# Patient Record
Sex: Male | Born: 1940 | Race: White | Hispanic: No | Marital: Married | Smoking: Never smoker
Health system: Southern US, Community
[De-identification: ages and names within clinical notes are randomized; demographics above are authoritative.]

## PROBLEM LIST (undated history)

## (undated) DIAGNOSIS — R233 Spontaneous ecchymoses: Secondary | ICD-10-CM

## (undated) DIAGNOSIS — R238 Other skin changes: Secondary | ICD-10-CM

## (undated) DIAGNOSIS — I10 Essential (primary) hypertension: Secondary | ICD-10-CM

## (undated) DIAGNOSIS — I639 Cerebral infarction, unspecified: Secondary | ICD-10-CM

## (undated) DIAGNOSIS — K219 Gastro-esophageal reflux disease without esophagitis: Secondary | ICD-10-CM

## (undated) DIAGNOSIS — F419 Anxiety disorder, unspecified: Secondary | ICD-10-CM

## (undated) DIAGNOSIS — I219 Acute myocardial infarction, unspecified: Secondary | ICD-10-CM

## (undated) HISTORY — DX: Acute myocardial infarction, unspecified: I21.9

## (undated) HISTORY — DX: Essential (primary) hypertension: I10

## (undated) HISTORY — DX: Spontaneous ecchymoses: R23.3

## (undated) HISTORY — DX: Cerebral infarction, unspecified: I63.9

## (undated) HISTORY — DX: Gastro-esophageal reflux disease without esophagitis: K21.9

## (undated) HISTORY — DX: Anxiety disorder, unspecified: F41.9

## (undated) HISTORY — PX: GALLBLADDER SURGERY: SHX652

## (undated) HISTORY — DX: Other skin changes: R23.8

---

## 2011-03-25 DIAGNOSIS — E119 Type 2 diabetes mellitus without complications: Secondary | ICD-10-CM | POA: Insufficient documentation

## 2011-03-25 DIAGNOSIS — F419 Anxiety disorder, unspecified: Secondary | ICD-10-CM

## 2011-03-25 DIAGNOSIS — H9190 Unspecified hearing loss, unspecified ear: Secondary | ICD-10-CM | POA: Insufficient documentation

## 2011-03-25 DIAGNOSIS — I10 Essential (primary) hypertension: Secondary | ICD-10-CM

## 2011-03-25 DIAGNOSIS — K219 Gastro-esophageal reflux disease without esophagitis: Secondary | ICD-10-CM

## 2013-05-17 ENCOUNTER — Ambulatory Visit: Payer: Self-pay

## 2013-05-31 ENCOUNTER — Ambulatory Visit (INDEPENDENT_AMBULATORY_CARE_PROVIDER_SITE_OTHER): Payer: Medicare Other

## 2013-05-31 VITALS — BP 123/71 | HR 103 | Resp 16

## 2013-05-31 DIAGNOSIS — E1142 Type 2 diabetes mellitus with diabetic polyneuropathy: Secondary | ICD-10-CM

## 2013-05-31 DIAGNOSIS — B351 Tinea unguium: Secondary | ICD-10-CM

## 2013-05-31 DIAGNOSIS — E1140 Type 2 diabetes mellitus with diabetic neuropathy, unspecified: Secondary | ICD-10-CM

## 2013-05-31 DIAGNOSIS — M79609 Pain in unspecified limb: Secondary | ICD-10-CM

## 2013-05-31 DIAGNOSIS — E1149 Type 2 diabetes mellitus with other diabetic neurological complication: Secondary | ICD-10-CM

## 2013-05-31 NOTE — Patient Instructions (Signed)

## 2013-05-31 NOTE — Progress Notes (Signed)
  Subjective:    Patient ID: Jared [name], male    DOB: October 17, 1940, 72 y.o.   MRN: [id number]  HPI trim nails    Review of Systems  Constitutional: Negative.   HENT: Negative.   Eyes: Negative.   Respiratory: Negative.   Cardiovascular: Negative.   Gastrointestinal: Negative.   Endocrine: Negative.   Genitourinary: Negative.   Musculoskeletal: Negative.   Skin: Negative.   Allergic/Immunologic: Negative.   Neurological: Negative.   Hematological: Negative.   Psychiatric/Behavioral: Negative.        Objective:   Physical Exam Neurovascular status unchanged pedal pulses palpable DP +2/4 PT + over 4 bilateral. Skin temperature warm. There is +1 to +2 edema the left no edema on the right this is a new occurrence however his blood pressure has been normal no history of injury or trauma to the left foot or leg. No complaints of pain no erythema no crepitus on range of motion there exam. Nails thick and hypertrophic friable and criptotic with discoloration and brittle. Nails tender on palpation and with enclosed shoe or debridement. Skin color pigment normal hair growth absent bilateral. No open wounds or ulcerations noted. Mild varicosities noted.     Assessment & Plan:  Diabetes with peripheral neuropathy. Onychomycosis with dystrophy of nails 1 through 5 bilateral. Nails tender on palpation and debridement. Mycotic nails debrided x10. Return in 3 months for followup palliative care is needed. As far as the edema left foot may recommendations for compression stockings, if he continues to progress followup with primary physician.  Alvan Dame DPM

## 2013-09-06 ENCOUNTER — Ambulatory Visit: Payer: Medicare Other

## 2014-02-21 ENCOUNTER — Ambulatory Visit (INDEPENDENT_AMBULATORY_CARE_PROVIDER_SITE_OTHER): Payer: Medicare Other

## 2014-02-21 VITALS — BP 136/69 | HR 72 | Resp 12

## 2014-02-21 DIAGNOSIS — M79606 Pain in leg, unspecified: Secondary | ICD-10-CM

## 2014-02-21 DIAGNOSIS — E1149 Type 2 diabetes mellitus with other diabetic neurological complication: Secondary | ICD-10-CM

## 2014-02-21 DIAGNOSIS — B351 Tinea unguium: Secondary | ICD-10-CM

## 2014-02-21 DIAGNOSIS — M79609 Pain in unspecified limb: Secondary | ICD-10-CM

## 2014-02-21 DIAGNOSIS — E1140 Type 2 diabetes mellitus with diabetic neuropathy, unspecified: Secondary | ICD-10-CM

## 2014-02-21 NOTE — Patient Instructions (Signed)
Diabetes and Foot Care Diabetes may cause you to have problems because of poor blood supply (circulation) to your feet and legs. This may cause the skin on your feet to become thinner, break easier, and heal more slowly. Your skin may become dry, and the skin may peel and crack. You may also have nerve damage in your legs and feet causing decreased feeling in them. You may not notice minor injuries to your feet that could lead to infections or more serious problems. Taking care of your feet is one of the most important things you can do for yourself.  HOME CARE INSTRUCTIONS  Wear shoes at all times, even in the house. Do not go barefoot. Bare feet are easily injured.  Check your feet daily for blisters, cuts, and redness. If you cannot see the bottom of your feet, use a mirror or ask someone for help.  Wash your feet with warm water (do not use hot water) and mild soap. Then pat your feet and the areas between your toes until they are completely dry. Do not soak your feet as this can dry your skin.  Apply a moisturizing lotion or petroleum jelly (that does not contain alcohol and is unscented) to the skin on your feet and to dry, brittle toenails. Do not apply lotion between your toes.  Trim your toenails straight across. Do not dig under them or around the cuticle. File the edges of your nails with an emery board or nail file.  Do not cut corns or calluses or try to remove them with medicine.  Wear clean socks or stockings every day. Make sure they are not too tight. Do not wear knee-high stockings since they may decrease blood flow to your legs.  Wear shoes that fit properly and have enough cushioning. To break in new shoes, wear them for just a few hours a day. This prevents you from injuring your feet. Always look in your shoes before you put them on to be sure there are no objects inside.  Do not cross your legs. This may decrease the blood flow to your feet.  If you find a minor scrape,  cut, or break in the skin on your feet, keep it and the skin around it clean and dry. These areas may be cleansed with mild soap and water. Do not cleanse the area with peroxide, alcohol, or iodine.  When you remove an adhesive bandage, be sure not to damage the skin around it.  If you have a wound, look at it several times a day to make sure it is healing.  Do not use heating pads or hot water bottles. They may burn your skin. If you have lost feeling in your feet or legs, you may not know it is happening until it is too late.  Make sure your health care provider performs a complete foot exam at least annually or more often if you have foot problems. Report any cuts, sores, or bruises to your health care provider immediately. SEEK MEDICAL CARE IF:   You have an injury that is not healing.  You have cuts or breaks in the skin.  You have an ingrown nail.  You notice redness on your legs or feet.  You feel burning or tingling in your legs or feet.  You have pain or cramps in your legs and feet.  Your legs or feet are numb.  Your feet always feel cold. SEEK IMMEDIATE MEDICAL CARE IF:   There is increasing redness,   swelling, or pain in or around a wound.  There is a red line that goes up your leg.  Pus is coming from a wound.  You develop a fever or as directed by your health care provider.  You notice a bad smell coming from an ulcer or wound. Document Released: 07/30/2000 Document Revised: 04/04/2013 Document Reviewed: 01/09/2013 Exit[employer] Patient Information 2015 [employer]. This information is not intended to replace advice given to you by your health care provider. Make sure you discuss any questions you have with your health care provider.  

## 2014-02-21 NOTE — Progress Notes (Signed)
   Subjective:    Patient ID: Jared [name], male    DOB: May 04, 1941, 73 y.o.   MRN: [id number]  HPI I need my toenails trimmed up    Review of Systems no new findings or systemic changes noted     Objective:   Physical Exam Lower extremity objective findings as follows vascular status is intact and unchanged pedal pulses DP +2/4 bilateral PT one over 4 bilateral capillary refill time 3 seconds all digits there is +1 mild edema noted both lower extremities. Minimal varicosities noted. Neurologically epicritic and proprioceptive sensations intact although diminished on Semmes Weinstein testing bilateral to the toes. There is normal plantar response DTRs not elicited dermatologically skin color pigment normal hair growth absent there is no rash noted around his ankle right ankle only medial lateral aspects of the ankle about 4-5 inches above the malleoli been there for couple of weeks mild pruritus no significant pain no discharge or drainage. Advised to try topicals cortisone cream OTC for week or 2 if no improvement recommend followup with dermatology or primary physician. Nails thick criptotic friable discolored 1 through 5 bilateral tender both on palpation and with enclosed shoe wear. No open wounds ulcerations no secondary infection is noted current time.       Assessment & Plan:  Assessment diabetes with history peripheral neuropathy as well as onychomycosis with dystrophy discoloration and friability of nails 1 through 5 bilateral painful tender mycotic nails are debrided and the presence of pain in symptomology as well as diabetes return for future palliative care as needed recommended 3 month followup  Alvan Dameichard Choua Ikner DPM

## 2014-05-16 ENCOUNTER — Ambulatory Visit: Payer: Medicare Other

## 2014-05-20 ENCOUNTER — Ambulatory Visit (INDEPENDENT_AMBULATORY_CARE_PROVIDER_SITE_OTHER): Payer: Medicare Other

## 2014-05-20 VITALS — BP 149/76 | HR 86 | Resp 12

## 2014-05-20 DIAGNOSIS — M79606 Pain in leg, unspecified: Secondary | ICD-10-CM

## 2014-05-20 DIAGNOSIS — B351 Tinea unguium: Secondary | ICD-10-CM

## 2014-05-20 DIAGNOSIS — E1140 Type 2 diabetes mellitus with diabetic neuropathy, unspecified: Secondary | ICD-10-CM

## 2014-05-20 NOTE — Patient Instructions (Signed)
Diabetes and Foot Care Diabetes may cause you to have problems because of poor blood supply (circulation) to your feet and legs. This may cause the skin on your feet to become thinner, break easier, and heal more slowly. Your skin may become dry, and the skin may peel and crack. You may also have nerve damage in your legs and feet causing decreased feeling in them. You may not notice minor injuries to your feet that could lead to infections or more serious problems. Taking care of your feet is one of the most important things you can do for yourself.  HOME CARE INSTRUCTIONS  Wear shoes at all times, even in the house. Do not go barefoot. Bare feet are easily injured.  Check your feet daily for blisters, cuts, and redness. If you cannot see the bottom of your feet, use a mirror or ask someone for help.  Wash your feet with warm water (do not use hot water) and mild soap. Then pat your feet and the areas between your toes until they are completely dry. Do not soak your feet as this can dry your skin.  Apply a moisturizing lotion or petroleum jelly (that does not contain alcohol and is unscented) to the skin on your feet and to dry, brittle toenails. Do not apply lotion between your toes.  Trim your toenails straight across. Do not dig under them or around the cuticle. File the edges of your nails with an emery board or nail file.  Do not cut corns or calluses or try to remove them with medicine.  Wear clean socks or stockings every day. Make sure they are not too tight. Do not wear knee-high stockings since they may decrease blood flow to your legs.  Wear shoes that fit properly and have enough cushioning. To break in new shoes, wear them for just a few hours a day. This prevents you from injuring your feet. Always look in your shoes before you put them on to be sure there are no objects inside.  Do not cross your legs. This may decrease the blood flow to your feet.  If you find a minor scrape,  cut, or break in the skin on your feet, keep it and the skin around it clean and dry. These areas may be cleansed with mild soap and water. Do not cleanse the area with peroxide, alcohol, or iodine.  When you remove an adhesive bandage, be sure not to damage the skin around it.  If you have a wound, look at it several times a day to make sure it is healing.  Do not use heating pads or hot water bottles. They may burn your skin. If you have lost feeling in your feet or legs, you may not know it is happening until it is too late.  Make sure your health care provider performs a complete foot exam at least annually or more often if you have foot problems. Report any cuts, sores, or bruises to your health care provider immediately. SEEK MEDICAL CARE IF:   You have an injury that is not healing.  You have cuts or breaks in the skin.  You have an ingrown nail.  You notice redness on your legs or feet.  You feel burning or tingling in your legs or feet.  You have pain or cramps in your legs and feet.  Your legs or feet are numb.  Your feet always feel cold. SEEK IMMEDIATE MEDICAL CARE IF:   There is increasing redness,   swelling, or pain in or around a wound.  There is a red line that goes up your leg.  Pus is coming from a wound.  You develop a fever or as directed by your health care provider.  You notice a bad smell coming from an ulcer or wound. Document Released: 07/30/2000 Document Revised: 04/04/2013 Document Reviewed: 01/09/2013 Exit[employer] Patient Information 2015 [employer]. This information is not intended to replace advice given to you by your health care provider. Make sure you discuss any questions you have with your health care provider.  

## 2014-05-20 NOTE — Progress Notes (Signed)
   Subjective:    Patient ID: Jared [name], male    DOB: [birth year], 73 y.o.   MRN: [id number]  HPI TOENAILS TRIM  AND CHECK FEET.   Review of Systems no new findings or systemic changes noted     Objective:   Physical Exam No new findings or changes vascular status reveals DP plus and PT plus one over 4 bilateral 3 seconds capillary refill time. Mild + edema bilateral mild varicosities noted epicritic sensation decreased on Semmes Weinstein to forefoot and digits. No plantar calluses no open wounds or ulcers no secondary infections nails thick criptotic incurvated brittle discolored consistent with onychomycosis and proptosis. Patient blood sugars the problem it was 140 this morning normally going under 120 general seems to be continued well-managed. No new findings or changes mild semirigid digital contractures noted 2 through 5 bilateral       Assessment & Plan:  Assessment diabetes history peripheral neuropathy as well as onychomycosis dystrophy friability of nails 1 through 5 bilateral painful mycotic nails debrided x10 the second toe left is treated with lumicain Neosporin and Band-Aid dressing a cane for the next 24 hours recheck in 3 months for long-term palliative care and followup as needed next  Alvan Dameichard Kaprice Kage DPM

## 2014-08-26 ENCOUNTER — Ambulatory Visit (INDEPENDENT_AMBULATORY_CARE_PROVIDER_SITE_OTHER): Payer: Medicare Other

## 2014-08-26 VITALS — BP 156/79 | HR 64 | Resp 12

## 2014-08-26 DIAGNOSIS — E1140 Type 2 diabetes mellitus with diabetic neuropathy, unspecified: Secondary | ICD-10-CM

## 2014-08-26 DIAGNOSIS — B351 Tinea unguium: Secondary | ICD-10-CM

## 2014-08-26 DIAGNOSIS — M79606 Pain in leg, unspecified: Secondary | ICD-10-CM

## 2014-08-26 DIAGNOSIS — M79676 Pain in unspecified toe(s): Secondary | ICD-10-CM

## 2014-08-26 NOTE — Progress Notes (Signed)
   Subjective:    Patient ID: Jared SalvageRonald Mette, male    DOB: 1941/04/04, 74 y.o.   MRN: [id number]  HPI TOENAILS TRIM.   Review of Systems no new findings or systemic changes noted    Objective:   Physical Exam Lower extremity objective findings unchanged pedal pulses DP and PT plus one over 4 bilateral Refill time 3 seconds mild +1 edema noted no open wounds no ulcers no secondary infections. Nails thick brittle criptotic incurvated friable 1 through 5 bilateral       Assessment & Plan:  Assessment this time diabetes with history peripheral neuropathy as well as dystrophy and discoloration friability mycosis of nails 1 through 5 bilateral painful mycotic nails debrided 10 return for future palliative care every 3 months as recommended  Alvan Dameichard Nakya Weyand DPM

## 2014-11-25 ENCOUNTER — Ambulatory Visit: Payer: Medicare Other

## 2015-05-26 ENCOUNTER — Ambulatory Visit (INDEPENDENT_AMBULATORY_CARE_PROVIDER_SITE_OTHER): Payer: Medicare Other | Admitting: Podiatry

## 2015-05-26 ENCOUNTER — Encounter: Payer: Self-pay | Admitting: Podiatry

## 2015-05-26 DIAGNOSIS — M79673 Pain in unspecified foot: Secondary | ICD-10-CM

## 2015-05-26 DIAGNOSIS — B351 Tinea unguium: Secondary | ICD-10-CM

## 2015-05-26 DIAGNOSIS — E1140 Type 2 diabetes mellitus with diabetic neuropathy, unspecified: Secondary | ICD-10-CM | POA: Diagnosis not present

## 2015-05-26 DIAGNOSIS — M79606 Pain in leg, unspecified: Secondary | ICD-10-CM

## 2015-05-26 NOTE — Progress Notes (Signed)
Patient ID: Jared [name], male   DOB: 02/24/1941, 74 y.o.   MRN: [id number] Complaint:  Visit Type: Patient returns to my office for continued preventative foot care services. Complaint: Patient states" my nails have grown long and thick and become painful to walk and wear shoes" Patient has been diagnosed with DM with no foot complications. The patient presents for preventative foot care services. No changes to ROS  Podiatric Exam: Vascular: dorsalis pedis and posterior tibial pulses are palpable bilateral. Capillary return is immediate. Temperature gradient is diminished.. Skin turgor WNL  Sensorium: Normal Semmes Weinstein monofilament test. Normal tactile sensation bilaterally. Nail Exam: Pt has thick disfigured discolored nails with subungual debris noted bilateral entire nail hallux through fifth toenails Ulcer Exam: There is no evidence of ulcer or pre-ulcerative changes or infection. Orthopedic Exam: Muscle tone and strength are WNL. No limitations in general ROM. No crepitus or effusions noted. Foot type and digits show no abnormalities. Bony prominences are unremarkable. Skin: No Porokeratosis. No infection or ulcers  Diagnosis:  Onychomycosis, , Pain in right toe, pain in left toes  Treatment & Plan Procedures and Treatment: Consent by patient was obtained for treatment procedures. The patient understood the discussion of treatment and procedures well. All questions were answered thoroughly reviewed. Debridement of mycotic and hypertrophic toenails, 1 through 5 bilateral and clearing of subungual debris. No ulceration, no infection noted.  Return Visit-Office Procedure: Patient instructed to return to the office for a follow up visit 3 months for continued evaluation and treatment.

## 2015-09-01 ENCOUNTER — Ambulatory Visit (INDEPENDENT_AMBULATORY_CARE_PROVIDER_SITE_OTHER): Payer: Medicare Other | Admitting: Podiatry

## 2015-09-01 ENCOUNTER — Encounter: Payer: Self-pay | Admitting: Podiatry

## 2015-09-01 DIAGNOSIS — M79606 Pain in leg, unspecified: Secondary | ICD-10-CM

## 2015-09-01 DIAGNOSIS — E1140 Type 2 diabetes mellitus with diabetic neuropathy, unspecified: Secondary | ICD-10-CM

## 2015-09-01 DIAGNOSIS — M79673 Pain in unspecified foot: Secondary | ICD-10-CM | POA: Diagnosis not present

## 2015-09-01 DIAGNOSIS — B351 Tinea unguium: Secondary | ICD-10-CM | POA: Diagnosis not present

## 2015-09-01 NOTE — Progress Notes (Signed)
Patient ID: Jared SalvageRonald Crite, male   DOB: 03-11-1941, 75 y.o.   MRN: [id number] Complaint:  Visit Type: Patient returns to my office for continued preventative foot care services. Complaint: Patient states" my nails have grown long and thick and become painful to walk and wear shoes" Patient has been diagnosed with DM with no foot complications. The patient presents for preventative foot care services. No changes to ROS  Podiatric Exam: Vascular: dorsalis pedis and posterior tibial pulses are palpable bilateral. Capillary return is immediate. Temperature gradient is diminished.. Skin turgor WNL  Sensorium: Normal Semmes Weinstein monofilament test. Normal tactile sensation bilaterally. Nail Exam: Pt has thick disfigured discolored nails with subungual debris noted bilateral entire nail hallux through fifth toenails Ulcer Exam: There is no evidence of ulcer or pre-ulcerative changes or infection. Orthopedic Exam: Muscle tone and strength are WNL. No limitations in general ROM. No crepitus or effusions noted. Foot type and digits show no abnormalities. Bony prominences are unremarkable. Skin: No Porokeratosis. No infection or ulcers  Diagnosis:  Onychomycosis, , Pain in right toe, pain in left toes  Treatment & Plan Procedures and Treatment: Consent by patient was obtained for treatment procedures. The patient understood the discussion of treatment and procedures well. All questions were answered thoroughly reviewed. Debridement of mycotic and hypertrophic toenails, 1 through 5 bilateral and clearing of subungual debris. No ulceration, no infection noted.  Return Visit-Office Procedure: Patient instructed to return to the office for a follow up visit 3 months for continued evaluation and treatment.   Helane GuntherGregory Etha Stambaugh DPM

## 2015-12-01 ENCOUNTER — Encounter: Payer: Self-pay | Admitting: Podiatry

## 2015-12-01 ENCOUNTER — Ambulatory Visit (INDEPENDENT_AMBULATORY_CARE_PROVIDER_SITE_OTHER): Payer: Medicare Other | Admitting: Podiatry

## 2015-12-01 DIAGNOSIS — B351 Tinea unguium: Secondary | ICD-10-CM

## 2015-12-01 DIAGNOSIS — E1140 Type 2 diabetes mellitus with diabetic neuropathy, unspecified: Secondary | ICD-10-CM

## 2015-12-01 DIAGNOSIS — M79606 Pain in leg, unspecified: Secondary | ICD-10-CM | POA: Diagnosis not present

## 2015-12-01 NOTE — Progress Notes (Signed)
Patient ID: [name], male   DOB: May 11, 1941, 75 y.o.   MRN: [id number] Complaint:  Visit Type: Patient returns to my office for continued preventative foot care services. Complaint: Patient states" my nails have grown long and thick and become painful to walk and wear shoes" Patient has been diagnosed with DM with no foot complications. The patient presents for preventative foot care services. No changes to ROS  Podiatric Exam: Vascular: dorsalis pedis and posterior tibial pulses are palpable bilateral. Capillary return is immediate. Temperature gradient is diminished.. Skin turgor WNL  Sensorium: Normal Semmes Weinstein monofilament test. Normal tactile sensation bilaterally. Nail Exam: Pt has thick disfigured discolored nails with subungual debris noted bilateral entire nail hallux through fifth toenails Ulcer Exam: There is no evidence of ulcer or pre-ulcerative changes or infection. Orthopedic Exam: Muscle tone and strength are WNL. No limitations in general ROM. No crepitus or effusions noted. Foot type and digits show no abnormalities. Bony prominences are unremarkable. Skin: No Porokeratosis. No infection or ulcers  Diagnosis:  Onychomycosis, , Pain in right toe, pain in left toes  Treatment & Plan Procedures and Treatment: Consent by patient was obtained for treatment procedures. The patient understood the discussion of treatment and procedures well. All questions were answered thoroughly reviewed. Debridement of mycotic and hypertrophic toenails, 1 through 5 bilateral and clearing of subungual debris. No ulceration, no infection noted.  Return Visit-Office Procedure: Patient instructed to return to the office for a follow up visit 3 months for continued evaluation and treatment.   Helane GuntherGregory Turner Kunzman DPM

## 2016-01-21 DIAGNOSIS — E1165 Type 2 diabetes mellitus with hyperglycemia: Secondary | ICD-10-CM

## 2016-01-21 DIAGNOSIS — I5032 Chronic diastolic (congestive) heart failure: Secondary | ICD-10-CM

## 2016-01-21 DIAGNOSIS — S72002A Fracture of unspecified part of neck of left femur, initial encounter for closed fracture: Secondary | ICD-10-CM | POA: Diagnosis not present

## 2016-01-21 DIAGNOSIS — N179 Acute kidney failure, unspecified: Secondary | ICD-10-CM

## 2016-01-21 DIAGNOSIS — F0390 Unspecified dementia without behavioral disturbance: Secondary | ICD-10-CM

## 2016-01-21 DIAGNOSIS — I10 Essential (primary) hypertension: Secondary | ICD-10-CM

## 2016-01-22 DIAGNOSIS — N179 Acute kidney failure, unspecified: Secondary | ICD-10-CM | POA: Diagnosis not present

## 2016-01-22 DIAGNOSIS — I5032 Chronic diastolic (congestive) heart failure: Secondary | ICD-10-CM

## 2016-01-22 DIAGNOSIS — E1165 Type 2 diabetes mellitus with hyperglycemia: Secondary | ICD-10-CM

## 2016-01-22 DIAGNOSIS — I10 Essential (primary) hypertension: Secondary | ICD-10-CM

## 2016-01-22 DIAGNOSIS — F0390 Unspecified dementia without behavioral disturbance: Secondary | ICD-10-CM | POA: Diagnosis not present

## 2016-01-22 DIAGNOSIS — S72002A Fracture of unspecified part of neck of left femur, initial encounter for closed fracture: Secondary | ICD-10-CM

## 2016-01-23 DIAGNOSIS — I10 Essential (primary) hypertension: Secondary | ICD-10-CM | POA: Diagnosis not present

## 2016-01-23 DIAGNOSIS — N179 Acute kidney failure, unspecified: Secondary | ICD-10-CM | POA: Diagnosis not present

## 2016-01-23 DIAGNOSIS — J9602 Acute respiratory failure with hypercapnia: Secondary | ICD-10-CM

## 2016-01-23 DIAGNOSIS — I5032 Chronic diastolic (congestive) heart failure: Secondary | ICD-10-CM | POA: Diagnosis not present

## 2016-01-23 DIAGNOSIS — F0390 Unspecified dementia without behavioral disturbance: Secondary | ICD-10-CM | POA: Diagnosis not present

## 2016-01-24 ENCOUNTER — Inpatient Hospital Stay (HOSPITAL_COMMUNITY): Payer: Medicare Other

## 2016-01-24 ENCOUNTER — Inpatient Hospital Stay (HOSPITAL_COMMUNITY)
Admission: AD | Admit: 2016-01-24 | Discharge: 2016-02-11 | DRG: 208 | Disposition: A | Discharge status: Hospice | Payer: Medicare Other | Source: Other Acute Inpatient Hospital | Attending: Internal Medicine | Admitting: Internal Medicine

## 2016-01-24 DIAGNOSIS — E861 Hypovolemia: Secondary | ICD-10-CM | POA: Diagnosis not present

## 2016-01-24 DIAGNOSIS — Y95 Nosocomial condition: Secondary | ICD-10-CM | POA: Diagnosis not present

## 2016-01-24 DIAGNOSIS — R0602 Shortness of breath: Secondary | ICD-10-CM

## 2016-01-24 DIAGNOSIS — J9601 Acute respiratory failure with hypoxia: Secondary | ICD-10-CM | POA: Diagnosis not present

## 2016-01-24 DIAGNOSIS — I5033 Acute on chronic diastolic (congestive) heart failure: Secondary | ICD-10-CM | POA: Diagnosis present

## 2016-01-24 DIAGNOSIS — R509 Fever, unspecified: Secondary | ICD-10-CM

## 2016-01-24 DIAGNOSIS — I110 Hypertensive heart disease with heart failure: Secondary | ICD-10-CM | POA: Diagnosis not present

## 2016-01-24 DIAGNOSIS — E875 Hyperkalemia: Secondary | ICD-10-CM

## 2016-01-24 DIAGNOSIS — I69354 Hemiplegia and hemiparesis following cerebral infarction affecting left non-dominant side: Secondary | ICD-10-CM | POA: Diagnosis not present

## 2016-01-24 DIAGNOSIS — R0600 Dyspnea, unspecified: Secondary | ICD-10-CM | POA: Insufficient documentation

## 2016-01-24 DIAGNOSIS — T502X5A Adverse effect of carbonic-anhydrase inhibitors, benzothiadiazides and other diuretics, initial encounter: Secondary | ICD-10-CM | POA: Diagnosis present

## 2016-01-24 DIAGNOSIS — Z9911 Dependence on respirator [ventilator] status: Secondary | ICD-10-CM | POA: Diagnosis not present

## 2016-01-24 DIAGNOSIS — N5089 Other specified disorders of the male genital organs: Secondary | ICD-10-CM | POA: Diagnosis present

## 2016-01-24 DIAGNOSIS — E872 Acidosis: Secondary | ICD-10-CM | POA: Diagnosis present

## 2016-01-24 DIAGNOSIS — Z8249 Family history of ischemic heart disease and other diseases of the circulatory system: Secondary | ICD-10-CM

## 2016-01-24 DIAGNOSIS — R1310 Dysphagia, unspecified: Secondary | ICD-10-CM | POA: Diagnosis present

## 2016-01-24 DIAGNOSIS — J189 Pneumonia, unspecified organism: Secondary | ICD-10-CM | POA: Diagnosis not present

## 2016-01-24 DIAGNOSIS — E1165 Type 2 diabetes mellitus with hyperglycemia: Secondary | ICD-10-CM | POA: Diagnosis present

## 2016-01-24 DIAGNOSIS — I252 Old myocardial infarction: Secondary | ICD-10-CM | POA: Diagnosis not present

## 2016-01-24 DIAGNOSIS — J9690 Respiratory failure, unspecified, unspecified whether with hypoxia or hypercapnia: Secondary | ICD-10-CM

## 2016-01-24 DIAGNOSIS — Z4659 Encounter for fitting and adjustment of other gastrointestinal appliance and device: Secondary | ICD-10-CM

## 2016-01-24 DIAGNOSIS — Z87891 Personal history of nicotine dependence: Secondary | ICD-10-CM | POA: Diagnosis not present

## 2016-01-24 DIAGNOSIS — A419 Sepsis, unspecified organism: Secondary | ICD-10-CM | POA: Diagnosis not present

## 2016-01-24 DIAGNOSIS — Z515 Encounter for palliative care: Secondary | ICD-10-CM | POA: Diagnosis not present

## 2016-01-24 DIAGNOSIS — E46 Unspecified protein-calorie malnutrition: Secondary | ICD-10-CM | POA: Diagnosis present

## 2016-01-24 DIAGNOSIS — F0390 Unspecified dementia without behavioral disturbance: Secondary | ICD-10-CM

## 2016-01-24 DIAGNOSIS — I959 Hypotension, unspecified: Secondary | ICD-10-CM | POA: Diagnosis present

## 2016-01-24 DIAGNOSIS — F329 Major depressive disorder, single episode, unspecified: Secondary | ICD-10-CM | POA: Diagnosis present

## 2016-01-24 DIAGNOSIS — Z880 Allergy status to penicillin: Secondary | ICD-10-CM | POA: Diagnosis not present

## 2016-01-24 DIAGNOSIS — Z794 Long term (current) use of insulin: Secondary | ICD-10-CM | POA: Diagnosis not present

## 2016-01-24 DIAGNOSIS — R197 Diarrhea, unspecified: Secondary | ICD-10-CM | POA: Diagnosis not present

## 2016-01-24 DIAGNOSIS — I5032 Chronic diastolic (congestive) heart failure: Secondary | ICD-10-CM | POA: Diagnosis not present

## 2016-01-24 DIAGNOSIS — E860 Dehydration: Secondary | ICD-10-CM | POA: Diagnosis not present

## 2016-01-24 DIAGNOSIS — Z7401 Bed confinement status: Secondary | ICD-10-CM

## 2016-01-24 DIAGNOSIS — F419 Anxiety disorder, unspecified: Secondary | ICD-10-CM | POA: Diagnosis present

## 2016-01-24 DIAGNOSIS — E870 Hyperosmolality and hypernatremia: Secondary | ICD-10-CM | POA: Diagnosis present

## 2016-01-24 DIAGNOSIS — K219 Gastro-esophageal reflux disease without esophagitis: Secondary | ICD-10-CM | POA: Diagnosis not present

## 2016-01-24 DIAGNOSIS — E0865 Diabetes mellitus due to underlying condition with hyperglycemia: Secondary | ICD-10-CM | POA: Diagnosis not present

## 2016-01-24 DIAGNOSIS — I509 Heart failure, unspecified: Secondary | ICD-10-CM

## 2016-01-24 DIAGNOSIS — J690 Pneumonitis due to inhalation of food and vomit: Secondary | ICD-10-CM | POA: Diagnosis present

## 2016-01-24 DIAGNOSIS — H9190 Unspecified hearing loss, unspecified ear: Secondary | ICD-10-CM | POA: Diagnosis present

## 2016-01-24 DIAGNOSIS — Z66 Do not resuscitate: Secondary | ICD-10-CM | POA: Diagnosis present

## 2016-01-24 DIAGNOSIS — Z23 Encounter for immunization: Secondary | ICD-10-CM | POA: Diagnosis not present

## 2016-01-24 DIAGNOSIS — I10 Essential (primary) hypertension: Secondary | ICD-10-CM | POA: Diagnosis not present

## 2016-01-24 DIAGNOSIS — R502 Drug induced fever: Secondary | ICD-10-CM

## 2016-01-24 DIAGNOSIS — J811 Chronic pulmonary edema: Secondary | ICD-10-CM

## 2016-01-24 DIAGNOSIS — F0150 Vascular dementia without behavioral disturbance: Secondary | ICD-10-CM | POA: Diagnosis present

## 2016-01-24 DIAGNOSIS — Z7189 Other specified counseling: Secondary | ICD-10-CM | POA: Insufficient documentation

## 2016-01-24 DIAGNOSIS — N179 Acute kidney failure, unspecified: Secondary | ICD-10-CM | POA: Diagnosis present

## 2016-01-24 DIAGNOSIS — J810 Acute pulmonary edema: Secondary | ICD-10-CM | POA: Diagnosis not present

## 2016-01-24 DIAGNOSIS — Z79899 Other long term (current) drug therapy: Secondary | ICD-10-CM

## 2016-01-24 DIAGNOSIS — S72002D Fracture of unspecified part of neck of left femur, subsequent encounter for closed fracture with routine healing: Secondary | ICD-10-CM | POA: Diagnosis not present

## 2016-01-24 DIAGNOSIS — J9600 Acute respiratory failure, unspecified whether with hypoxia or hypercapnia: Secondary | ICD-10-CM

## 2016-01-24 DIAGNOSIS — J9621 Acute and chronic respiratory failure with hypoxia: Principal | ICD-10-CM | POA: Diagnosis present

## 2016-01-24 DIAGNOSIS — R451 Restlessness and agitation: Secondary | ICD-10-CM | POA: Diagnosis not present

## 2016-01-24 DIAGNOSIS — R5084 Febrile nonhemolytic transfusion reaction: Secondary | ICD-10-CM

## 2016-01-24 DIAGNOSIS — Z01818 Encounter for other preprocedural examination: Secondary | ICD-10-CM

## 2016-01-24 LAB — COMPREHENSIVE METABOLIC PANEL
ALBUMIN: 2.5 g/dL — AB (ref 3.5–5.0)
ALT: 11 U/L — ABNORMAL LOW (ref 17–63)
ANION GAP: 7 (ref 5–15)
AST: 12 U/L — ABNORMAL LOW (ref 15–41)
Alkaline Phosphatase: 46 U/L (ref 38–126)
BILIRUBIN TOTAL: 0.6 mg/dL (ref 0.3–1.2)
BUN: 61 mg/dL — ABNORMAL HIGH (ref 6–20)
CHLORIDE: 116 mmol/L — AB (ref 101–111)
CO2: 20 mmol/L — AB (ref 22–32)
CREATININE: 4.08 mg/dL — AB (ref 0.61–1.24)
Calcium: 8.1 mg/dL — ABNORMAL LOW (ref 8.9–10.3)
GFR, EST AFRICAN AMERICAN: 15 mL/min — AB (ref >60)
GFR, EST NON AFRICAN AMERICAN: 13 mL/min — AB (ref >60)
Glucose, Bld: 126 mg/dL — ABNORMAL HIGH (ref 65–99)
POTASSIUM: 5.2 mmol/L — AB (ref 3.5–5.1)
Sodium: 143 mmol/L (ref 135–145)
Total Protein: 5.9 g/dL — ABNORMAL LOW (ref 6.5–8.1)

## 2016-01-24 LAB — CBC
HCT: 31.7 % — ABNORMAL LOW (ref 39.0–52.0)
HEMOGLOBIN: 9.5 g/dL — AB (ref 13.0–17.0)
MCH: 29.8 pg (ref 26.0–34.0)
MCHC: 30 g/dL (ref 30.0–36.0)
MCV: 99.4 fL (ref 78.0–100.0)
PLATELETS: 149 10*3/uL — AB (ref 150–400)
RBC: 3.19 MIL/uL — AB (ref 4.22–5.81)
RDW: 16.1 % — ABNORMAL HIGH (ref 11.5–15.5)
WBC: 11.1 10*3/uL — AB (ref 4.0–10.5)

## 2016-01-24 LAB — GLUCOSE, CAPILLARY
GLUCOSE-CAPILLARY: 129 mg/dL — AB (ref 65–99)
Glucose-Capillary: 128 mg/dL — ABNORMAL HIGH (ref 65–99)

## 2016-01-24 LAB — PHOSPHORUS: PHOSPHORUS: 5.5 mg/dL — AB (ref 2.5–4.6)

## 2016-01-24 LAB — BRAIN NATRIURETIC PEPTIDE: B NATRIURETIC PEPTIDE 5: 243.6 pg/mL — AB (ref 0.0–100.0)

## 2016-01-24 LAB — MAGNESIUM: MAGNESIUM: 1.9 mg/dL (ref 1.7–2.4)

## 2016-01-24 MED ORDER — VITAL HIGH PROTEIN PO LIQD
1000.0000 mL | ORAL | Status: DC
  Administered 2016-01-24 – 2016-01-25 (×2): 1000 mL
  Administered 2016-01-26: 11:00:00
  Filled 2016-01-24 (×2): qty 1000

## 2016-01-24 MED ORDER — PHENYLEPHRINE HCL 10 MG/ML IJ SOLN
30.0000 ug/min | INTRAVENOUS | Status: DC
  Filled 2016-01-24: qty 1

## 2016-01-24 MED ORDER — FENTANYL CITRATE (PF) 100 MCG/2ML IJ SOLN
50.0000 ug | INTRAMUSCULAR | Status: DC | PRN
  Administered 2016-01-24 – 2016-01-26 (×5): 50 ug via INTRAVENOUS
  Filled 2016-01-24 (×6): qty 2

## 2016-01-24 MED ORDER — IMIPENEM-CILASTATIN 250 MG IV SOLR
250.0000 mg | Freq: Two times a day (BID) | INTRAVENOUS | Status: DC
  Administered 2016-01-25 – 2016-01-26 (×3): 250 mg via INTRAVENOUS
  Filled 2016-01-24 (×4): qty 250

## 2016-01-24 MED ORDER — PANTOPRAZOLE SODIUM 40 MG PO PACK
40.0000 mg | PACK | Freq: Every day | ORAL | Status: DC
  Administered 2016-01-24 – 2016-01-28 (×5): 40 mg
  Filled 2016-01-24 (×6): qty 20

## 2016-01-24 MED ORDER — PRO-STAT SUGAR FREE PO LIQD
30.0000 mL | Freq: Two times a day (BID) | ORAL | Status: DC
  Administered 2016-01-24 – 2016-01-26 (×4): 30 mL
  Filled 2016-01-24 (×5): qty 30

## 2016-01-24 MED ORDER — SODIUM CHLORIDE 0.9 % IV SOLN
500.0000 mg | Freq: Once | INTRAVENOUS | Status: AC
  Administered 2016-01-24: 500 mg via INTRAVENOUS
  Filled 2016-01-24: qty 500

## 2016-01-24 MED ORDER — IPRATROPIUM-ALBUTEROL 0.5-2.5 (3) MG/3ML IN SOLN
3.0000 mL | Freq: Four times a day (QID) | RESPIRATORY_TRACT | Status: DC
  Administered 2016-01-24 (×2): 3 mL via RESPIRATORY_TRACT
  Filled 2016-01-24 (×2): qty 3

## 2016-01-24 MED ORDER — VANCOMYCIN HCL 10 G IV SOLR
2000.0000 mg | Freq: Once | INTRAVENOUS | Status: AC
  Administered 2016-01-24: 2000 mg via INTRAVENOUS
  Filled 2016-01-24: qty 2000

## 2016-01-24 MED ORDER — ANTISEPTIC ORAL RINSE SOLUTION (CORINZ)
7.0000 mL | Freq: Four times a day (QID) | OROMUCOSAL | Status: DC
  Administered 2016-01-25 (×2): 7 mL via OROMUCOSAL

## 2016-01-24 MED ORDER — HEPARIN SODIUM (PORCINE) 5000 UNIT/ML IJ SOLN
5000.0000 [IU] | Freq: Three times a day (TID) | INTRAMUSCULAR | Status: DC
  Administered 2016-01-24 – 2016-02-11 (×52): 5000 [IU] via SUBCUTANEOUS
  Filled 2016-01-24 (×47): qty 1

## 2016-01-24 MED ORDER — FUROSEMIDE 10 MG/ML IJ SOLN
60.0000 mg | Freq: Once | INTRAMUSCULAR | Status: AC
  Administered 2016-01-24: 60 mg via INTRAVENOUS
  Filled 2016-01-24: qty 6

## 2016-01-24 MED ORDER — FENTANYL CITRATE (PF) 100 MCG/2ML IJ SOLN
50.0000 ug | INTRAMUSCULAR | Status: DC | PRN
  Administered 2016-01-24: 50 ug via INTRAVENOUS

## 2016-01-24 MED ORDER — CHLORHEXIDINE GLUCONATE 0.12% ORAL RINSE (MEDLINE KIT)
15.0000 mL | Freq: Two times a day (BID) | OROMUCOSAL | Status: DC
  Administered 2016-01-24 – 2016-01-25 (×2): 15 mL via OROMUCOSAL

## 2016-01-24 NOTE — Progress Notes (Signed)
RT note-rate increased per Dr. Kendrick FriesMcQuaid

## 2016-01-24 NOTE — H&P (Signed)
PULMONARY / CRITICAL CARE MEDICINE   Name: Jared SalvageRonald Holsopple MRN: [id number] DOB: [birth year]    ADMISSION DATE:  01/24/2016  REFERRING MD:  [facility]   CHIEF COMPLAINT:  Acute respiratory failure, AKI   HISTORY OF PRESENT ILLNESS:  75yo male with hx dementia, DM, previous CVA with residual L sided weakness [facility] after a fall at home with L femoral neck fx which was surgically repaired.  Post op patient was unable to wean from vent.  Pt had progressive renal failure, volume overload and was tx to [facility] 6/10 for ongoing care and renal eval.    PAST MEDICAL HISTORY :  He  has a past medical history of High blood pressure; GERD (gastroesophageal reflux disease); Bruises easily; Anxiety; Hearing loss; Diabetes mellitus; Stroke [facility](HCC); and Heart attack (HCC).  PAST SURGICAL HISTORY: He  has past surgical history that includes Gallbladder surgery.  Allergies  Allergen Reactions  . Penicillins     No current facility-administered medications on file prior to encounter.   Current Outpatient Prescriptions on File Prior to Encounter  Medication Sig  . ALPRAZolam (XANAX) 0.5 MG tablet Take 0.5 mg by mouth at bedtime as needed.    . Aspirin-Dipyridamole (AGGRENOX PO) Take by mouth.    . citalopram (CELEXA) 10 MG tablet Take 10 mg by mouth daily.    . ENALAPRIL MALEATE PO Take by mouth.    Marland Kitchen. glimepiride (AMARYL) 4 MG tablet Take 4 mg by mouth daily before breakfast.    . METFORMIN HCL PO Take by mouth.    . metoprolol tartrate (LOPRESSOR) 25 MG tablet Take 25 mg by mouth 2 (two) times daily.    . niacin (NIASPAN) 500 MG CR tablet Take 500 mg by mouth at bedtime.    Marland Kitchen. PANTOPRAZOLE SODIUM PO Take by mouth.    Marland Kitchen. PRAVASTATIN SODIUM PO Take by mouth.    . QUEtiapine (SEROQUEL) 300 MG tablet Take 300 mg by mouth at bedtime.    . TAMSULOSIN HCL PO Take by mouth.     FAMILY HISTORY:  Family hx HTN   SOCIAL HISTORY: Former smoker, quit 2002   REVIEW OF  SYSTEMS:   As per HPI - All other systems reviewed and were neg.    SUBJECTIVE:    VITAL SIGNS: BP 107/58 mmHg  Pulse 78  Temp(Src) 100.3 F (37.9 C) (Oral)  Resp 16  Ht 6' (1.829 m)  SpO2 98%  HEMODYNAMICS:    VENTILATOR SETTINGS: Vent Mode:  [-] PRVC FiO2 (%):  [50 %] 50 % Set Rate:  [16 bmp-18 bmp] 18 bmp Vt Set:  [600 mL] 600 mL PEEP:  [5 cmH20] 5 cmH20 Plateau Pressure:  [22 cmH20] 22 cmH20  INTAKE / OUTPUT:    PHYSICAL EXAMINATION: General:  Elderly, chronically ill appearing male, NAD on vent  Neuro:  Sedated on vent, RASS -1, opens eyes to name, follows commands intermittently  HEENT:  Mm moist, no JVD, ETT  Cardiovascular:  s1s2 rrr Lungs:  resps even non labored on vent, few bibasilar crackles Abdomen:  Round, soft, +bs  Musculoskeletal:  Warm and dry, no edema   LABS:  BMET No results for input(s): NA, K, CL, CO2, BUN, CREATININE, GLUCOSE in the last 168 hours.  Electrolytes No results for input(s): CALCIUM, MG, PHOS in the last 168 hours.  CBC No results for input(s): WBC, HGB, HCT, PLT in the last 168 hours.  Coag's No results for input(s): APTT, INR in the last 168  hours.  Sepsis Markers No results for input(s): LATICACIDVEN, PROCALCITON, O2SATVEN in the last 168 hours.  ABG No results for input(s): PHART, PCO2ART, PO2ART in the last 168 hours.  Liver Enzymes No results for input(s): AST, ALT, ALKPHOS, BILITOT, ALBUMIN in the last 168 hours.  Cardiac Enzymes No results for input(s): TROPONINI, PROBNP in the last 168 hours.  Glucose No results for input(s): GLUCAP in the last 168 hours.  Imaging No results found.   STUDIES:    CULTURES: BC x 2 6/10>>>  Urine 6/10>>>  ANTIBIOTICS: Vanc 6/10>>> Imipenem 6/10>>>  SIGNIFICANT EVENTS: 6/8>>>Surgical repair L hip fx  6/10>> tx [facility]   LINES/TUBES: ETT 6/8>>>   DISCUSSION: 75yo male with hx dementia admitted to [facility] after a fall at home with L hip fx.  Unable to wean  from vent post op.  Course c/b acute renal failure, volume overload.    ASSESSMENT / PLAN:  PULMONARY Acute respiratory failure  Pulmonary edema  P:   Vent support - 8cc/kg  F/u CXR  F/u ABG  Attempt diuresis as below  BD's   CARDIOVASCULAR Acute on chronic CHF  Hypotension  P:  Echo now  Attempt diuresis -- lasix  IV once now  Add low dose neo gtt if needed to allow diuresis  BNP    RENAL AKI  Hyperkalemia - mild  P:   Repeat chem now  Monitor closely with attempts at diuresis  If worsening Scr or no effective diuresis with lasix will consult renal in am   GASTROINTESTINAL No active issue  P:   PPI  TF   HEMATOLOGIC No active issue - labs pending  P:  Cbc  SQ heparin    INFECTIOUS ?HCAP  PCN allergic  P:   Broad spectrum abx  - vanc, imipenem  Pct  BC, urine culture   ENDOCRINE Hyperglycemia    P:   Monitor glucose on chem  May need SSI   NEUROLOGIC Sedation needs on vent  Hx dementia  P:   RASS goal: -1 PRN fentanyl    FAMILY  - Updates: no family available 76/10  - Inter-disciplinary family meet or Palliative Care meeting due by:  6/17    Dirk Dress, NP 01/24/2016  4:31 PM Pager: [phone] or [phone]

## 2016-01-25 ENCOUNTER — Inpatient Hospital Stay (HOSPITAL_COMMUNITY): Payer: Medicare Other

## 2016-01-25 DIAGNOSIS — R0600 Dyspnea, unspecified: Secondary | ICD-10-CM

## 2016-01-25 LAB — ECHOCARDIOGRAM COMPLETE
EWDT: 158 ms
FS: 33 % (ref 28–44)
Height: 72 in
IV/PV OW: 1.05
LA ID, A-P, ES: 39 mm
LA diam index: 1.74 cm/m2
LA vol A4C: 44.9 ml
LA vol index: 19.9 mL/m2
LA vol: 44.5 mL
LEFT ATRIUM END SYS DIAM: 39 mm
LVOT area: 3.8 cm2
LVOT diameter: 22 mm
MV Dec: 158
MV Peak grad: 3 mmHg
MVPKAVEL: 53.3 m/s
MVPKEVEL: 90.6 m/s
PW: 9.91 mm — AB (ref 0.6–1.1)
TAPSE: 18.3 mm
Weight: 3615.54 oz

## 2016-01-25 LAB — BLOOD GAS, ARTERIAL
ACID-BASE DEFICIT: 10.2 mmol/L — AB (ref 0.0–2.0)
BICARBONATE: 15.6 meq/L — AB (ref 20.0–24.0)
Drawn by: 398661
FIO2: 0.4
LHR: 18 {breaths}/min
O2 SAT: 90.6 %
PCO2 ART: 40 mmHg (ref 35.0–45.0)
PEEP: 5 cmH2O
Patient temperature: 101.5
TCO2: 16.8 mmol/L (ref 0–100)
VT: 600 mL
pH, Arterial: 7.228 — ABNORMAL LOW (ref 7.350–7.450)
pO2, Arterial: 75.8 mmHg — ABNORMAL LOW (ref 80.0–100.0)

## 2016-01-25 LAB — GLUCOSE, CAPILLARY
GLUCOSE-CAPILLARY: 309 mg/dL — AB (ref 65–99)
GLUCOSE-CAPILLARY: 299 mg/dL — AB (ref 65–99)
Glucose-Capillary: 196 mg/dL — ABNORMAL HIGH (ref 65–99)
Glucose-Capillary: 273 mg/dL — ABNORMAL HIGH (ref 65–99)
Glucose-Capillary: 293 mg/dL — ABNORMAL HIGH (ref 65–99)
Glucose-Capillary: 309 mg/dL — ABNORMAL HIGH (ref 65–99)
Glucose-Capillary: 327 mg/dL — ABNORMAL HIGH (ref 65–99)

## 2016-01-25 LAB — MAGNESIUM
MAGNESIUM: 1.9 mg/dL (ref 1.7–2.4)
Magnesium: 1.9 mg/dL (ref 1.7–2.4)

## 2016-01-25 LAB — BASIC METABOLIC PANEL
ANION GAP: 10 (ref 5–15)
BUN: 68 mg/dL — ABNORMAL HIGH (ref 6–20)
CALCIUM: 8.5 mg/dL — AB (ref 8.9–10.3)
CHLORIDE: 116 mmol/L — AB (ref 101–111)
CO2: 17 mmol/L — AB (ref 22–32)
Creatinine, Ser: 3.89 mg/dL — ABNORMAL HIGH (ref 0.61–1.24)
GFR calc non Af Amer: 14 mL/min — ABNORMAL LOW (ref >60)
GFR, EST AFRICAN AMERICAN: 16 mL/min — AB (ref >60)
Glucose, Bld: 292 mg/dL — ABNORMAL HIGH (ref 65–99)
POTASSIUM: 5.4 mmol/L — AB (ref 3.5–5.1)
Sodium: 143 mmol/L (ref 135–145)

## 2016-01-25 LAB — CBC
HEMATOCRIT: 31.8 % — AB (ref 39.0–52.0)
HEMOGLOBIN: 9.6 g/dL — AB (ref 13.0–17.0)
MCH: 29.4 pg (ref 26.0–34.0)
MCHC: 30.2 g/dL (ref 30.0–36.0)
MCV: 97.5 fL (ref 78.0–100.0)
Platelets: 150 10*3/uL (ref 150–400)
RBC: 3.26 MIL/uL — AB (ref 4.22–5.81)
RDW: 16.2 % — ABNORMAL HIGH (ref 11.5–15.5)
WBC: 10.8 10*3/uL — ABNORMAL HIGH (ref 4.0–10.5)

## 2016-01-25 LAB — MRSA PCR SCREENING: MRSA by PCR: NEGATIVE

## 2016-01-25 LAB — PHOSPHORUS
Phosphorus: 5.4 mg/dL — ABNORMAL HIGH (ref 2.5–4.6)
Phosphorus: 3.7 mg/dL (ref 2.5–4.6)

## 2016-01-25 MED ORDER — ACETAMINOPHEN 160 MG/5ML PO SOLN
650.0000 mg | ORAL | Status: DC | PRN
Start: 2016-01-25 — End: 2016-02-01
  Administered 2016-01-25: 650 mg
  Filled 2016-01-25 (×2): qty 20.3

## 2016-01-25 MED ORDER — IPRATROPIUM-ALBUTEROL 0.5-2.5 (3) MG/3ML IN SOLN
3.0000 mL | Freq: Four times a day (QID) | RESPIRATORY_TRACT | Status: DC
  Administered 2016-01-25 – 2016-01-27 (×9): 3 mL via RESPIRATORY_TRACT
  Filled 2016-01-25 (×9): qty 3

## 2016-01-25 MED ORDER — METOPROLOL TARTRATE 25 MG/10 ML ORAL SUSPENSION
12.5000 mg | Freq: Every morning | ORAL | Status: DC
  Administered 2016-01-25 – 2016-01-28 (×4): 12.5 mg
  Filled 2016-01-25 (×4): qty 5

## 2016-01-25 MED ORDER — CHLORHEXIDINE GLUCONATE 0.12% ORAL RINSE (MEDLINE KIT)
15.0000 mL | Freq: Two times a day (BID) | OROMUCOSAL | Status: DC
  Administered 2016-01-25 – 2016-01-26 (×3): 15 mL via OROMUCOSAL

## 2016-01-25 MED ORDER — INSULIN ASPART 100 UNIT/ML ~~LOC~~ SOLN
0.0000 [IU] | SUBCUTANEOUS | Status: DC
  Administered 2016-01-25: 7 [IU] via SUBCUTANEOUS
  Administered 2016-01-25: 5 [IU] via SUBCUTANEOUS
  Administered 2016-01-25 (×2): 7 [IU] via SUBCUTANEOUS
  Administered 2016-01-25 (×2): 5 [IU] via SUBCUTANEOUS
  Administered 2016-01-26: 3 [IU] via SUBCUTANEOUS
  Administered 2016-01-26: 9 [IU] via SUBCUTANEOUS
  Administered 2016-01-26: 3 [IU] via SUBCUTANEOUS
  Administered 2016-01-26 (×2): 5 [IU] via SUBCUTANEOUS
  Administered 2016-01-27: 3 [IU] via SUBCUTANEOUS
  Administered 2016-01-27: 2 [IU] via SUBCUTANEOUS
  Administered 2016-01-27: 3 [IU] via SUBCUTANEOUS
  Administered 2016-01-27: 2 [IU] via SUBCUTANEOUS
  Administered 2016-01-27: 5 [IU] via SUBCUTANEOUS
  Administered 2016-01-27: 7 [IU] via SUBCUTANEOUS
  Administered 2016-01-28: 3 [IU] via SUBCUTANEOUS
  Administered 2016-01-28: 5 [IU] via SUBCUTANEOUS
  Administered 2016-01-28: 7 [IU] via SUBCUTANEOUS

## 2016-01-25 MED ORDER — PNEUMOCOCCAL VAC POLYVALENT 25 MCG/0.5ML IJ INJ
0.5000 mL | INJECTION | INTRAMUSCULAR | Status: AC
  Administered 2016-01-26: 0.5 mL via INTRAMUSCULAR
  Filled 2016-01-25: qty 0.5

## 2016-01-25 MED ORDER — ANTISEPTIC ORAL RINSE SOLUTION (CORINZ)
7.0000 mL | OROMUCOSAL | Status: DC
  Administered 2016-01-25 – 2016-01-26 (×12): 7 mL via OROMUCOSAL

## 2016-01-25 MED ORDER — PERFLUTREN LIPID MICROSPHERE
1.0000 mL | INTRAVENOUS | Status: AC | PRN
  Administered 2016-01-25: 5 mL via INTRAVENOUS
  Filled 2016-01-25: qty 10

## 2016-01-25 NOTE — Progress Notes (Signed)
PULMONARY / CRITICAL CARE MEDICINE   Name: Jared SalvageRonald Margulies MRN: [id number] DOB: September 13, 1940    ADMISSION DATE:  01/24/2016  REFERRING MD:  [facility]   CHIEF COMPLAINT:  Acute respiratory failure, AKI   BRIEF:  75yo male with hx dementia, DM, previous CVA with residual L sided weakness initially [facility] 6[facility] after a fall at home with L femoral neck fx which was surgically repaired.  Post op patient was unable to wean from vent.  Pt had progressive renal failure, volume overload and was tx to [facility] 6/10 for ongoing care and renal eval.    SUBJECTIVE:  Making urine   VITAL SIGNS: BP 136/77 mmHg  Pulse 106  Temp(Src) 100.4 F (38 C) (Oral)  Resp 19  Ht 6' (1.829 m)  Wt 102.5 kg (225 lb 15.5 oz)  BMI 30.64 kg/m2  SpO2 98%  HEMODYNAMICS:    VENTILATOR SETTINGS: Vent Mode:  [-] PSV;CPAP FiO2 (%):  [40 %-50 %] 40 % Set Rate:  [16 bmp-24 bmp] 24 bmp Vt Set:  [600 mL] 600 mL PEEP:  [5 cmH20] 5 cmH20 Pressure Support:  [10 cmH20] 10 cmH20 Plateau Pressure:  [10 cmH20-22 cmH20] 10 cmH20  INTAKE / OUTPUT: I/O last 3 completed shifts: In: 895.3 [NG/GT:295.3; IV Piggyback:600] Out: 1250 [Urine:1250]  PHYSICAL EXAMINATION: General:  Agitated in chair on vent HENT: NCAT ETT in place PULM: CTA B with vent supported breaths CV: RRR, no mgr GI: BS+, soft, nontender MSK: normal bulk/tone Derm: no rash or skin breakdown Neuro: confused, not following commands  LABS:  BMET  Recent Labs Lab 01/24/16 1633 01/25/16 0603  NA 143 143  K 5.2* 5.4*  CL 116* 116*  CO2 20* 17*  BUN 61* 68*  CREATININE 4.08* 3.89*  GLUCOSE 126* 292*    Electrolytes  Recent Labs Lab 01/24/16 1633 01/25/16 0603  CALCIUM 8.1* 8.5*  MG 1.9 1.9  PHOS 5.5* 5.4*    CBC  Recent Labs Lab 01/24/16 1633 01/25/16 0603  WBC 11.1* 10.8*  HGB 9.5* 9.6*  HCT 31.7* 31.8*  PLT 149* 150    Coag's No results for input(s): APTT, INR in the last 168 hours.  Sepsis  Markers No results for input(s): LATICACIDVEN, PROCALCITON, O2SATVEN in the last 168 hours.  ABG  Recent Labs Lab 01/25/16 0355  PHART 7.228*  PCO2ART 40.0  PO2ART 75.8*    Liver Enzymes  Recent Labs Lab 01/24/16 1633  AST 12*  ALT 11*  ALKPHOS 46  BILITOT 0.6  ALBUMIN 2.5*    Cardiac Enzymes No results for input(s): TROPONINI, PROBNP in the last 168 hours.  Glucose  Recent Labs Lab 01/24/16 1607 01/24/16 1946 01/25/16 0032 01/25/16 0418  GLUCAP 129* 128* 196* 309*    Imaging Dg Chest Port 1 View  01/25/2016  CLINICAL DATA:  Fever. EXAM: PORTABLE CHEST 1 VIEW COMPARISON:  January 24, 2016 FINDINGS: Stable support apparatus. No pneumothorax. The focal opacity in the medial right lung base has resolved. Minimal opacity in the bases may simply represent atelectasis. No other changes. IMPRESSION: Resolution of the focal opacity in the medial right lung base. Minimal residual opacity in the bases, likely atelectasis. Electronically Signed   By: Gerome Samavid  Williams III M.D   On: 01/25/2016 08:31   Dg Chest Port 1 View  01/24/2016  CLINICAL DATA:  Respiratory failure. EXAM: PORTABLE CHEST 1 VIEW COMPARISON:  None. FINDINGS: Endotracheal tube in satisfactory position. Nasogastric tube extending into the stomach. Mildly enlarged cardiac silhouette. Patchy opacity  in the right mid to lower lung zone, medially. There is also airspace opacity in the left lower lobe. Possible small left pleural effusion. Diffuse osteopenia. IMPRESSION: 1. Probable pneumonia in the right mid to lower lung zone, medially. A mass cannot be excluded and this will require follow-up. 2. Left lower lobe atelectasis or pneumonia. 3. Possible small left pleural effusion. 4. Mild cardiomegaly. Electronically Signed   By: Beckie Salts M.D.   On: 01/24/2016 17:08     STUDIES:    CULTURES: BC x 2 6/10>>>  Urine 6/10>>>  ANTIBIOTICS: Vanc 6/10>>> 6/11 Imipenem 6/10>>>  SIGNIFICANT EVENTS: 6/8>>>Surgical  repair L hip fx  6/10>> tx [facility]   LINES/TUBES: ETT 6/8>>>   DISCUSSION: 76yo male with hx dementia admitted to [facility] after a fall at home with L hip fx.  Unable to wean from vent post op.  Course c/b acute renal failure, volume overload.  Possible HCAP.  ASSESSMENT / PLAN:  PULMONARY Acute respiratory failure  Pulmonary edema  HCAP? P:   Vent support - 8cc/kg  Daily WUA/SBT Attempt diuresis as below  BD's   CARDIOVASCULAR Acute on chronic CHF  Hypotension > improved metabolic acidosis P:  F/U Echo Diuresis per renal Continue metoprolol BNP    RENAL AKI > UOP improving, Cr stable Hyperkalemia - mild  P:   Monitor BMET and UOP Replace electrolytes as needed Appreciate renal, will defer diuretics to them  GASTROINTESTINAL No active issue  P:   PPI  TF   HEMATOLOGIC No active issue - labs pending  P:  Cbc  SQ heparin    INFECTIOUS ?HCAP  PCN allergic  P:   Stop vanc today F/u culture  BC, urine culture   ENDOCRINE Hyperglycemia    P:   Monitor glucose on chem  SSI   NEUROLOGIC Sedation needs on vent  Hx dementia  P:   RASS goal: -1 PRN fentanyl    FAMILY  - Updates: no family available 6/11  - Inter-disciplinary family meet or Palliative Care meeting due by:  6/17  My cc time 35 minutes  Heber Harvey, MD [place] [facility] Pager: [phone] Cell: [phone] After 3pm or if no response, call [phone]

## 2016-01-25 NOTE — Progress Notes (Signed)
  Echocardiogram 2D Echocardiogram has been performed.  Marisue Humblelexis N Arraya Buck 01/25/2016, 12:23 PM

## 2016-01-25 NOTE — Progress Notes (Signed)
eLink Physician-Brief Progress Note Patient Name: Jared SalvageRonald [name] DOB: [birth year] MRN: [id number]   Date of Service  01/25/2016  HPI/Events of Note  Metabolic acidosis on ABG with pH 7.22/40/75/15.  Also with hyperglycemia.  eICU Interventions  Plan: Increase RR on vent to 24 Start SSI - sensitive scale q4 hours     Intervention Category Major Interventions: Acid-Base disturbance - evaluation and management Intermediate Interventions: Hyperglycemia - evaluation and treatment  Zilphia Kozinski 01/25/2016, 4:37 AM

## 2016-01-26 ENCOUNTER — Inpatient Hospital Stay (HOSPITAL_COMMUNITY): Payer: Medicare Other

## 2016-01-26 DIAGNOSIS — J9621 Acute and chronic respiratory failure with hypoxia: Secondary | ICD-10-CM | POA: Diagnosis not present

## 2016-01-26 LAB — URINE CULTURE: CULTURE: NO GROWTH

## 2016-01-26 LAB — BASIC METABOLIC PANEL
Anion gap: 8 (ref 5–15)
BUN: 62 mg/dL — AB (ref 6–20)
CHLORIDE: 122 mmol/L — AB (ref 101–111)
CO2: 21 mmol/L — ABNORMAL LOW (ref 22–32)
Calcium: 8.8 mg/dL — ABNORMAL LOW (ref 8.9–10.3)
Creatinine, Ser: 2.57 mg/dL — ABNORMAL HIGH (ref 0.61–1.24)
GFR calc Af Amer: 27 mL/min — ABNORMAL LOW (ref >60)
GFR, EST NON AFRICAN AMERICAN: 23 mL/min — AB (ref >60)
GLUCOSE: 273 mg/dL — AB (ref 65–99)
POTASSIUM: 3.8 mmol/L (ref 3.5–5.1)
Sodium: 151 mmol/L — ABNORMAL HIGH (ref 135–145)

## 2016-01-26 LAB — PHOSPHORUS: PHOSPHORUS: 3 mg/dL (ref 2.5–4.6)

## 2016-01-26 LAB — GLUCOSE, CAPILLARY
GLUCOSE-CAPILLARY: 257 mg/dL — AB (ref 65–99)
GLUCOSE-CAPILLARY: 460 mg/dL — AB (ref 65–99)
Glucose-Capillary: 248 mg/dL — ABNORMAL HIGH (ref 65–99)
Glucose-Capillary: 250 mg/dL — ABNORMAL HIGH (ref 65–99)
Glucose-Capillary: 266 mg/dL — ABNORMAL HIGH (ref 65–99)

## 2016-01-26 LAB — MAGNESIUM: Magnesium: 1.9 mg/dL (ref 1.7–2.4)

## 2016-01-26 MED ORDER — SODIUM CHLORIDE 0.9 % IV SOLN
250.0000 mg | Freq: Four times a day (QID) | INTRAVENOUS | Status: DC
  Administered 2016-01-26 – 2016-01-27 (×5): 250 mg via INTRAVENOUS
  Filled 2016-01-26 (×6): qty 250

## 2016-01-26 MED ORDER — CETYLPYRIDINIUM CHLORIDE 0.05 % MT LIQD: 7.0000 mL | Freq: Two times a day (BID) | OROMUCOSAL | Status: DC

## 2016-01-26 MED ORDER — CETYLPYRIDINIUM CHLORIDE 0.05 % MT LIQD
7.0000 mL | Freq: Two times a day (BID) | OROMUCOSAL | Status: DC
  Administered 2016-01-26: 7 mL via OROMUCOSAL

## 2016-01-26 MED FILL — Midazolam HCl Inj 5 MG/5ML (Base Equivalent): INTRAMUSCULAR | Qty: 10 | Status: AC

## 2016-01-26 NOTE — Progress Notes (Signed)
Pharmacy Antibiotic Note  Jared SalvageRonald [name] is a 75 y.o. male admitted on 01/24/2016 with ?HCAP.  Pharmacy has been consulted for imipenem dosing.  WBC 10.8 on 6/11, afebrile. Cr improving to 2.57 (CrCl ~30 ml/min).  Plan: Increase to imipenem 250 mg q6h  F/u cx, clinical status  Height: 6' (182.9 cm) Weight: 219 lb 12.8 oz (99.7 kg) IBW/kg (Calculated) : 77.6  Temp (24hrs), Avg:99.5 F (37.5 C), Min:97.7 F (36.5 C), Max:101.8 F (38.8 C)   Recent Labs Lab 01/24/16 1633 01/25/16 0603 01/26/16 0549  WBC 11.1* 10.8*  --   CREATININE 4.08* 3.89* 2.57*    Estimated Creatinine Clearance: 31.8 mL/min (by C-G formula based on Cr of 2.57).    Allergies  Allergen Reactions  . Penicillins Hives    Early childhood reaction - no other information available    Antimicrobials this admission: Imipenem 6/10 >>  Vancomycin 6/10 >> x1  Dose adjustments this admission: Imipenem 250 mg q12h increased to q6h on 6/12  Microbiology results: 6/10 BCx: pending 6/11 UCx: sent  6/10 MRSA PCR: neg  Thank you for allowing pharmacy to be a part of this patient's care.  Jared [name] 01/26/2016 11:40 AM

## 2016-01-26 NOTE — Progress Notes (Signed)
CBG 460 reported to Dr Delton CoombesByrum and 9 units given per orders tube feeding off

## 2016-01-26 NOTE — Progress Notes (Signed)
Initial Nutrition Assessment  DOCUMENTATION CODES:   Not applicable  INTERVENTION:    If unable to extubate, continue TF via OGT, change to Vital AF 1.2 at goal rate of 80 ml/h (1920 ml per day) to provide 2304 kcals, 144 gm protein, 1557 ml free water daily.  If able to extubate, will monitor for diet advancement and adequacy of oral intake.   NUTRITION DIAGNOSIS:   Inadequate oral intake related to inability to eat as evidenced by NPO status.  GOAL:   Patient will meet greater than or equal to 90% of their needs  MONITOR:   Vent status, Labs, Weight trends, TF tolerance, I & O's  REASON FOR ASSESSMENT:   Consult Enteral/tube feeding initiation and management  ASSESSMENT:   75yo male with hx dementia, DM, previous CVA with residual L sided weakness [facility] 6[facility] after a fall at home with L femoral neck fx which was surgically repaired. Post op patient was unable to wean from vent. Pt had progressive renal failure, volume overload and was tx to [facility] 6/10 for ongoing care and renal eval.   Labs reviewed: sodium elevated. Adult TF protocol was initiated on 6/10. Patient is currently receiving Vital High Protein via OGT at 40 ml/h (960 ml/day) with Prostat 30 ml BID to provide 1160 kcals, 114 gm protein, 803 ml free water daily.  Discussed patient in ICU rounds and with RN today. Plans for extubation today. TF to be held for extubation. Patient is currently intubated on ventilator support MV: 12.1 L/min Temp (24hrs), Avg:99.5 F (37.5 C), Min:97.7 F (36.5 C), Max:101.8 F (38.8 C)   Diet Order:  Diet NPO time specified  Skin:  Reviewed, no issues  Last BM:  unknown  Height:   Ht Readings from Last 1 Encounters:  01/24/16 6' (1.829 m)    Weight:   Wt Readings from Last 1 Encounters:  01/26/16 219 lb 12.8 oz (99.7 kg)    Ideal Body Weight:  80.9 kg  BMI:  Body mass index is 29.8 kg/(m^2).  Estimated Nutritional Needs:    Kcal:  2361  Protein:  120-140 gm  Fluid:  2.3 L  If successfully extubated today, estimated needs would be: 2000-2200 kcals, 110-130 gm protein, 2-2.2 L fluid daily.  EDUCATION NEEDS:   No education needs identified at this time  Joaquin CourtsKimberly Harris, RD, LDN, CNSC Pager [phone] After Hours Pager [phone]

## 2016-01-26 NOTE — Progress Notes (Signed)
   01/26/16 1200  Clinical Encounter Type  Visited With Patient;Health care provider  Visit Type Initial;Psychological support;Spiritual support;Social support  Referral From Care management  Consult/Referral To Chaplain  Spiritual Encounters  Spiritual Needs Emotional  Stress Factors  Patient Stress Factors None identified   Chaplain stopped in to check on Pt. / Pt. was still on the vent. Chaplain was told that Pt will come off the vent today; Chaplain will check in a little later.   Thanks,  Chaplain [employer]rice Trease [place]

## 2016-01-26 NOTE — Procedures (Signed)
Extubation Procedure Note  Patient Details:   Name: Jared [name] DOB: [birth year] MRN: [id number]   Airway Documentation:     Evaluation  O2 sats: stable throughout Complications: No apparent complications Patient did tolerate procedure well. Bilateral Breath Sounds: Clear   Yes   Patient extubated to 4lnc. Vital signs stable at this time. No complications. RN at bedside. RT will continue to monitor.  Ave Filterdkins, Abdurahman Rugg Williams 01/26/2016, 12:31 PM

## 2016-01-26 NOTE — Progress Notes (Signed)
PULMONARY / CRITICAL CARE MEDICINE   Name: Jared [name] MRN: [id number] DOB: 02-22-1941    ADMISSION DATE:  01/24/2016  [facility]  [facility]   CHIEF COMPLAINT:  Acute respiratory failure, AKI   BRIEF:  75yo male with hx dementia, DM, previous CVA with residual L sided weakness [facility] after a fall at home with L femoral neck fx which was surgically repaired.  Post op patient was unable to wean from vent.  Pt had progressive renal failure, volume overload and was tx to [facility] 6/10 for ongoing care and renal eval.    SUBJECTIVE:  Up to chair on MV Tolerating PSV Followed commands Mild agitation   VITAL SIGNS: BP 155/81 mmHg  Pulse 99  Temp(Src) 99.6 F (37.6 C) (Oral)  Resp 21  Ht 6' (1.829 m)  Wt 99.7 kg (219 lb 12.8 oz)  BMI 29.80 kg/m2  SpO2 97%  HEMODYNAMICS:    VENTILATOR SETTINGS: Vent Mode:  [-] PSV;CPAP FiO2 (%):  [40 %] 40 % Set Rate:  [24 bmp] 24 bmp Vt Set:  [600 mL] 600 mL PEEP:  [5 cmH20] 5 cmH20 Pressure Support:  [5 cmH20-10 cmH20] 5 cmH20 Plateau Pressure:  [16 cmH20-17 cmH20] 16 cmH20  INTAKE / OUTPUT: I/O last 3 completed shifts: In: 1695.3 [Other:200; NG/GT:1295.3; IV Piggyback:200] Out: 3260 [Urine:3260]  PHYSICAL EXAMINATION: General:  in chair on vent HENT: NCAT ETT in place PULM: CTA B with vent supported breaths CV: RRR, no mgr GI: BS+, soft, nontender MSK: normal bulk/tone Derm: no rash or skin breakdown Neuro: confused, not following commands  LABS:  BMET  Recent Labs Lab 01/24/16 1633 01/25/16 0603 01/26/16 0549  NA 143 143 151*  K 5.2* 5.4* 3.8  CL 116* 116* 122*  CO2 20* 17* 21*  BUN 61* 68* 62*  CREATININE 4.08* 3.89* 2.57*  GLUCOSE 126* 292* 273*    Electrolytes  Recent Labs Lab 01/24/16 1633 01/25/16 0603 01/25/16 1655 01/26/16 0549  CALCIUM 8.1* 8.5*  --  8.8*  MG 1.9 1.9 1.9 1.9  PHOS 5.5* 5.4* 3.7 3.0    CBC  Recent Labs Lab 01/24/16 1633  01/25/16 0603  WBC 11.1* 10.8*  HGB 9.5* 9.6*  HCT 31.7* 31.8*  PLT 149* 150    Coag's No results for input(s): APTT, INR in the last 168 hours.  Sepsis Markers No results for input(s): LATICACIDVEN, PROCALCITON, O2SATVEN in the last 168 hours.  ABG  Recent Labs Lab 01/25/16 0355  PHART 7.228*  PCO2ART 40.0  PO2ART 75.8*    Liver Enzymes  Recent Labs Lab 01/24/16 1633  AST 12*  ALT 11*  ALKPHOS 46  BILITOT 0.6  ALBUMIN 2.5*    Cardiac Enzymes No results for input(s): TROPONINI, PROBNP in the last 168 hours.  Glucose  Recent Labs Lab 01/25/16 1151 01/25/16 1615 01/25/16 2018 01/25/16 2332 01/26/16 0407 01/26/16 0759  GLUCAP 299* 327* 309* 293* 257* 248*    Imaging Dg Chest Port 1 View  01/26/2016  CLINICAL DATA:  Respiratory failure. EXAM: PORTABLE CHEST 1 VIEW COMPARISON:  01/25/2016. FINDINGS: Endotracheal and NG tube in stable position. Cardiomegaly with pulmonary vascular congestion and interstitial prominence along the left side pleural effusion no suggesting congestive heart failure. Persistent infiltrate next right mid lung again noted. Continued follow-up exams again suggested to demonstrate clearing. No pneumothorax. IMPRESSION: 1. Lines and tubes in stable position. 2. Persistent infiltrate right mid lung again noted. Continued follow-up chest x-rays to demonstrate clearing suggested. 3. Cardiomegaly  with new onset pulmonary venous congestion and bilateral interstitial prominence along with left-sided pleural effusion. Findings consistent with congestive heart failure. Electronically Signed   By: Maisie Fus  Register   On: 01/26/2016 06:52    STUDIES:  TTE 6/11 >> preserved LV fxn, apical ballooning/ aneurysm, EF 55%, mildly dilated LA  CULTURES: BC x 2 6/10>>>  Urine 6/10>>>  ANTIBIOTICS: Vanc 6/10>>> 6/11 Imipenem 6/10>>>  SIGNIFICANT EVENTS: 6/8>>>Surgical repair L hip fx  6/10>> tx [facility]   LINES/TUBES: ETT 6/8>>>   DISCUSSION: 75yo  male with hx dementia admitted to [facility] after a fall at home with L hip fx.  Unable to wean from vent post op.  Course c/b acute renal failure, volume overload.  Possible HCAP.  ASSESSMENT / PLAN:  PULMONARY Acute respiratory failure  Pulmonary edema  HCAP? P:   Vent support - 8cc/kg  Daily WUA/SBT > goal extubate 6/12 Hold diuresis as below 6/12 BD's   CARDIOVASCULAR Acute on chronic CHF  Hypotension > improved metabolic acidosis P:  Hold diuresis on 6/12 Continue metoprolol  RENAL AKI > UOP improving, Cr stable Hyperkalemia - mild  P:   Monitor BMET and UOP Replace electrolytes as needed Hold lasix 6/12 May need to add free water if hyperNa does not improve off lasix.   GASTROINTESTINAL No active issue  P:   PPI  TF > assess by SLP for diet when extubated  HEMATOLOGIC No active issue - labs pending  P:  Cbc  SQ heparin    INFECTIOUS ?HCAP  PCN allergic  P:   Continue imipenem, plan 7 days total abx F/u cultures  ENDOCRINE Hyperglycemia    P:   Monitor glucose on chem  SSI   NEUROLOGIC Sedation needs on vent  Hx dementia  P:   RASS goal: -1 PRN fentanyl    FAMILY  - Updates: no family available 6/11 or 6/12  - Inter-disciplinary family meet or Palliative Care meeting due by:  6/17  My cc time 35 minutes  Levy Pupa, MD, PhD 01/26/2016, 11:33 AM [place] [facility] [phone] or if no answer [phone]

## 2016-01-26 NOTE — Progress Notes (Signed)
Inpatient Diabetes Program Recommendations  AACE/ADA: New Consensus Statement on Inpatient Glycemic Control (2015)  Target Ranges:  Prepandial:   less than 140 mg/dL      Peak postprandial:   less than 180 mg/dL (1-2 hours)      Critically ill patients:  140 - 180 mg/dL   Results for Jared [name], Jared [name] (MRN [id number]) as of 01/26/2016 10:06  Ref. Range 01/25/2016 07:53 01/25/2016 11:51 01/25/2016 16:15 01/25/2016 20:18 01/25/2016 23:32 01/26/2016 04:07 01/26/2016 07:59  Glucose-Capillary Latest Ref Range: 65-99 mg/dL 409273 (H) 811299 (H) 914327 (H) 309 (H) 293 (H) 257 (H) 248 (H)   Review of Glycemic Control  Diabetes history: DM 2 Outpatient Diabetes medications: Amaryl 4mg  Daily, Lantus 20 units Daily, Metformin 500 mg TID Current orders for Inpatient glycemic control: Novolog Sensitive Q4 hrs  Inpatient Diabetes Program Recommendations: Insulin - IV drip/GlucoStabilizer: Glucose in the 200-300 range. Patient is NPO on the Vent and Tube Feeds. Please d/c the current glycemic control orders and place patient on the ICU Glycemi Control order set Phase 2 IV insulin. RN's can transtition between SQ and IV insulin when appropriate.  Thanks,  Christena DeemShannon Arcadia Gorgas RN, MSN, [facility] Inpatient Diabetes Coordinator Team Pager [phone] (8a-5p)

## 2016-01-27 DIAGNOSIS — J189 Pneumonia, unspecified organism: Secondary | ICD-10-CM

## 2016-01-27 LAB — CBC WITH DIFFERENTIAL/PLATELET
Basophils Absolute: 0 10*3/uL (ref 0.0–0.1)
Basophils Relative: 0 %
EOS ABS: 0 10*3/uL (ref 0.0–0.7)
EOS PCT: 0 %
HCT: 30.9 % — ABNORMAL LOW (ref 39.0–52.0)
HEMOGLOBIN: 9.5 g/dL — AB (ref 13.0–17.0)
LYMPHS ABS: 0.6 10*3/uL — AB (ref 0.7–4.0)
LYMPHS PCT: 6 %
MCH: 29.3 pg (ref 26.0–34.0)
MCHC: 30.7 g/dL (ref 30.0–36.0)
MCV: 95.4 fL (ref 78.0–100.0)
MONOS PCT: 8 %
Monocytes Absolute: 0.8 10*3/uL (ref 0.1–1.0)
NEUTROS PCT: 86 %
Neutro Abs: 8.8 10*3/uL — ABNORMAL HIGH (ref 1.7–7.7)
Platelets: 166 10*3/uL (ref 150–400)
RBC: 3.24 MIL/uL — AB (ref 4.22–5.81)
RDW: 16.4 % — ABNORMAL HIGH (ref 11.5–15.5)
WBC: 10.3 10*3/uL (ref 4.0–10.5)

## 2016-01-27 LAB — BASIC METABOLIC PANEL
ANION GAP: 6 (ref 5–15)
BUN: 47 mg/dL — ABNORMAL HIGH (ref 6–20)
CHLORIDE: 129 mmol/L — AB (ref 101–111)
CO2: 21 mmol/L — AB (ref 22–32)
CREATININE: 1.98 mg/dL — AB (ref 0.61–1.24)
Calcium: 8.9 mg/dL (ref 8.9–10.3)
GFR calc non Af Amer: 32 mL/min — ABNORMAL LOW (ref >60)
GFR, EST AFRICAN AMERICAN: 37 mL/min — AB (ref >60)
Glucose, Bld: 250 mg/dL — ABNORMAL HIGH (ref 65–99)
Potassium: 3.9 mmol/L (ref 3.5–5.1)
SODIUM: 156 mmol/L — AB (ref 135–145)

## 2016-01-27 LAB — MAGNESIUM: MAGNESIUM: 1.8 mg/dL (ref 1.7–2.4)

## 2016-01-27 LAB — GLUCOSE, CAPILLARY
GLUCOSE-CAPILLARY: 223 mg/dL — AB (ref 65–99)
GLUCOSE-CAPILLARY: 197 mg/dL — AB (ref 65–99)
GLUCOSE-CAPILLARY: 246 mg/dL — AB (ref 65–99)
Glucose-Capillary: 296 mg/dL — ABNORMAL HIGH (ref 65–99)
Glucose-Capillary: 308 mg/dL — ABNORMAL HIGH (ref 65–99)

## 2016-01-27 MED ORDER — INSULIN GLARGINE 100 UNIT/ML ~~LOC~~ SOLN
10.0000 [IU] | Freq: Every day | SUBCUTANEOUS | Status: DC
  Administered 2016-01-27 – 2016-01-28 (×2): 10 [IU] via SUBCUTANEOUS
  Filled 2016-01-27 (×2): qty 0.1

## 2016-01-27 MED ORDER — SODIUM CHLORIDE 0.9 % IV SOLN
500.0000 mg | Freq: Three times a day (TID) | INTRAVENOUS | Status: DC
  Administered 2016-01-27 – 2016-01-30 (×9): 500 mg via INTRAVENOUS
  Filled 2016-01-27 (×11): qty 500

## 2016-01-27 MED ORDER — IPRATROPIUM-ALBUTEROL 0.5-2.5 (3) MG/3ML IN SOLN
3.0000 mL | Freq: Three times a day (TID) | RESPIRATORY_TRACT | Status: DC
  Administered 2016-01-27 – 2016-02-04 (×24): 3 mL via RESPIRATORY_TRACT
  Filled 2016-01-27 (×23): qty 3

## 2016-01-27 MED ORDER — ENALAPRIL MALEATE 5 MG PO TABS
20.0000 mg | ORAL_TABLET | Freq: Every day | ORAL | Status: DC
  Administered 2016-01-27 – 2016-01-28 (×2): 20 mg via ORAL
  Filled 2016-01-27: qty 1
  Filled 2016-01-27: qty 4

## 2016-01-27 MED ORDER — CETYLPYRIDINIUM CHLORIDE 0.05 % MT LIQD
7.0000 mL | Freq: Two times a day (BID) | OROMUCOSAL | Status: DC
Start: 2016-01-27 — End: 2016-02-11
  Administered 2016-01-27 – 2016-02-11 (×29): 7 mL via OROMUCOSAL

## 2016-01-27 MED ORDER — IPRATROPIUM-ALBUTEROL 0.5-2.5 (3) MG/3ML IN SOLN
3.0000 mL | RESPIRATORY_TRACT | Status: DC | PRN
  Filled 2016-01-27: qty 3

## 2016-01-27 NOTE — Progress Notes (Signed)
PULMONARY / CRITICAL CARE MEDICINE   Name: Jared [name] MRN: [id number] DOB: August 31, 1940    ADMISSION DATE:  01/24/2016  [facility]   CHIEF COMPLAINT:  Acute respiratory failure, AKI   BRIEF:  75yo male with hx dementia, DM, previous CVA with residual L sided weakness initially admitted to [facility] 6/7 after a fall at home with L femoral neck fx which was surgically repaired.  Post op patient was unable to wean from vent.  Pt had progressive renal failure, volume overload and was tx to [facility] 6/10 for ongoing care and renal eval.    SUBJECTIVE:  Pt alert following commands confused to time and place currently on 3L O2 via nasal canula tolerating well.  Denies shortness of breath, chest pain, or wheezing.   VITAL SIGNS: BP 166/86 mmHg  Pulse 79  Temp(Src) 98.8 F (37.1 C) (Oral)  Resp 31  Ht 6' (1.829 m)  Wt 218 lb 7.6 oz (99.1 kg)  BMI 29.62 kg/m2  SpO2 97%  HEMODYNAMICS:    VENTILATOR SETTINGS:    INTAKE / OUTPUT: I/O last 3 completed shifts: In: 1615 [P.O.:120; Other:355; NG/GT:640; IV Piggyback:500] Out: 4075 [Urine:4075]  PHYSICAL EXAMINATION: General:  Chronically ill appearing male  HENT: NCAT, supple, no JVD  PULM: diminished throughout, even, non labored CV: s1s2, RRR, no mgr GI: BS+ x4, soft, nontender, nondistended MSK: normal bulk/tone Derm: no rash or skin breakdown Neuro: alert and oriented to person, follows commands, pupils 3 mm equal brisk  LABS:  BMET  Recent Labs Lab 01/25/16 0603 01/26/16 0549 01/27/16 0215  NA 143 151* 156*  K 5.4* 3.8 3.9  CL 116* 122* 129*  CO2 17* 21* 21*  BUN 68* 62* 47*  CREATININE 3.89* 2.57* 1.98*  GLUCOSE 292* 273* 250*    Electrolytes  Recent Labs Lab 01/25/16 0603 01/25/16 1655 01/26/16 0549 01/27/16 0215  CALCIUM 8.5*  --  8.8* 8.9  MG 1.9 1.9 1.9 1.8  PHOS 5.4* 3.7 3.0  --     CBC  Recent Labs Lab 01/24/16 1633 01/25/16 0603 01/27/16 0215  WBC 11.1* 10.8*  10.3  HGB 9.5* 9.6* 9.5*  HCT 31.7* 31.8* 30.9*  PLT 149* 150 166    Coag's No results for input(s): APTT, INR in the last 168 hours.  Sepsis Markers No results for input(s): LATICACIDVEN, PROCALCITON, O2SATVEN in the last 168 hours.  ABG  Recent Labs Lab 01/25/16 0355  PHART 7.228*  PCO2ART 40.0  PO2ART 75.8*    Liver Enzymes  Recent Labs Lab 01/24/16 1633  AST 12*  ALT 11*  ALKPHOS 46  BILITOT 0.6  ALBUMIN 2.5*    Cardiac Enzymes No results for input(s): TROPONINI, PROBNP in the last 168 hours.  Glucose  Recent Labs Lab 01/26/16 1151 01/26/16 1534 01/26/16 1955 01/27/16 0003 01/27/16 0416 01/27/16 0759  GLUCAP 460* 250* 266* 296* 223* 197*    Imaging No results found.  STUDIES:  TTE 6/11 >> preserved LV fxn, apical ballooning/ aneurysm, EF 55%, mildly dilated LA  CULTURES: BC x 2 6/10>>negative>>  Urine 6/10>>negative 6/12  ANTIBIOTICS: Vanc 6/10>>> 6/11 Imipenem 6/10>>  SIGNIFICANT EVENTS: 6/8>>>Surgical repair L hip fx  6/10>> tx [facility]   LINES/TUBES: ETT 6/8>>6/12  DISCUSSION: 75yo male with hx dementia admitted to [facility] after a fall at home with L hip fx.  Unable to wean from vent post op.  Course c/b acute renal failure, volume overload.  Possible HCAP.  ASSESSMENT / PLAN:  PULMONARY Acute respiratory failure  Pulmonary edema  HCAP? P:   Supplemental O2 to maintain O2 sats 92% or above or for dyspnea Pulmonary Hygiene Continue Duonebs Encourage early ambulation Incentive spirometry  CARDIOVASCULAR Acute on chronic CHF  Hypotension-respolved Hypertension Metabolic acidosis P:  Continue metoprolol Restart outpatient enalapril   RENAL AKI > UOP improving, Cr stable Hyperkalemia - mild  Hypernatremia  P:   Monitor BMET and UOP Replace electrolytes as needed Start free water for hyperNa  GASTROINTESTINAL No active issue  P:   PPI  Advance diet as tolerated Speech therapy following  HEMATOLOGIC No active  issue - labs pending  P:  Monitor CBC SQ heparin  Monitor for s/sx of bleeding  INFECTIOUS ?HCAP  PCN allergic  P:   Continue imipenem, plan 7 days total abx F/u cultures  ENDOCRINE Hyperglycemia    P:   CBG q4hrs SSI  Start Lantus today   NEUROLOGIC Hx dementia  P: Frequent reorientation Avoid sedating medications Lights on during the day  PT consult   FAMILY  - Updates: no family available 6/12 or 6/13  - Inter-disciplinary family meet or Palliative Care meeting due by:  6/17 -Plans to transfer pt to any medsurg with transfer service to [facility]   Sonda Rumbleana Blakeney, AGNP  Pulmonary/Critical Care   Attending Note:  I have examined patient, reviewed labs, studies and notes. I have discussed the case with D Blakeney, and I agree with the data and plans as amended above. 75 yo man with complicated hosp course following hip fracture and repair. Developed persistent resp failure due to suspected HCAP + pulmonary edema. Extubated 6/12. On eval today he is breathing comfortably, has some confusion but is redirectable. Distant breath sounds. We will plan to restart his enalapril given improvement in his renal fxn, hold off on any further diuresis. Continue empiric abx for suspected HCAP. Will transfer to floor bed and ask TRH to assume his care.   Levy Pupaobert Byrum, MD, PhD 01/27/2016, 11:59 AM [place] [facility] [phone] or if no answer [phone]

## 2016-01-27 NOTE — Care Management Important Message (Signed)
Important Message  Patient Details  Name: Jared [name] MRN: [id number] Date of Birth: [birth year]   Medicare Important Message Given:  Yes    Bernadette HoitShoffner, [name] 01/27/2016, 8:36 AM

## 2016-01-27 NOTE — Progress Notes (Signed)
01/27/2016 7:10 PM  Pt transferred to unit from 71M at 1855.  Pt placed on telemetry box 27, confirmed with CCMD and with Clement Sayresathie Wicker, RN.  Skin assessment reveals skin tear to left forearm measuring 1.5*1*0.1, no drainage, with foam intact.  Incision noted to left hip, measuring approximately 4*0.2*0.2, no drainage, dressing intact.  Left heel red but blancheable.  Bed alarm turned on, bed in lowest position, call bell within reach.  Report given to oncoming nurse, Cathie.    Theadora RamaKIRKMAN, Jaysten Essner Brooke

## 2016-01-27 NOTE — Progress Notes (Signed)
Pharmacy Antibiotic Note  Jared [name] is a 75 y.o. male admitted on 01/24/2016 with ?HCAP.  Pharmacy has been consulted for imipenem dosing.  WBC now wnl, afebrile. Cr continues to improve to 2 (CrCl ~40 ml/min).  Plan: Increase to imipenem 500 mg q8h  F/u cx, clinical status  Height: 6' (182.9 cm) Weight: 218 lb 7.6 oz (99.1 kg) IBW/kg (Calculated) : 77.6  Temp (24hrs), Avg:99.8 F (37.7 C), Min:98.8 F (37.1 C), Max:100.9 F (38.3 C)   Recent Labs Lab 01/24/16 1633 01/25/16 0603 01/26/16 0549 01/27/16 0215  WBC 11.1* 10.8*  --  10.3  CREATININE 4.08* 3.89* 2.57* 1.98*    Estimated Creatinine Clearance: 41.1 mL/min (by C-G formula based on Cr of 1.98).    Allergies  Allergen Reactions  . Penicillins Hives    Early childhood reaction - no other information available    Antimicrobials this admission: Imipenem 6/10 >>  Vancomycin 6/10 >> x1  Dose adjustments this admission: Imipenem 250 mg q12h increased to q6h on 6/12  Microbiology results: 6/10 BCx: pending 6/11 UCx: sent  6/10 MRSA PCR: neg  Thank you for allowing pharmacy to be a part of this patient's care.  Jared [name] 01/27/2016 12:35 PM

## 2016-01-27 NOTE — Progress Notes (Signed)
eLink Physician-Brief Progress Note Patient Name: Jared SalvageRonald [name] DOB: 1941-05-28 MRN: [id number]   Date of Service  01/27/2016  HPI/Events of Note  No AM labs ordered.   eICU Interventions  Will order CBC, BMP and Mg++ in AM.     Intervention Category Minor Interventions: Clinical assessment - ordering diagnostic tests  Sommer,Steven Eugene 01/27/2016, 1:50 AM

## 2016-01-27 NOTE — Evaluation (Signed)
Clinical/Bedside Swallow Evaluation Patient Details  Name: Jared [name] MRN: [id number] Date of Birth: March 23, 1941  Today's Date: 01/27/2016 Time: SLP Start Time (ACUTE ONLY): 0920 SLP Stop Time (ACUTE ONLY): 0933 SLP Time Calculation (min) (ACUTE ONLY): 13 min  Past Medical History:  Past Medical History  Diagnosis Date  . High blood pressure   . GERD (gastroesophageal reflux disease)   . Bruises easily   . Anxiety   . Hearing loss   . Diabetes mellitus   . Stroke (HCC)   . Heart attack [facility])    Past Surgical History:  Past Surgical History  Procedure Laterality Date  . Gallbladder surgery     HPI:  75yo male with hx dementia, DM, previous CVA with residual L sided weakness initially admitted to [facility] 6/7 after a fall at home with L femoral neck fx which was surgically repaired. Post op patient was unable to wean from vent. Pt had progressive renal failure, volume overload and was tx to [facility] 6/10 for ongoing care and renal eval.    Assessment / Plan / Recommendation Clinical Impression  Pt referred for clinical assessment of swallow function following extubation. Pt currently on full liquid diet. Pt positioned upright and trialed with ice, thin, and puree. Pt demonstrated oral holding with all consistencies. Required verbal cueing to initiate A-P transit with puree. Solids were not assessed given oral deficits and decreased command following. Recommend: Dys 1 with thin. Meds whole with puree. Will follow.     Aspiration Risk  Mild aspiration risk    Diet Recommendation Dysphagia 1 (Puree);Thin liquid   Liquid Administration via: Cup;Straw (small sips) Medication Administration: Whole meds with puree Supervision: Staff to assist with self feeding;Full supervision/cueing for compensatory strategies Compensations: Minimize environmental distractions;Slow rate;Small sips/bites (Verbal cueing to swallow with solids) Postural Changes: Seated upright at 90 degrees     Other  Recommendations Oral Care Recommendations: Oral care QID   Follow up Recommendations  Skilled Nursing facility    Frequency and Duration min 2x/week  1 week       Prognosis Prognosis for Safe Diet Advancement: Fair Barriers to Reach Goals: Cognitive deficits      Swallow Study   General Date of Onset: 01/21/16 HPI: 75yo male with hx dementia, DM, previous CVA with residual L sided weakness initially admitted to [facility] 6/7 after a fall at home with L femoral neck fx which was surgically repaired. Post op patient was unable to wean from vent. Pt had progressive renal failure, volume overload and was tx to [facility] 6/10 for ongoing care and renal eval.  Type of Study: Bedside Swallow Evaluation Previous Swallow Assessment: none known Diet Prior to this Study:  (Full liquids) Temperature Spikes Noted: Yes Respiratory Status: Nasal cannula (4 L) History of Recent Intubation: Yes Length of Intubations (days): 6 days Date extubated: 01/26/16 Behavior/Cognition: Lethargic/Drowsy;Confused;Requires cueing Oral Cavity Assessment: Within Functional Limits Oral Care Completed by SLP: Recent completion by staff Oral Cavity - Dentition: Poor condition;Missing dentition (nearly edentulous- pt reports he has dentures, but none foun) Self-Feeding Abilities: Total assist Patient Positioning: Upright in bed Baseline Vocal Quality: Low vocal intensity Volitional Cough: Cognitively unable to elicit Volitional Swallow: Unable to elicit    Oral/Motor/Sensory Function Overall Oral Motor/Sensory Function: Within functional limits (Pt unable to participate with formal assessment)   Ice Chips Ice chips: Impaired Presentation: Spoon Oral Phase Functional Implications: Oral holding Pharyngeal Phase Impairments: Suspected delayed Swallow   Thin Liquid Thin Liquid: Impaired Presentation: Cup;Spoon;Straw Oral Phase  Functional Implications: Oral holding    Nectar Thick     Honey Thick      Puree Puree: Impaired Presentation: Spoon Oral Phase Functional Implications: Oral holding (required verbal cue to initiate pharyngeal phase)   Solid   GO   Solid: Not tested        Rocky CraftsKara E Delfin Squillace MA, CCC-SLP 01/27/2016,9:46 AM

## 2016-01-27 NOTE — Care Management Note (Signed)
Case Management Note  Patient Details  Name: Jared [name] MRN: [id number] Date of Birth: 10-25-1940  Subjective/Objective:  Pt admitted with acute resp failure                  Action/Plan:  PTA from home with wife - uses walker and wheelchair at home to assist with mobility,  PT/OT ordered.  CM informed of pending SNF consult.  CM will continue to monitor for disposition needs   Expected Discharge Date:                  Expected Discharge Plan:  Skilled Nursing Facility  In-House Referral:     Discharge planning Services  CM Consult  Post Acute Care Choice:    Choice offered to:     DME Arranged:    DME Agency:     HH Arranged:    HH Agency:     Status of Service:  In process, will continue to follow  Medicare Important Message Given:  Yes Date Medicare IM Given:    Medicare IM give by:    Date Additional Medicare IM Given:    Additional Medicare Important Message give by:     If discussed at Long Length of Stay Meetings, dates discussed:    Additional Comments:  Cherylann ParrClaxton, Breannah Kratt S, RN 01/27/2016, 11:14 AM

## 2016-01-28 DIAGNOSIS — E1165 Type 2 diabetes mellitus with hyperglycemia: Secondary | ICD-10-CM

## 2016-01-28 DIAGNOSIS — E870 Hyperosmolality and hypernatremia: Secondary | ICD-10-CM | POA: Diagnosis not present

## 2016-01-28 DIAGNOSIS — Z794 Long term (current) use of insulin: Secondary | ICD-10-CM

## 2016-01-28 DIAGNOSIS — F0150 Vascular dementia without behavioral disturbance: Secondary | ICD-10-CM | POA: Diagnosis present

## 2016-01-28 DIAGNOSIS — J189 Pneumonia, unspecified organism: Secondary | ICD-10-CM | POA: Diagnosis present

## 2016-01-28 DIAGNOSIS — J9601 Acute respiratory failure with hypoxia: Secondary | ICD-10-CM | POA: Diagnosis present

## 2016-01-28 DIAGNOSIS — S72002D Fracture of unspecified part of neck of left femur, subsequent encounter for closed fracture with routine healing: Secondary | ICD-10-CM

## 2016-01-28 DIAGNOSIS — N179 Acute kidney failure, unspecified: Secondary | ICD-10-CM | POA: Diagnosis present

## 2016-01-28 LAB — BASIC METABOLIC PANEL
ANION GAP: 5 (ref 5–15)
BUN: 42 mg/dL — AB (ref 6–20)
BUN: 40 mg/dL — ABNORMAL HIGH (ref 6–20)
CALCIUM: 8.8 mg/dL — AB (ref 8.9–10.3)
CALCIUM: 8.7 mg/dL — AB (ref 8.9–10.3)
CHLORIDE: 129 mmol/L — AB (ref 101–111)
CO2: 23 mmol/L (ref 22–32)
CO2: 24 mmol/L (ref 22–32)
CREATININE: 1.8 mg/dL — AB (ref 0.61–1.24)
Chloride: >130 mmol/L (ref 101–111)
Creatinine, Ser: 1.82 mg/dL — ABNORMAL HIGH (ref 0.61–1.24)
GFR calc Af Amer: 42 mL/min — ABNORMAL LOW (ref >60)
GFR calc non Af Amer: 35 mL/min — ABNORMAL LOW (ref >60)
GFR, EST AFRICAN AMERICAN: 41 mL/min — AB (ref >60)
GFR, EST NON AFRICAN AMERICAN: 36 mL/min — AB (ref >60)
Glucose, Bld: 295 mg/dL — ABNORMAL HIGH (ref 65–99)
Glucose, Bld: 262 mg/dL — ABNORMAL HIGH (ref 65–99)
POTASSIUM: 3.6 mmol/L (ref 3.5–5.1)
Potassium: 3.9 mmol/L (ref 3.5–5.1)
SODIUM: 161 mmol/L — AB (ref 135–145)
Sodium: 158 mmol/L — ABNORMAL HIGH (ref 135–145)

## 2016-01-28 LAB — GLUCOSE, CAPILLARY
GLUCOSE-CAPILLARY: 229 mg/dL — AB (ref 65–99)
Glucose-Capillary: 219 mg/dL — ABNORMAL HIGH (ref 65–99)
Glucose-Capillary: 274 mg/dL — ABNORMAL HIGH (ref 65–99)
Glucose-Capillary: 252 mg/dL — ABNORMAL HIGH (ref 65–99)
Glucose-Capillary: 321 mg/dL — ABNORMAL HIGH (ref 65–99)
Glucose-Capillary: 238 mg/dL — ABNORMAL HIGH (ref 65–99)
Glucose-Capillary: 376 mg/dL — ABNORMAL HIGH (ref 65–99)

## 2016-01-28 LAB — CBC WITH DIFFERENTIAL/PLATELET
Basophils Absolute: 0 10*3/uL (ref 0.0–0.1)
Basophils Relative: 0 %
EOS ABS: 0.1 10*3/uL (ref 0.0–0.7)
EOS PCT: 1 %
HCT: 32.8 % — ABNORMAL LOW (ref 39.0–52.0)
HEMOGLOBIN: 9.8 g/dL — AB (ref 13.0–17.0)
LYMPHS ABS: 1.1 10*3/uL (ref 0.7–4.0)
Lymphocytes Relative: 11 %
MCH: 29.3 pg (ref 26.0–34.0)
MCHC: 29.9 g/dL — ABNORMAL LOW (ref 30.0–36.0)
MCV: 98.2 fL (ref 78.0–100.0)
MONOS PCT: 7 %
Monocytes Absolute: 0.7 10*3/uL (ref 0.1–1.0)
NEUTROS PCT: 81 %
Neutro Abs: 7.7 10*3/uL (ref 1.7–7.7)
PLATELETS: 205 10*3/uL (ref 150–400)
RBC: 3.34 MIL/uL — ABNORMAL LOW (ref 4.22–5.81)
RDW: 16.7 % — AB (ref 11.5–15.5)
WBC: 9.6 10*3/uL (ref 4.0–10.5)

## 2016-01-28 LAB — SODIUM, URINE, RANDOM: Sodium, Ur: 48 mmol/L

## 2016-01-28 LAB — OSMOLALITY, URINE: OSMOLALITY UR: 488 mosm/kg (ref 300–900)

## 2016-01-28 MED ORDER — METOPROLOL TARTRATE 12.5 MG HALF TABLET
12.5000 mg | ORAL_TABLET | Freq: Two times a day (BID) | ORAL | Status: DC
  Administered 2016-01-28 – 2016-02-01 (×8): 12.5 mg via ORAL
  Filled 2016-01-28 (×8): qty 1

## 2016-01-28 MED ORDER — GLUCERNA SHAKE PO LIQD
237.0000 mL | Freq: Three times a day (TID) | ORAL | Status: DC
  Administered 2016-01-28 – 2016-01-31 (×9): 237 mL via ORAL

## 2016-01-28 MED ORDER — PANTOPRAZOLE SODIUM 40 MG PO TBEC
40.0000 mg | DELAYED_RELEASE_TABLET | Freq: Every day | ORAL | Status: DC
  Administered 2016-01-28 – 2016-02-03 (×7): 40 mg via ORAL
  Filled 2016-01-28 (×7): qty 1

## 2016-01-28 MED ORDER — INSULIN GLARGINE 100 UNIT/ML ~~LOC~~ SOLN
20.0000 [IU] | Freq: Every day | SUBCUTANEOUS | Status: DC
  Filled 2016-01-28: qty 0.2

## 2016-01-28 MED ORDER — INSULIN ASPART 100 UNIT/ML ~~LOC~~ SOLN
0.0000 [IU] | Freq: Three times a day (TID) | SUBCUTANEOUS | Status: DC
  Administered 2016-01-28: 3 [IU] via SUBCUTANEOUS
  Administered 2016-01-29: 7 [IU] via SUBCUTANEOUS

## 2016-01-28 MED ORDER — FREE WATER
300.0000 mL | Freq: Four times a day (QID) | Status: DC
  Administered 2016-01-28 – 2016-02-01 (×11): 300 mL via ORAL

## 2016-01-28 NOTE — Progress Notes (Signed)
Nutrition Follow-up  DOCUMENTATION CODES:   Not applicable  INTERVENTION:  Provide Glucerna Shake po TID, each supplement provides 220 kcal and 10 grams of protein.  Encourage adequate PO intake.   NUTRITION DIAGNOSIS:   Inadequate oral intake related to inability to eat as evidenced by NPO status; diet advanced; po intake 25-50%; ongoing  GOAL:   Patient will meet greater than or equal to 90% of their needs; progressing  MONITOR:   PO intake, Supplement acceptance, Diet advancement, Weight trends, Labs, I & O's  REASON FOR ASSESSMENT:   Consult Enteral/tube feeding initiation and management  ASSESSMENT:   75yo male with hx dementia, DM, previous CVA with residual L sided weakness [facility] after a fall at home with L femoral neck fx which was surgically repaired. Post op patient was unable to wean from vent. Pt had progressive renal failure, volume overload and was tx to [facility] 6/10 for ongoing care and renal eval.  Pt extubated 6/12.    Pt is currently on a dysphagia 1 diet with thin liquids. Per SLP, pt to remain on this diet as pt's dentures are not available. Meal completion has been 25-50%. Appetite reported to be fine. Pt reports eating well PTA with no other difficulties. Pt is agreeable to nutritional supplementation to aid in caloric and protein needs. RD to order. Pt encouraged to eat his food at meals.   CBG's 197-321 mg/dL.  Diet Order:  DIET - DYS 1 Room service appropriate?: Yes; Fluid consistency:: Thin  Skin:  Reviewed, no issues  Last BM:  6/13-loose  Height:   Ht Readings from Last 1 Encounters:  01/24/16 6' (1.829 m)    Weight:   Wt Readings from Last 1 Encounters:  01/27/16 218 lb 1.6 oz (98.93 kg)    Ideal Body Weight:  80.9 kg  BMI:  Body mass index is 29.57 kg/(m^2).  Estimated Nutritional Needs:   Kcal:  2000-2200  Protein:  110-130 grams  Fluid:  2- 2.2 L/day  EDUCATION NEEDS:   No  education needs identified at this time  Jared SmilingStephanie Adarian Bur, MS, RD, LDN Pager # [phone] After hours/ weekend pager # [phone]

## 2016-01-28 NOTE — Evaluation (Signed)
Occupational Therapy Evaluation Patient Details Name: Jared [name] MRN: [id number] DOB: 1941/05/23 Today's Date: 01/28/2016    History of Present Illness 75yo male with hx dementia, DM, previous CVA with residual L sided weakness initially admitted to [facility] 6/7 after a fall at home with L femoral neck fx which was surgically repaired. Post op patient was unable to wean from vent. Pt had progressive renal failure, volume overload and was tx to [facility] 6/10 for ongoing care and renal eval.    Clinical Impression   Patient presenting with decreased ADL and functional mobility independence. Unsure of patient's prior level of function PTA or patient's living arrangements. Patient currently functioning at an overall total assist level for bed level ADLs. Patient will benefit from acute OT to increase overall independence in the areas of ADLs, functional mobility, and overall safety in order to safely discharge to venue listed below.   Limited evaluation: NOTE--type of hip surgery, type hip precautions, and weight-bearing status unknown     Follow Up Recommendations  SNF;Supervision/Assistance - 24 hour    Equipment Recommendations  Other (comment) (TBD)    Recommendations for Other Services  None at this time  Precautions / Restrictions Precautions Precautions: Fall;Other (comment) Precaution Comments: **unknown type hip repair or precautions    Mobility Bed Mobility Overal bed mobility: Needs Assistance Bed Mobility: Rolling Rolling: Total assist General bed mobility comments: even with initiation, pt unable to assist with Lt extremities  Transfers General transfer comment: NT due to unknown hip precautions or weight bearing status    Balance Not tested    ADL Overall ADL's : Needs assistance/impaired General ADL Comments: Pt overall total assist for ADLs at bed level. Unsure of patient's baseline or PLOF as no family present during OT eval.     Vision Additional  Comments: Unsure - to be further tested          Pertinent Vitals/Pain Pain Assessment: No/denies pain Faces Pain Scale: No hurt     Hand Dominance  (unsure)   Extremity/Trunk Assessment Upper Extremity Assessment Upper Extremity Assessment: RUE deficits/detail;LUE deficits/detail RUE Deficits / Details: biceps 3+, triceps 4; AAROM WFL LUE Deficits / Details: AAROM WFL; able to grip on command, could not follow other commands; +flexor tone LUE Sensation:  (appears decreased/inattention)   Lower Extremity Assessment Lower Extremity Assessment: Defer to PT evaluation   Cervical / Trunk Assessment Cervical / Trunk Assessment: Other exceptions Cervical / Trunk Exceptions: maintains head turned to Lt mostly   Communication Communication Communication: HOH   Cognition Arousal/Alertness: Awake/alert Behavior During Therapy: Flat affect Overall Cognitive Status: No family/caregiver present to determine baseline cognitive functioning (h/o dementia, knew name and DOB only)              Home Living Family/patient expects to be discharged to:: Unsure    Prior Functioning/Environment Level of Independence: Needs assistance  Gait / Transfers Assistance Needed: per CM note, pt lives with wife and uses walker vs wheelchair for mobility    OT Diagnosis: Generalized weakness   OT Problem List: Decreased strength;Decreased range of motion;Decreased activity tolerance;Impaired balance (sitting and/or standing);Decreased coordination;Decreased cognition;Decreased safety awareness;Decreased knowledge of use of DME or AE;Decreased knowledge of precautions;Impaired sensation;Obesity;Impaired UE functional use   OT Treatment/Interventions: Self-care/ADL training;Therapeutic exercise;Energy conservation;DME and/or AE instruction;Therapeutic activities;Patient/family education;Balance training    OT Goals(Current goals can be found in the care plan section) Acute Rehab OT Goals Patient Stated  Goal: Pt unable to state OT Goal Formulation: Patient unable to participate in  goal setting Time For Goal Achievement: 02/11/16 Potential to Achieve Goals: Fair ADL Goals Pt Will Perform Grooming: with min assist;sitting (EOB unsupported) Pt Will Transfer to Toilet: with mod assist;bedside commode;stand pivot transfer Additional ADL Goal #1: Pt will engage in bed mobility with min assist as a precursor for ADLs and to decrease burden of care Additional ADL Goal #2: Pt will perform sit to/from stands with mod assist as a precursor for ADLs and to decrease burden of care   OT Frequency: Min 2X/week   Barriers to D/C: Unsure    End of Session Equipment Utilized During Treatment: Oxygen Nurse Communication: Mobility status;Other (comment) (pt on RA upon OT entering room, 02sats=77%)  Activity Tolerance: Patient limited by fatigue;Patient limited by lethargy (decreased oxygyen support) Patient left: in bed;with call bell/phone within reach;with bed alarm set;with nursing/sitter in room   Time: 1424-1436 OT Time Calculation (min): 12 min Charges:  OT General Charges $OT Visit: 1 Procedure OT Evaluation $OT Eval High Complexity: 1 Procedure  Edwin CapPatricia Syrai Gladwin , MS, OTR/L, CLT Pager: [phone]  01/28/2016, 2:50 PM

## 2016-01-28 NOTE — Evaluation (Signed)
Physical Therapy Evaluation Patient Details Name: Jared SalvageRonald [name] MRN: [id number] DOB: 05-12-1941 Today's Date: 01/28/2016   History of Present Illness  74yo male with hx dementia, DM, previous CVA with residual L sided weakness [facility] 6[facility] after a fall at home with L femoral neck fx which was surgically repaired. Post op patient was unable to wean from vent. Pt had progressive renal failure, volume overload and was tx to [facility] 6/10 for ongoing care and renal eval.    NOTE--type of hip surgery, type hip precautions, and weight-bearing status unknown    Clinical Impression  Patient is s/p above surgery resulting in functional limitations due to the deficits listed below (see PT Problem List) Patient will benefit from skilled PT to increase their independence and safety with mobility to allow discharge to the venue listed below. Per chart, pt from home with his wife and used walker vs wheelchair for mobility due to prior CVA. Able to follow commands and feel can benefit from PT, although likely more slowly due to prior deficits.     Follow Up Recommendations SNF;Supervision/Assistance - 24 hour    Equipment Recommendations  None recommended by PT    Recommendations for Other Services OT consult     Precautions / Restrictions Precautions Precautions: Fall;Other (comment) Precaution Comments: **unknown type hip repair or precautions Restrictions Weight Bearing Restrictions: Yes LLE Weight Bearing: Partial weight bearing      Mobility  Bed Mobility Overal bed mobility: Needs Assistance Bed Mobility: Rolling Rolling: Total assist         General bed mobility comments: even with initiation, pt unable to assist with Lt extremities  Transfers                 General transfer comment: NT due to unknown hip precautions or weight bearing status  Ambulation/Gait             General Gait Details: NT due to unknown hip precautions or weight  bearing status  Stairs            Wheelchair Mobility    Modified Rankin (Stroke Patients Only)       Balance                                             Pertinent Vitals/Pain Pain Assessment: Faces Faces Pain Scale: No hurt (no grimacing with LLE movement; ?sensation)    Home Living Family/patient expects to be discharged to:: Unsure                      Prior Function Level of Independence: Needs assistance   Gait / Transfers Assistance Needed: per CM note, pt lives with wife and uses walker vs wheelchair for mobility           Hand Dominance        Extremity/Trunk Assessment   Upper Extremity Assessment: RUE deficits/detail;LUE deficits/detail (R) RUE Deficits / Details: biceps 3+, triceps 4; AAROM WFL     LUE Deficits / Details: AAROM WFL; able to grip on command, could not follow other commands; +flexor tone   Lower Extremity Assessment: RLE deficits/detail;LLE deficits/detail RLE Deficits / Details: knee extension 3+; AAROM WFL except DF to neutral LLE Deficits / Details: AAROM WFL (hip in neutral and only to 90 degrees), ankle DF to neutral; 2+ hip flexion, dorsiflexion, knee extension  Cervical / Trunk Assessment: Other exceptions  Communication   Communication: HOH  Cognition Arousal/Alertness: Awake/alert Behavior During Therapy: Flat affect Overall Cognitive Status: No family/caregiver present to determine baseline cognitive functioning (h/o dementia; knew name not DOB)                      General Comments      Exercises Total Joint Exercises Ankle Circles/Pumps: AAROM;Left;5 reps Short Arc QuadBarbaraann Boys;Left (3 reps) Heel Slides: AAROM;Left;5 reps      Assessment/Plan    PT Assessment Patient needs continued PT services  PT Diagnosis Difficulty walking;Generalized weakness   PT Problem List Decreased strength;Decreased balance;Decreased mobility;Decreased cognition;Decreased knowledge of use of  DME;Decreased knowledge of precautions;Impaired sensation;Impaired tone  PT Treatment Interventions DME instruction;Gait training;Functional mobility training;Therapeutic activities;Therapeutic exercise;Cognitive remediation;Patient/family education   PT Goals (Current goals can be found in the Care Plan section) Acute Rehab PT Goals Patient Stated Goal: pt unable, but agreeable to bed exercises PT Goal Formulation: Patient unable to participate in goal setting Time For Goal Achievement: 02/11/16 Potential to Achieve Goals: Fair    Frequency Min 3X/week   Barriers to discharge        Co-evaluation               End of Session   Activity Tolerance: Patient tolerated treatment well Patient left: in bed;with call bell/phone within reach;with bed alarm set Nurse Communication: Other (comment) (unknown hip precautions or wt-bearing status)         Time: 1610-9604 PT Time Calculation (min) (ACUTE ONLY): 12 min   Charges:   PT Evaluation $PT Eval Low Complexity: 1 Procedure     PT G Codes:        Xuan Mateus 2016-02-12, 10:36 AM Pager [phone]

## 2016-01-28 NOTE — Progress Notes (Signed)
CRITICAL VALUE ALERT  Critical value received:  Na 161 and CL > 130  Date of notification:  01/28/2016  Time of notification:  0742  Critical value read back:Yes.    Nurse who received alert:  Theadora RamaKIRKMAN, Kattie Santoyo Brooke  MD notified (1st page):  Dr. Gonzella Lexhungel  Time of first page:  0745  MD notified (2nd page): n/a  Time of second page: n/a  Responding MD:  Dr. Gonzella Lexhungel  Time MD responded:  [phone], orders received

## 2016-01-28 NOTE — Progress Notes (Signed)
Speech Language Pathology Treatment: Dysphagia  Patient Details Name: Jared [name] MRN: [id number] DOB: [birth year] Today's Date: 01/28/2016 Time: 1050-1100 SLP Time Calculation (min) (ACUTE ONLY): 10 min  Assessment / Plan / Recommendation Clinical Impression  Pt seen for dysphagia followup. Pt much more alert and interactive this date. RN reports pocketing with meds presented whole with puree- requires liquid wash to clear. RN to trial whole with liquid and if pocketing continues, recommend crushed with puree. Pt trialed with PO requiring mastication- prolonged oral phase with puree and liquid wash required ultimately to facilitate adequate mastication and complete oral clearance. Recommend: Continue with Dys 1 and thin. Pt likely to remain on this diet as dentures are not available. Will sign off at this time.   HPI HPI: 75yo male with hx dementia, DM, previous CVA with residual L sided weakness initially admitted to [facility] 6/7 after a fall at home with L femoral neck fx which was surgically repaired. Post op patient was unable to wean from vent. Pt had progressive renal failure, volume overload and was tx to [facility] 6/10 for ongoing care and renal eval.       SLP Plan  All goals met     Recommendations  Diet recommendations: Dysphagia 1 (puree);Thin liquid Liquids provided via: Straw;Cup Medication Administration:  (RN to trial whole with liquid, if not crushed with puree) Supervision: Staff to assist with self feeding Compensations: Minimize environmental distractions;Slow rate;Small sips/bites             Oral Care Recommendations: Oral care QID Follow up Recommendations: Skilled Nursing facility Plan: All goals met     Standard MA, CCC-SLP 01/28/2016, 11:06 AM

## 2016-01-28 NOTE — Progress Notes (Signed)
Patient with >3 loose soft formed stools last pm.Skin care protocol in place but with 2.5cm x .5 cm brownish green spot on underside of scrotum.Scrotum elevated to lessen contact with stool. Patient more alert this am and son in to visit.

## 2016-01-28 NOTE — Progress Notes (Signed)
PROGRESS NOTE                                                                                                                                                                                                             Patient Demographics:    Jared [name], is a 75 y.o. male, DOB - 05/25/1941, ZOX:[id number]  Admit date - 01/24/2016   Admitting Physician Lupita Leash, MD  Outpatient Primary MD for the patient is Feliciana Rossetti, MD  LOS - 4  Outpatient Specialists:not on file  No chief complaint on file.      Brief Narrative   75 year old male with history of CVA with residual left-sided weakness, dementia, diabetes mellitus admitted to [facility] on 6/7 after a fall at home with left femoral neck fracture which was surgically repaired. Postoperatively he was unable to be weaned from the vent and had progressive renal failure with volume overload and transferred to [facility] on 6/10 for ongoing care and renal evaluation. Cultures were negative. Received antibiotic for possible healthcare associated pneumonia and transferred to hospitalist service once extubated on 6/12.   Subjective:   No overnight issues. Sodium continues to be high.   Assessment  & Plan :    Acute hypoxic respiratory failure Secondary to acute pulmonary edema and possible healthcare associated pneumonia. D/c abx after today. ( completes 5 days) Completed antibiotics. Now maintaining O2 sat on 2 L. Incentive spirometry. Continue DuoNeb's.  Acute on chronic diastolic CHF Currently euvolemic. Continue metoprolol. I will hold off on resuming ACE inhibitor given his acute kidney injury.  Acute kidney injury Etiology not clear. Has hypernatremia and mild hyperkalemia. Stop ACEi. Improve urine output. Renal function slowly improving.   Hypernatremia ?dehydration with overdiuresis. Check urine sodium and osmolality. Avoid further   diuretics. Added free water for hypernatremia. Will avoid D5 given hyperglycemia. Consult renal in the morning if not improved.  Diabetes mellitus with hyperglycemia Increase Lantus to home dose. (20 units daily at bedtime) Continue sliding scale coverage.  Protein calorie malnutrition added supplements  Left Femoral neck fracture Surgically repaired on 6/7. Has not required pain medication. Lovenox for DVT prophylaxis. PT recommends skilled nursing facility.  Code Status :DO NOT RESUSCITATE  Family Communication  : None at bedside  Disposition Plan  : SNF for PT.  Barriers For Discharge : Hypernatremia  Consults  :  PC CM  Procedures  :  Intubation 2-D echo  DVT Prophylaxis  :  Subcutaneous lovenox  Lab Results  Component Value Date   PLT 205 01/28/2016    Antibiotics  :   Anti-infectives    Start     Dose/Rate Route Frequency Ordered Stop   01/27/16 1800  imipenem-cilastatin (PRIMAXIN) 500 mg in sodium chloride 0.9 % 100 mL IVPB     500 mg 200 mL/hr over 30 Minutes Intravenous Every 8 hours 01/27/16 1206     01/26/16 1200  imipenem-cilastatin (PRIMAXIN) 250 mg in sodium chloride 0.9 % 100 mL IVPB  Status:  Discontinued     250 mg 200 mL/hr over 30 Minutes Intravenous Every 6 hours 01/26/16 1007 01/27/16 1206   01/25/16 0500  imipenem-cilastatin (PRIMAXIN) 250 mg in sodium chloride 0.9 % 100 mL IVPB  Status:  Discontinued     250 mg 200 mL/hr over 30 Minutes Intravenous Every 12 hours 01/24/16 1817 01/26/16 1007   01/24/16 1700  vancomycin (VANCOCIN) 2,000 mg in sodium chloride 0.9 % 500 mL IVPB     2,000 mg 250 mL/hr over 120 Minutes Intravenous  Once 01/24/16 1628 01/24/16 2038   01/24/16 1700  imipenem-cilastatin (PRIMAXIN) 500 mg in sodium chloride 0.9 % 100 mL IVPB     500 mg 200 mL/hr over 30 Minutes Intravenous  Once 01/24/16 1628 01/24/16 1821        Objective:   Filed Vitals:   01/28/16 0519 01/28/16 0809 01/28/16 1521 01/28/16 1710  BP: 133/74  137/78  122/61  Pulse: 69 71 85 76  Temp: 99.8 F (37.7 C) 98.3 F (36.8 C)  98.4 F (36.9 C)  TempSrc: Oral Oral  Axillary  Resp: 18 20 22 22   Height:      Weight:      SpO2: 91% 93% 91% 91%    Wt Readings from Last 3 Encounters:  01/27/16 98.93 kg (218 lb 1.6 oz)     Intake/Output Summary (Last 24 hours) at 01/28/16 1745 Last data filed at 01/28/16 1710  Gross per 24 hour  Intake   1780 ml  Output   1750 ml  Net     30 ml     Physical Exam  Gen: not in distress, Confused HEENT: Pallor present, dry mucosa, supple neck Chest: clear b/l, no added sounds CVS: N S1&S2, 3/6 systolic murmur, no rubs or gallop GI: soft, NT, ND, BS+ Musculoskeletal: warm, no edema CNS: AAOX1 , nonfocal    Data Review:    CBC  Recent Labs Lab 01/24/16 1633 01/25/16 0603 01/27/16 0215 01/28/16 0515  WBC 11.1* 10.8* 10.3 9.6  HGB 9.5* 9.6* 9.5* 9.8*  HCT 31.7* 31.8* 30.9* 32.8*  PLT 149* 150 166 205  MCV 99.4 97.5 95.4 98.2  MCH 29.8 29.4 29.3 29.3  MCHC 30.0 30.2 30.7 29.9*  RDW 16.1* 16.2* 16.4* 16.7*  LYMPHSABS  --   --  0.6* 1.1  MONOABS  --   --  0.8 0.7  EOSABS  --   --  0.0 0.1  BASOSABS  --   --  0.0 0.0    Chemistries   Recent Labs Lab 01/24/16 1633 01/25/16 0603 01/25/16 1655 01/26/16 0549 01/27/16 0215 01/28/16 0515  NA 143 143  --  151* 156* 161*  K 5.2* 5.4*  --  3.8 3.9 3.9  CL 116* 116*  --  122* 129* >130*  CO2 20* 17*  --  21* 21* 23  GLUCOSE 126* 292*  --  273* 250* 295*  BUN 61* 68*  --  62* 47* 42*  CREATININE 4.08* 3.89*  --  2.57* 1.98* 1.80*  CALCIUM 8.1* 8.5*  --  8.8* 8.9 8.8*  MG 1.9 1.9 1.9 1.9 1.8  --   AST 12*  --   --   --   --   --   ALT 11*  --   --   --   --   --   ALKPHOS 46  --   --   --   --   --   BILITOT 0.6  --   --   --   --   --    ------------------------------------------------------------------------------------------------------------------ No results for input(s): CHOL, HDL, LDLCALC, TRIG, CHOLHDL, LDLDIRECT in  the last 72 hours.  No results found for: HGBA1C ------------------------------------------------------------------------------------------------------------------ No results for input(s): TSH, T4TOTAL, T3FREE, THYROIDAB in the last 72 hours.  Invalid input(s): FREET3 ------------------------------------------------------------------------------------------------------------------ No results for input(s): VITAMINB12, FOLATE, FERRITIN, TIBC, IRON, RETICCTPCT in the last 72 hours.  Coagulation profile No results for input(s): INR, PROTIME in the last 168 hours.  No results for input(s): DDIMER in the last 72 hours.  Cardiac Enzymes No results for input(s): CKMB, TROPONINI, MYOGLOBIN in the last 168 hours.  Invalid input(s): CK ------------------------------------------------------------------------------------------------------------------    Component Value Date/Time   BNP 243.6* 01/24/2016 1633    Inpatient Medications  Scheduled Meds: . antiseptic oral rinse  7 mL Mouth Rinse BID  . enalapril  20 mg Oral Daily  . feeding supplement (GLUCERNA SHAKE)  237 mL Oral TID WC  . free water  300 mL Oral Q6H  . heparin  5,000 Units Subcutaneous Q8H  . imipenem-cilastatin  500 mg Intravenous Q8H  . insulin aspart  0-9 Units Subcutaneous TID WC  . insulin glargine  10 Units Subcutaneous Daily  . ipratropium-albuterol  3 mL Nebulization TID  . metoprolol tartrate  12.5 mg Per Tube q morning - 10a  . pantoprazole sodium  40 mg Per Tube Daily   Continuous Infusions:  PRN Meds:.acetaminophen (TYLENOL) oral liquid 160 mg/5 mL, ipratropium-albuterol  Micro Results Recent Results (from the past 240 hour(s))  Culture, blood (routine x 2)     Status: None (Preliminary result)   Collection Time: 01/24/16  4:33 PM  Result Value Ref Range Status   Specimen Description BLOOD BLOOD LEFT HAND  Final   Special Requests IN PEDIATRIC BOTTLE 3CC  Final   Culture NO GROWTH 4 DAYS  Final   Report  Status PENDING  Incomplete  Culture, blood (routine x 2)     Status: None (Preliminary result)   Collection Time: 01/24/16  4:41 PM  Result Value Ref Range Status   Specimen Description BLOOD BLOOD RIGHT HAND  Final   Special Requests BOTTLES DRAWN AEROBIC AND ANAEROBIC 5CC  Final   Culture NO GROWTH 4 DAYS  Final   Report Status PENDING  Incomplete  MRSA PCR Screening     Status: None   Collection Time: 01/25/16  9:23 AM  Result Value Ref Range Status   MRSA by PCR NEGATIVE NEGATIVE Final    Comment:        The GeneXpert MRSA Assay (FDA approved for NASAL specimens only), is one component of a comprehensive MRSA colonization surveillance program. It is not intended to diagnose MRSA infection nor to guide or monitor treatment for MRSA infections.   Culture, Urine     Status: None   Collection Time: 01/25/16  4:24 PM  Result Value  Ref Range Status   Specimen Description URINE, CATHETERIZED  Final   Special Requests NONE  Final   Culture NO GROWTH  Final   Report Status 01/26/2016 FINAL  Final    Radiology Reports Dg Chest Port 1 View  01/26/2016  CLINICAL DATA:  Respiratory failure. EXAM: PORTABLE CHEST 1 VIEW COMPARISON:  01/25/2016. FINDINGS: Endotracheal and NG tube in stable position. Cardiomegaly with pulmonary vascular congestion and interstitial prominence along the left side pleural effusion no suggesting congestive heart failure. Persistent infiltrate next right mid lung again noted. Continued follow-up exams again suggested to demonstrate clearing. No pneumothorax. IMPRESSION: 1. Lines and tubes in stable position. 2. Persistent infiltrate right mid lung again noted. Continued follow-up chest x-rays to demonstrate clearing suggested. 3. Cardiomegaly with new onset pulmonary venous congestion and bilateral interstitial prominence along with left-sided pleural effusion. Findings consistent with congestive heart failure. Electronically Signed   By: Maisie Fushomas  Register   On:  01/26/2016 06:52   Dg Chest Port 1 View  01/25/2016  CLINICAL DATA:  Fever. EXAM: PORTABLE CHEST 1 VIEW COMPARISON:  January 24, 2016 FINDINGS: Stable support apparatus. No pneumothorax. The focal opacity in the medial right lung base has resolved. Minimal opacity in the bases may simply represent atelectasis. No other changes. IMPRESSION: Resolution of the focal opacity in the medial right lung base. Minimal residual opacity in the bases, likely atelectasis. Electronically Signed   By: Gerome Samavid  Williams III M.D   On: 01/25/2016 08:31   Dg Chest Port 1 View  01/24/2016  CLINICAL DATA:  Respiratory failure. EXAM: PORTABLE CHEST 1 VIEW COMPARISON:  None. FINDINGS: Endotracheal tube in satisfactory position. Nasogastric tube extending into the stomach. Mildly enlarged cardiac silhouette. Patchy opacity in the right mid to lower lung zone, medially. There is also airspace opacity in the left lower lobe. Possible small left pleural effusion. Diffuse osteopenia. IMPRESSION: 1. Probable pneumonia in the right mid to lower lung zone, medially. A mass cannot be excluded and this will require follow-up. 2. Left lower lobe atelectasis or pneumonia. 3. Possible small left pleural effusion. 4. Mild cardiomegaly. Electronically Signed   By: Beckie SaltsSteven  Reid M.D.   On: 01/24/2016 17:08    Time Spent in minutes  25   Eddie NorthHUNGEL, Ronin Rehfeldt M.D on 01/28/2016 at 5:45 PM  Between 7am to 7pm - Pager - [phone]  After 7pm go to [email or web address] - password [email or web address]  [facility] -  Office  [phone]

## 2016-01-28 NOTE — Progress Notes (Signed)
Inpatient Diabetes Program Recommendations  AACE/ADA: New Consensus Statement on Inpatient Glycemic Control (2015)  Target Ranges:  Prepandial:   less than 140 mg/dL      Peak postprandial:   less than 180 mg/dL (1-2 hours)      Critically ill patients:  140 - 180 mg/dL   Lab Results  Component Value Date   GLUCAP 321* 01/28/2016    Review of Glycemic Control  Inpatient Diabetes Program Recommendations:  Increase Lantus to home dose 20 units. Increase Novolog to moderate scale. Thank you  Piedad ClimesGina Boby Eyer MSN, RN,CDE Inpatient Diabetes Coordinator [phone] (team pager)

## 2016-01-29 LAB — CULTURE, BLOOD (ROUTINE X 2)
Culture: NO GROWTH
Culture: NO GROWTH

## 2016-01-29 LAB — GLUCOSE, CAPILLARY
GLUCOSE-CAPILLARY: 283 mg/dL — AB (ref 65–99)
GLUCOSE-CAPILLARY: 123 mg/dL — AB (ref 65–99)
Glucose-Capillary: 327 mg/dL — ABNORMAL HIGH (ref 65–99)
Glucose-Capillary: 280 mg/dL — ABNORMAL HIGH (ref 65–99)

## 2016-01-29 LAB — BASIC METABOLIC PANEL
ANION GAP: 9 (ref 5–15)
Anion gap: 7 (ref 5–15)
BUN: 40 mg/dL — ABNORMAL HIGH (ref 6–20)
BUN: 40 mg/dL — AB (ref 6–20)
CALCIUM: 8.3 mg/dL — AB (ref 8.9–10.3)
CALCIUM: 8.3 mg/dL — AB (ref 8.9–10.3)
CHLORIDE: 128 mmol/L — AB (ref 101–111)
CO2: 24 mmol/L (ref 22–32)
CO2: 24 mmol/L (ref 22–32)
CREATININE: 1.84 mg/dL — AB (ref 0.61–1.24)
Chloride: 127 mmol/L — ABNORMAL HIGH (ref 101–111)
Creatinine, Ser: 1.76 mg/dL — ABNORMAL HIGH (ref 0.61–1.24)
GFR, EST AFRICAN AMERICAN: 43 mL/min — AB (ref >60)
GFR, EST AFRICAN AMERICAN: 41 mL/min — AB (ref >60)
GFR, EST NON AFRICAN AMERICAN: 37 mL/min — AB (ref >60)
GFR, EST NON AFRICAN AMERICAN: 35 mL/min — AB (ref >60)
Glucose, Bld: 345 mg/dL — ABNORMAL HIGH (ref 65–99)
Glucose, Bld: 189 mg/dL — ABNORMAL HIGH (ref 65–99)
POTASSIUM: 4.1 mmol/L (ref 3.5–5.1)
Potassium: 3.4 mmol/L — ABNORMAL LOW (ref 3.5–5.1)
SODIUM: 159 mmol/L — AB (ref 135–145)
Sodium: 160 mmol/L — ABNORMAL HIGH (ref 135–145)

## 2016-01-29 MED ORDER — INSULIN GLARGINE 100 UNIT/ML ~~LOC~~ SOLN: 30.0000 [IU] | Freq: Every day | SUBCUTANEOUS | Status: DC

## 2016-01-29 MED ORDER — CITALOPRAM HYDROBROMIDE 20 MG PO TABS
20.0000 mg | ORAL_TABLET | Freq: Every day | ORAL | Status: DC
  Administered 2016-01-29 – 2016-02-03 (×6): 20 mg via ORAL
  Filled 2016-01-29 (×6): qty 1

## 2016-01-29 MED ORDER — INSULIN ASPART 100 UNIT/ML ~~LOC~~ SOLN
0.0000 [IU] | Freq: Three times a day (TID) | SUBCUTANEOUS | Status: DC
  Administered 2016-01-29 (×2): 8 [IU] via SUBCUTANEOUS
  Administered 2016-01-30: 2 [IU] via SUBCUTANEOUS
  Administered 2016-01-30 (×2): 5 [IU] via SUBCUTANEOUS
  Administered 2016-01-31: 2 [IU] via SUBCUTANEOUS
  Administered 2016-01-31: 15 [IU] via SUBCUTANEOUS
  Administered 2016-01-31: 3 [IU] via SUBCUTANEOUS
  Administered 2016-02-01: 8 [IU] via SUBCUTANEOUS
  Administered 2016-02-01: 11 [IU] via SUBCUTANEOUS
  Administered 2016-02-02: 3 [IU] via SUBCUTANEOUS
  Administered 2016-02-02: 2 [IU] via SUBCUTANEOUS
  Administered 2016-02-03: 8 [IU] via SUBCUTANEOUS
  Administered 2016-02-03: 3 [IU] via SUBCUTANEOUS
  Administered 2016-02-03: 8 [IU] via SUBCUTANEOUS

## 2016-01-29 MED ORDER — RIVASTIGMINE 13.3 MG/24HR TD PT24: 13.3000 mg | MEDICATED_PATCH | Freq: Every day | TRANSDERMAL | Status: DC

## 2016-01-29 MED ORDER — METOPROLOL TARTRATE 12.5 MG HALF TABLET: 12.5000 mg | ORAL_TABLET | Freq: Every day | ORAL | Status: DC

## 2016-01-29 MED ORDER — INSULIN ASPART 100 UNIT/ML ~~LOC~~ SOLN: 0.0000 [IU] | Freq: Three times a day (TID) | SUBCUTANEOUS | Status: DC

## 2016-01-29 MED ORDER — INSULIN ASPART 100 UNIT/ML ~~LOC~~ SOLN: 3.0000 [IU] | Freq: Three times a day (TID) | SUBCUTANEOUS | Status: DC

## 2016-01-29 MED ORDER — PRAVASTATIN SODIUM 40 MG PO TABS
40.0000 mg | ORAL_TABLET | Freq: Every day | ORAL | Status: DC
  Administered 2016-01-29 – 2016-02-02 (×4): 40 mg via ORAL
  Filled 2016-01-29 (×5): qty 1

## 2016-01-29 MED ORDER — ASPIRIN-DIPYRIDAMOLE ER 25-200 MG PO CP12
1.0000 | ORAL_CAPSULE | Freq: Two times a day (BID) | ORAL | Status: DC
  Administered 2016-01-29 – 2016-02-03 (×10): 1 via ORAL
  Filled 2016-01-29 (×13): qty 1

## 2016-01-29 MED ORDER — NIACIN ER 500 MG PO TBCR
500.0000 mg | EXTENDED_RELEASE_TABLET | Freq: Every day | ORAL | Status: DC
  Administered 2016-01-29 – 2016-02-02 (×4): 500 mg via ORAL
  Filled 2016-01-29 (×8): qty 1

## 2016-01-29 MED ORDER — RIVASTIGMINE 13.3 MG/24HR TD PT24
13.3000 mg | MEDICATED_PATCH | Freq: Every day | TRANSDERMAL | Status: DC
  Administered 2016-01-31 – 2016-02-10 (×11): 13.3 mg via TRANSDERMAL
  Filled 2016-01-29 (×13): qty 1

## 2016-01-29 MED ORDER — RIVASTIGMINE 4.6 MG/24HR TD PT24: 13.3000 mg | MEDICATED_PATCH | Freq: Every day | TRANSDERMAL | Status: DC

## 2016-01-29 MED ORDER — ISOSORBIDE MONONITRATE ER 30 MG PO TB24
30.0000 mg | ORAL_TABLET | Freq: Every day | ORAL | Status: DC
  Administered 2016-01-29 – 2016-02-01 (×4): 30 mg via ORAL
  Filled 2016-01-29 (×4): qty 1

## 2016-01-29 MED ORDER — QUETIAPINE FUMARATE 25 MG PO TABS
25.0000 mg | ORAL_TABLET | Freq: Every day | ORAL | Status: DC
  Administered 2016-01-29 – 2016-02-01 (×4): 25 mg via ORAL
  Filled 2016-01-29 (×4): qty 1

## 2016-01-29 MED ORDER — INSULIN GLARGINE 100 UNIT/ML ~~LOC~~ SOLN
30.0000 [IU] | Freq: Every day | SUBCUTANEOUS | Status: DC
  Administered 2016-01-29 – 2016-01-30 (×2): 30 [IU] via SUBCUTANEOUS
  Filled 2016-01-29 (×3): qty 0.3

## 2016-01-29 MED ORDER — DEXTROSE 5 % IV SOLN
INTRAVENOUS | Status: DC
  Administered 2016-01-29: 11:00:00 via INTRAVENOUS

## 2016-01-29 MED ORDER — TAMSULOSIN HCL 0.4 MG PO CAPS
0.4000 mg | ORAL_CAPSULE | Freq: Every day | ORAL | Status: DC
  Administered 2016-01-29 – 2016-01-31 (×3): 0.4 mg via ORAL
  Filled 2016-01-29 (×3): qty 1

## 2016-01-29 MED ORDER — INSULIN ASPART 100 UNIT/ML ~~LOC~~ SOLN
5.0000 [IU] | Freq: Three times a day (TID) | SUBCUTANEOUS | Status: DC
  Administered 2016-01-29 – 2016-01-30 (×4): 5 [IU] via SUBCUTANEOUS

## 2016-01-29 MED ORDER — PANTOPRAZOLE SODIUM 40 MG PO TBEC: 40.0000 mg | DELAYED_RELEASE_TABLET | Freq: Every day | ORAL | Status: DC

## 2016-01-29 MED ORDER — MEMANTINE HCL ER 28 MG PO CP24
28.0000 mg | ORAL_CAPSULE | Freq: Every day | ORAL | Status: DC
  Administered 2016-01-29 – 2016-02-02 (×4): 28 mg via ORAL
  Filled 2016-01-29 (×6): qty 1

## 2016-01-29 MED ORDER — INSULIN ASPART 100 UNIT/ML ~~LOC~~ SOLN
0.0000 [IU] | Freq: Every day | SUBCUTANEOUS | Status: DC
  Administered 2016-01-30: 3 [IU] via SUBCUTANEOUS
  Administered 2016-02-02 – 2016-02-03 (×2): 2 [IU] via SUBCUTANEOUS

## 2016-01-29 MED ORDER — INSULIN GLARGINE 100 UNIT/ML ~~LOC~~ SOLN
26.0000 [IU] | Freq: Every day | SUBCUTANEOUS | Status: DC
  Filled 2016-01-29: qty 0.26

## 2016-01-29 NOTE — Care Management Important Message (Signed)
Important Message  Patient Details  Name: Jared [name] MRN: [id number] Date of Birth: April 23, 1941   Medicare Important Message Given:  Yes    Bernadette HoitShoffner, Aum Caggiano Coleman 01/29/2016, 10:02 AM

## 2016-01-29 NOTE — Progress Notes (Signed)
PROGRESS NOTE                                                                                                                                                                                                             Patient Demographics:    Jared [name], is a 75 y.o. male, DOB - Feb 06, 1941, ZOX:[id number]  Admit date - 01/24/2016   Admitting Physician Lupita Leash, MD  Outpatient Primary MD for the patient is Feliciana Rossetti, MD  LOS - 5  Outpatient Specialists:not on file  No chief complaint on file.      Brief Narrative   75 year old male with history of CVA with residual left-sided weakness, dementia, diabetes mellitus admitted to [facility] on 6/7 after a fall at home with left femoral neck fracture which was surgically repaired. Postoperatively he was unable to be weaned from the vent and had progressive renal failure with volume overload and transferred to [facility] on 6/10 for ongoing care and renal evaluation. Cultures were negative. Received antibiotic for possible healthcare associated pneumonia and transferred to hospitalist service once extubated on 6/12.   Subjective:   Patient continues to drink plenty of liquid as instructed. Denies any symptoms.   Assessment  & Plan :    Acute hypoxic respiratory failure Secondary to acute pulmonary edema and possible healthcare associated pneumonia. Day/6/7 abx. Now maintaining O2 sat on 2 L. Incentive spirometry. Continue DuoNeb's.  Acute on chronic diastolic CHF Currently euvolemic. Continue metoprolol.hold off  ACE inhibitor given his acute kidney injury.  Acute kidney injury Etiology not clear. Has hypernatremia and mild hyperkalemia. Stop ACEi. Renal function slowly improving. Has significant urine output.  Hypernatremia Likely dehydration with overdiuresis. Has significant urine output >2 L. Urine sodium and also likely reviewed.. Avoid further   diuretics. sodium not improved despite adequate free water by mouth. Discussed with nephrology. Recommends adding D5 to support hydration. Monitor labs closely.   Diabetes mellitus with hyperglycemia Lantus increased to 30 units daily with limited coverage. (Patient on D5 as well). Hold metformin.  Protein calorie malnutrition added supplements  Left Femoral neck fracture Surgically repaired on 6/7. Has not required pain medication. Lovenox for DVT prophylaxis. Will obtain records from [place] regarding weightbearing precautions. PT recommends skilled nursing facility.  CVA with right hemiparesis Continue Aggrenox and statin.  ? Vascular dementia Resume namenda and  excelon patch  ? Depression  resume seroquel    Code Status :DO NOT RESUSCITATE  Family Communication  : None at bedside. Called wife and left a message.  Disposition Plan  : SNF for PT.  Barriers For Discharge : Hypernatremia  Consults  :  PC CM  Procedures  :  Intubation 2-D echo  DVT Prophylaxis  :  Subcutaneous lovenox  Lab Results  Component Value Date   PLT 205 01/28/2016    Antibiotics  :   Anti-infectives    Start     Dose/Rate Route Frequency Ordered Stop   01/27/16 1800  imipenem-cilastatin (PRIMAXIN) 500 mg in sodium chloride 0.9 % 100 mL IVPB     500 mg 200 mL/hr over 30 Minutes Intravenous Every 8 hours 01/27/16 1206 02/01/16 1359   01/26/16 1200  imipenem-cilastatin (PRIMAXIN) 250 mg in sodium chloride 0.9 % 100 mL IVPB  Status:  Discontinued     250 mg 200 mL/hr over 30 Minutes Intravenous Every 6 hours 01/26/16 1007 01/27/16 1206   01/25/16 0500  imipenem-cilastatin (PRIMAXIN) 250 mg in sodium chloride 0.9 % 100 mL IVPB  Status:  Discontinued     250 mg 200 mL/hr over 30 Minutes Intravenous Every 12 hours 01/24/16 1817 01/26/16 1007   01/24/16 1700  vancomycin (VANCOCIN) 2,000 mg in sodium chloride 0.9 % 500 mL IVPB     2,000 mg 250 mL/hr over 120 Minutes Intravenous  Once  01/24/16 1628 01/24/16 2038   01/24/16 1700  imipenem-cilastatin (PRIMAXIN) 500 mg in sodium chloride 0.9 % 100 mL IVPB     500 mg 200 mL/hr over 30 Minutes Intravenous  Once 01/24/16 1628 01/24/16 1821        Objective:   Filed Vitals:   01/28/16 2052 01/29/16 0516 01/29/16 0900 01/29/16 0956  BP: 130/67 129/85 133/71   Pulse: 83 87 74   Temp: 98.4 F (36.9 C) 98.1 F (36.7 C) 98.3 F (36.8 C)   TempSrc: Oral Oral Oral   Resp: 20 18 20    Height:      Weight:  98.158 kg (216 lb 6.4 oz)    SpO2: 93% 93% 92% 88%    Wt Readings from Last 3 Encounters:  01/29/16 98.158 kg (216 lb 6.4 oz)     Intake/Output Summary (Last 24 hours) at 01/29/16 1411 Last data filed at 01/29/16 1116  Gross per 24 hour  Intake   1780 ml  Output   1505 ml  Net    275 ml     Physical Exam  Gen: not in distress, Confused HEENT:  dry mucosa, supple neck Chest: clear b/l, no added sounds CVS: N S1&S2, 3/6 systolic murmur, no rubs or gallop GI: soft, NT, ND, BS+ Musculoskeletal: warm, no edema CNS: AAOX1-2 , nonfocal    Data Review:    CBC  Recent Labs Lab 01/24/16 1633 01/25/16 0603 01/27/16 0215 01/28/16 0515  WBC 11.1* 10.8* 10.3 9.6  HGB 9.5* 9.6* 9.5* 9.8*  HCT 31.7* 31.8* 30.9* 32.8*  PLT 149* 150 166 205  MCV 99.4 97.5 95.4 98.2  MCH 29.8 29.4 29.3 29.3  MCHC 30.0 30.2 30.7 29.9*  RDW 16.1* 16.2* 16.4* 16.7*  LYMPHSABS  --   --  0.6* 1.1  MONOABS  --   --  0.8 0.7  EOSABS  --   --  0.0 0.1  BASOSABS  --   --  0.0 0.0    Chemistries   Recent Labs Lab 01/24/16 1633 01/25/16 0603  01/25/16 1655 01/26/16 0549 01/27/16 0215 01/28/16 0515 01/28/16 1810 01/29/16 0541  NA 143 143  --  151* 156* 161* 158* 160*  K 5.2* 5.4*  --  3.8 3.9 3.9 3.6 4.1  CL 116* 116*  --  122* 129* >130* 129* 127*  CO2 20* 17*  --  21* 21* 23 24 24   GLUCOSE 126* 292*  --  273* 250* 295* 262* 345*  BUN 61* 68*  --  62* 47* 42* 40* 40*  CREATININE 4.08* 3.89*  --  2.57* 1.98* 1.80*  1.82* 1.76*  CALCIUM 8.1* 8.5*  --  8.8* 8.9 8.8* 8.7* 8.3*  MG 1.9 1.9 1.9 1.9 1.8  --   --   --   AST 12*  --   --   --   --   --   --   --   ALT 11*  --   --   --   --   --   --   --   ALKPHOS 46  --   --   --   --   --   --   --   BILITOT 0.6  --   --   --   --   --   --   --    ------------------------------------------------------------------------------------------------------------------ No results for input(s): CHOL, HDL, LDLCALC, TRIG, CHOLHDL, LDLDIRECT in the last 72 hours.  No results found for: HGBA1C ------------------------------------------------------------------------------------------------------------------ No results for input(s): TSH, T4TOTAL, T3FREE, THYROIDAB in the last 72 hours.  Invalid input(s): FREET3 ------------------------------------------------------------------------------------------------------------------ No results for input(s): VITAMINB12, FOLATE, FERRITIN, TIBC, IRON, RETICCTPCT in the last 72 hours.  Coagulation profile No results for input(s): INR, PROTIME in the last 168 hours.  No results for input(s): DDIMER in the last 72 hours.  Cardiac Enzymes No results for input(s): CKMB, TROPONINI, MYOGLOBIN in the last 168 hours.  Invalid input(s): CK ------------------------------------------------------------------------------------------------------------------    Component Value Date/Time   BNP 243.6* 01/24/2016 1633    Inpatient Medications  Scheduled Meds: . antiseptic oral rinse  7 mL Mouth Rinse BID  . feeding supplement (GLUCERNA SHAKE)  237 mL Oral TID WC  . free water  300 mL Oral Q6H  . heparin  5,000 Units Subcutaneous Q8H  . imipenem-cilastatin  500 mg Intravenous Q8H  . insulin aspart  0-15 Units Subcutaneous TID WC  . insulin aspart  0-5 Units Subcutaneous QHS  . insulin aspart  5 Units Subcutaneous TID WC  . insulin glargine  30 Units Subcutaneous Daily  . ipratropium-albuterol  3 mL Nebulization TID  . metoprolol  tartrate  12.5 mg Oral BID  . pantoprazole  40 mg Oral Daily   Continuous Infusions: . dextrose 75 mL/hr at 01/29/16 1116   PRN Meds:.acetaminophen (TYLENOL) oral liquid 160 mg/5 mL, ipratropium-albuterol  Micro Results Recent Results (from the past 240 hour(s))  Culture, blood (routine x 2)     Status: None (Preliminary result)   Collection Time: 01/24/16  4:33 PM  Result Value Ref Range Status   Specimen Description BLOOD BLOOD LEFT HAND  Final   Special Requests IN PEDIATRIC BOTTLE 3CC  Final   Culture NO GROWTH 4 DAYS  Final   Report Status PENDING  Incomplete  Culture, blood (routine x 2)     Status: None (Preliminary result)   Collection Time: 01/24/16  4:41 PM  Result Value Ref Range Status   Specimen Description BLOOD BLOOD RIGHT HAND  Final   Special Requests BOTTLES DRAWN AEROBIC AND ANAEROBIC 5CC  Final   Culture NO GROWTH 4 DAYS  Final   Report Status PENDING  Incomplete  MRSA PCR Screening     Status: None   Collection Time: 01/25/16  9:23 AM  Result Value Ref Range Status   MRSA by PCR NEGATIVE NEGATIVE Final    Comment:        The GeneXpert MRSA Assay (FDA approved for NASAL specimens only), is one component of a comprehensive MRSA colonization surveillance program. It is not intended to diagnose MRSA infection nor to guide or monitor treatment for MRSA infections.   Culture, Urine     Status: None   Collection Time: 01/25/16  4:24 PM  Result Value Ref Range Status   Specimen Description URINE, CATHETERIZED  Final   Special Requests NONE  Final   Culture NO GROWTH  Final   Report Status 01/26/2016 FINAL  Final    Radiology Reports Dg Chest Port 1 View  01/26/2016  CLINICAL DATA:  Respiratory failure. EXAM: PORTABLE CHEST 1 VIEW COMPARISON:  01/25/2016. FINDINGS: Endotracheal and NG tube in stable position. Cardiomegaly with pulmonary vascular congestion and interstitial prominence along the left side pleural effusion no suggesting congestive heart  failure. Persistent infiltrate next right mid lung again noted. Continued follow-up exams again suggested to demonstrate clearing. No pneumothorax. IMPRESSION: 1. Lines and tubes in stable position. 2. Persistent infiltrate right mid lung again noted. Continued follow-up chest x-rays to demonstrate clearing suggested. 3. Cardiomegaly with new onset pulmonary venous congestion and bilateral interstitial prominence along with left-sided pleural effusion. Findings consistent with congestive heart failure. Electronically Signed   By: Maisie Fus  Register   On: 01/26/2016 06:52   Dg Chest Port 1 View  01/25/2016  CLINICAL DATA:  Fever. EXAM: PORTABLE CHEST 1 VIEW COMPARISON:  January 24, 2016 FINDINGS: Stable support apparatus. No pneumothorax. The focal opacity in the medial right lung base has resolved. Minimal opacity in the bases may simply represent atelectasis. No other changes. IMPRESSION: Resolution of the focal opacity in the medial right lung base. Minimal residual opacity in the bases, likely atelectasis. Electronically Signed   By: Gerome Sam III M.D   On: 01/25/2016 08:31   Dg Chest Port 1 View  01/24/2016  CLINICAL DATA:  Respiratory failure. EXAM: PORTABLE CHEST 1 VIEW COMPARISON:  None. FINDINGS: Endotracheal tube in satisfactory position. Nasogastric tube extending into the stomach. Mildly enlarged cardiac silhouette. Patchy opacity in the right mid to lower lung zone, medially. There is also airspace opacity in the left lower lobe. Possible small left pleural effusion. Diffuse osteopenia. IMPRESSION: 1. Probable pneumonia in the right mid to lower lung zone, medially. A mass cannot be excluded and this will require follow-up. 2. Left lower lobe atelectasis or pneumonia. 3. Possible small left pleural effusion. 4. Mild cardiomegaly. Electronically Signed   By: Beckie Salts M.D.   On: 01/24/2016 17:08    Time Spent in minutes  25   Eddie North M.D on 01/29/2016 at 2:11 PM  Between 7am to  7pm - Pager - [phone]  After 7pm go to [email or web address] - password [email or web address]  [facility] -  Office  [phone]

## 2016-01-29 NOTE — Progress Notes (Signed)
Pharmacy Antibiotic Note Jared [name] is a 75 y.o. male admitted on 01/24/2016 with ?HCAP. Currently on day 5 of 7 of Imipenem for treatment.   WBC now wnl, afebrile. Cr continues to improve to 1.8 (CrCl ~40 - 50 ml/min).  Plan: 1. Continue imipenem 500 mg q8h  2. F/u cx, clinical status  Height: 6' (182.9 cm) Weight: 216 lb 6.4 oz (98.158 kg) IBW/kg (Calculated) : 77.6  Temp (24hrs), Avg:98.3 F (36.8 C), Min:98.1 F (36.7 C), Max:98.4 F (36.9 C)   Recent Labs Lab 01/24/16 1633 01/25/16 0603 01/26/16 0549 01/27/16 0215 01/28/16 0515 01/28/16 1810  WBC 11.1* 10.8*  --  10.3 9.6  --   CREATININE 4.08* 3.89* 2.57* 1.98* 1.80* 1.82*    Estimated Creatinine Clearance: 44.5 mL/min (by C-G formula based on Cr of 1.82).    Allergies  Allergen Reactions  . Penicillins Hives    Early childhood reaction - no other information available    Antimicrobials this admission:  Imipenem 6/10 >>  Vancomycin 6/10 >> x1  Dose adjustments this admission: Imipenem 250 mg q12h increased to q6h on 6/12  Microbiology results: 6/10 BCx: ngtd 6/11 UCx: ngF  6/10 MRSA PCR: neg  Thank you for allowing pharmacy to be a part of this patient's care.  Pollyann SamplesAndy Marysol Wellnitz, PharmD, BCPS 01/29/2016, 8:08 AM Pager: [phone] \

## 2016-01-29 NOTE — Care Management Note (Addendum)
Case Management Note  Patient Details  Name: Kamrin Sibley MRN: [id number] Date of Birth: May 16, 1941  Subjective/Objective:              CM following for progression and d/c planning.       Action/Plan: 01/29/2016 Chart reviewed, noted hx of dementia and PT eval , awaiting info re hip repair in order to progress with PT.  Pt is most likelyy require rehab post hospitalization.  Will follow for family preferences.. CSW notified of possible SNF needs. Will  Continue to follow for needs.  6/15/1:45 pm met with pt wife and daughter, per family plan is for SNF for rehab. Pt has previously been in SNF @ [facility] in [place] and [facility] in [place].  Family informed that CSW will assist with SNF placement for rehab and will meet with pt and family to begin search for rehab center prior to time for d/c. CSW, Crawford Givens informed.   Expected Discharge Date:                  Expected Discharge Plan:  Skilled Nursing Facility  In-House Referral:  Clinical Social Work  Discharge planning Services  CM Consult  Post Acute Care Choice:    Choice offered to:     DME Arranged:    DME Agency:     HH Arranged:    [place] Agency:     Status of Service:  In process, will continue to follow  Medicare Important Message Given:  Yes Date Medicare IM Given:    Medicare IM give by:    Date Additional Medicare IM Given:    Additional Medicare Important Message give by:     If discussed at Point of Stay Meetings, dates discussed:    Additional Comments:  Adron Bene, RN 01/29/2016, 1:30 PM

## 2016-01-30 LAB — BASIC METABOLIC PANEL
ANION GAP: 6 (ref 5–15)
Anion gap: 5 (ref 5–15)
BUN: 40 mg/dL — ABNORMAL HIGH (ref 6–20)
BUN: 39 mg/dL — AB (ref 6–20)
CO2: 24 mmol/L (ref 22–32)
CO2: 23 mmol/L (ref 22–32)
CREATININE: 2.07 mg/dL — AB (ref 0.61–1.24)
Calcium: 8.2 mg/dL — ABNORMAL LOW (ref 8.9–10.3)
Calcium: 8 mg/dL — ABNORMAL LOW (ref 8.9–10.3)
Chloride: 129 mmol/L — ABNORMAL HIGH (ref 101–111)
Chloride: 127 mmol/L — ABNORMAL HIGH (ref 101–111)
Creatinine, Ser: 1.91 mg/dL — ABNORMAL HIGH (ref 0.61–1.24)
GFR calc Af Amer: 39 mL/min — ABNORMAL LOW (ref >60)
GFR calc Af Amer: 35 mL/min — ABNORMAL LOW (ref >60)
GFR, EST NON AFRICAN AMERICAN: 33 mL/min — AB (ref >60)
GFR, EST NON AFRICAN AMERICAN: 30 mL/min — AB (ref >60)
GLUCOSE: 239 mg/dL — AB (ref 65–99)
GLUCOSE: 266 mg/dL — AB (ref 65–99)
POTASSIUM: 3.7 mmol/L (ref 3.5–5.1)
POTASSIUM: 3.5 mmol/L (ref 3.5–5.1)
SODIUM: 159 mmol/L — AB (ref 135–145)
Sodium: 155 mmol/L — ABNORMAL HIGH (ref 135–145)

## 2016-01-30 LAB — GLUCOSE, CAPILLARY
GLUCOSE-CAPILLARY: 282 mg/dL — AB (ref 65–99)
GLUCOSE-CAPILLARY: 298 mg/dL — AB (ref 65–99)
Glucose-Capillary: 223 mg/dL — ABNORMAL HIGH (ref 65–99)
Glucose-Capillary: 142 mg/dL — ABNORMAL HIGH (ref 65–99)
Glucose-Capillary: 220 mg/dL — ABNORMAL HIGH (ref 65–99)

## 2016-01-30 MED ORDER — DEXTROSE 5 % IV SOLN
INTRAVENOUS | Status: DC
  Administered 2016-01-30 – 2016-01-31 (×3): via INTRAVENOUS

## 2016-01-30 NOTE — Progress Notes (Signed)
Pharmacy Antibiotic Note Jared SalvageRonald [name] is a 75 y.o. male admitted on 01/24/2016 with ?HCAP.  Currently on day 6 of 7 of imipenem for treatment.   WBC now wnl, afebrile. Cr continues to improve to 1.9 (CrCl ~40 - 50 ml/min).  Plan: 1. Continue imipenem 500 mg q8h (stop date in place) 2. F/u cx, clinical status  Height: 6' (182.9 cm) Weight: 218 lb 9.6 oz (99.156 kg) IBW/kg (Calculated) : 77.6  Temp (24hrs), Avg:98.8 F (37.1 C), Min:98.1 F (36.7 C), Max:99.6 F (37.6 C)   Recent Labs Lab 01/24/16 1633 01/25/16 0603  01/27/16 0215 01/28/16 0515 01/28/16 1810 01/29/16 0541 01/29/16 1836 01/30/16 0614  WBC 11.1* 10.8*  --  10.3 9.6  --   --   --   --   CREATININE 4.08* 3.89*  < > 1.98* 1.80* 1.82* 1.76* 1.84* 1.91*  < > = values in this interval not displayed.  Estimated Creatinine Clearance: 42.6 mL/min (by C-G formula based on Cr of 1.91).    Allergies  Allergen Reactions  . Penicillins Hives    Early childhood reaction - no other information available    Antimicrobials this admission:  Imipenem 6/10 >> 6/18 (am) Vancomycin 6/10 >> x1  Dose adjustments this admission: Imipenem 250 mg q12h increased to q6h on 6/12  Microbiology results: 6/10 BCx: ngF 6/11 UCx: ngF  6/10 MRSA PCR: neg  Thank you for allowing pharmacy to be a part of this patient's care.  Pollyann SamplesAndy Etna [name], PharmD, BCPS 01/30/2016, 8:07 AM Pager: [phone] \

## 2016-01-30 NOTE — Clinical Social Work Placement (Signed)
   CLINICAL SOCIAL WORK PLACEMENT  NOTE  Date:  01/30/2016  Patient Details  Name: Jared [name] MRN: [id number] Date of Birth: 1940-10-20  Clinical Social Work is seeking post-discharge placement for this patient at the Skilled  Nursing Facility level of care (*CSW will initial, date and re-position this form in  chart as items are completed):  Yes   Patient/family provided with [facility] Clinical Social Work Department's list of facilities offering this level of care within the geographic area requested by the patient (or if unable, by the patient's family).  Yes   Patient/family informed of their freedom to choose among providers that offer the needed level of care, that participate in Medicare, Medicaid or managed care program needed by the patient, have an available bed and are willing to accept the patient.  Yes   Patient/family informed of [facility]'s ownership interest in [facility] and [facility], as well as of the fact that they are under no obligation to receive care at these facilities.  PASRR submitted to EDS on       PASRR number received on       Existing PASRR number confirmed on 01/30/16     FL2 transmitted to all facilities in geographic area requested by pt/family on 01/30/16     FL2 transmitted to all facilities within larger geographic area on       Patient informed that his/her managed care company has contracts with or will negotiate with certain facilities, including the following:            Patient/family informed of bed offers received.  Patient chooses bed at       Physician recommends and patient chooses bed at      Patient to be transferred to   on  .  Patient to be transferred to facility by       Patient family notified on   of transfer.  Name of family member notified:        PHYSICIAN       Additional Comment:    _______________________________________________ Cristobal Goldmannrawford, Zyrion Coey Bradley, LCSW 01/30/2016, 5:33 PM

## 2016-01-30 NOTE — Care Management Important Message (Signed)
Important Message  Patient Details  Name: Jared SalvageRonald Sollars MRN: [id number] Date of Birth: Dec 29, 1940   Medicare Important Message Given:  Yes    Bernadette HoitShoffner, Tierra Thoma Coleman 01/30/2016, 9:34 AM

## 2016-01-30 NOTE — Progress Notes (Signed)
Physical Therapy Treatment Patient Details Name: Jared [name] MRN: [id number] DOB: Dec 06, 1940 Today's Date: 01/30/2016    History of Present Illness 74yo male with hx dementia, DM, previous CVA with residual L sided weakness [facility] after a fall at home with L femoral neck fx which was surgically repaired. Post op patient was unable to wean from vent. Pt had progressive renal failure, volume overload and was tx to [facility] 6/10 for ongoing care and renal eval.     PT Comments    Pt was able to get up to the chair after getting permissions from MD with pt being entirely unfamiliar with his hip precautions.  Pt in fact was under the impression hip was done last year (April 2017).  Have had nursing available to assist and will need to reinforce information with pt as PT follows acutely  Follow Up Recommendations  SNF     Equipment Recommendations  None recommended by PT    Recommendations for Other Services       Precautions / Restrictions Precautions Precautions: Fall;Anterior Hip Precaution Booklet Issued: No Precaution Comments: just have received information about WBAT and anterolateral L hip precautions Restrictions Weight Bearing Restrictions: Yes LLE Weight Bearing: Weight bearing as tolerated    Mobility  Bed Mobility Overal bed mobility: Needs Assistance Bed Mobility: Rolling;Supine to Sit Rolling: Mod assist   Supine to sit: Max assist     General bed mobility comments: Pt has increased control with HOB elevated and using bedrail, following instructions from PT  Transfers Overall transfer level: Needs assistance Equipment used: 2 person hand held assist Transfers: Lateral/Scoot Transfers          Lateral/Scoot Transfers: Max assist;+2 physical assistance;+2 safety/equipment;From elevated surface General transfer comment: nursing assisted to instruct on return to bed  Ambulation/Gait             General Gait Details:  unable to stand with PT   Stairs            Wheelchair Mobility    Modified Rankin (Stroke Patients Only)       Balance Overall balance assessment: Needs assistance Sitting-balance support: Feet supported Sitting balance-Leahy Scale: Fair   Postural control: Posterior lean Standing balance support: Bilateral upper extremity supported Standing balance-Leahy Scale: Zero                      Cognition Arousal/Alertness: Awake/alert Behavior During Therapy: Flat affect Overall Cognitive Status: History of cognitive impairments - at baseline       Memory: Decreased short-term memory;Decreased recall of precautions              Exercises      General Comments General comments (skin integrity, edema, etc.): Pt is quite weak and despite instructions for his precautions could not remember them to repeat back to PT      Pertinent Vitals/Pain Pain Assessment: No/denies pain    Home Living                      Prior Function            PT Goals (current goals can now be found in the care plan section) Acute Rehab PT Goals Patient Stated Goal: to get better Progress towards PT goals: Progressing toward goals    Frequency  Min 3X/week    PT Plan Current plan remains appropriate    Co-evaluation  End of Session Equipment Utilized During Treatment: Oxygen Activity Tolerance: Patient limited by fatigue;Patient limited by lethargy Patient left: in chair;with call bell/phone within reach;with chair alarm set;with nursing/sitter in room     Time: 1101-1136 PT Time Calculation (min) (ACUTE ONLY): 35 min  Charges:  $Therapeutic Activity: 23-37 mins                    G Codes:      Ivar Drape 02-18-16, 1:50 PM    Samul Dada, PT MS Acute Rehab Dept. Number: [facility] [phone] and [facility] [phone]

## 2016-01-30 NOTE — NC FL2 (Signed)
[place] MEDICAID FL2 LEVEL OF CARE SCREENING TOOL     IDENTIFICATION  Patient Name: Jared [name] Birthdate: 03/25/1941 Sex: male Admission Date (Current Location): 01/24/2016  [facility] and [place] Number:  Producer, television/film/video and Address:  [facility] [place], [address], [place], [place] [address]      Provider Number: [id number]  Attending Physician Name and Address:  Eddie North, MD  Relative Name and Phone Number:  Jamine Highfill - spouse. Phone #[phone]    Current Level of Care: Hospital Recommended Level of Care: Skilled Nursing Facility Prior Approval Number:    Date Approved/Denied:   PASRR Number: [id number] (Eff. 12/30/09)  Discharge Plan: SNF    Current Diagnoses: Patient Active Problem List   Diagnosis Date Noted  . Uncontrolled diabetes mellitus with hyperglycemia, with long-term current use of insulin (HCC) 01/28/2016  . Hypernatremia 01/28/2016  . Acute kidney injury (HCC) 01/28/2016  . Acute respiratory failure with hypoxia (HCC) 01/28/2016  . HCAP (healthcare-associated pneumonia) 01/28/2016  . 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 Acute respiratory failure (HCC) 01/24/2016  . Diabetes mellitus 03/25/2011  . Hearing loss 03/25/2011  . GERD (gastroesophageal reflux disease) 03/25/2011  . High blood pressure 03/25/2011  . Anxiety 03/25/2011    Orientation RESPIRATION BLADDER Height & Weight     Self  Normal Continent Weight: 218 lb 9.6 oz (99.156 kg) Height:  6' (182.9 cm)  BEHAVIORAL SYMPTOMS/MOOD NEUROLOGICAL BOWEL NUTRITION STATUS      Continent Diet (DYS1)  AMBULATORY STATUS COMMUNICATION OF NEEDS Skin   Total Care (Hip fracture, surgically repaired.) Verbally Other (Comment) (Ecchymosis arm and hand with foam dressing. Skin tear left lower arm with foam dressing.. Moisture associated skin damage to scrotum.)                       Personal Care Assistance Level of Assistance  Bathing, Feeding, Dressing (Patient had  fall at home with left femoral neck fracture which was surgically repaired.) Bathing Assistance: Maximum assistance Feeding assistance: Independent Dressing Assistance: Maximum assistance     Functional Limitations Info  Sight, Hearing, Speech Sight Info: Adequate Hearing Info: Impaired Speech Info: Adequate    SPECIAL CARE FACTORS FREQUENCY  PT (By licensed PT), OT (By licensed OT), Speech therapy     PT Frequency: Evaluated 6/14 and a minimum of 3X per week therapy recommended. OT Frequency: Evaluated 5/14 and a minimum of 2X per week therapy recommended     Speech Therapy Frequency: Evaluated 6/13      Contractures Contractures Info: Not present    Additional Factors Info  Code Status, Allergies, Insulin Sliding Scale Code Status Info: DNR Allergies Info: Penicllins   Insulin Sliding Scale Info: Insulin 0-5 Units at bedtime. Insulin 0-15 Units 3X per day with meals.       Current Medications (01/30/2016):  This is the current hospital active medication list Current Facility-Administered Medications  Medication Dose Route Frequency Provider Last Rate Last Dose  . acetaminophen (TYLENOL) solution 650 mg  650 mg Per Tube Q4H PRN Oretha Milch, MD   650 mg at 01/25/16 1627  . antiseptic oral rinse (CPC / CETYLPYRIDINIUM CHLORIDE 0.05%) solution 7 mL  7 mL Mouth Rinse BID Lupita Leash, MD   7 mL at 01/30/16 1000  . citalopram (CELEXA) tablet 20 mg  20 mg Oral Daily Nishant Dhungel, MD   20 mg at 01/30/16 1029  . dextrose 5 % solution   Intravenous Continuous Nishant Dhungel,  MD 75 mL/hr at 01/30/16 1630    . dipyridamole-aspirin (AGGRENOX) 200-25 MG per 12 hr capsule 1 capsule  1 capsule Oral BID Nishant Dhungel, MD   1 capsule at 01/30/16 1029  . feeding supplement (GLUCERNA SHAKE) (GLUCERNA SHAKE) liquid 237 mL  237 mL Oral TID WC Nishant Dhungel, MD   237 mL at 01/30/16 1200  . free water 300 mL  300 mL Oral Q6H Nishant Dhungel, MD   300 mL at 01/30/16 1500  . heparin  injection 5,000 Units  5,000 Units Subcutaneous Q8H [name], NP   5,000 Units at 01/30/16 1401  . insulin aspart (novoLOG) injection 0-15 Units  0-15 Units Subcutaneous TID WC Nishant Dhungel, MD   2 Units at 01/30/16 1256  . insulin aspart (novoLOG) injection 0-5 Units  0-5 Units Subcutaneous QHS Nishant Dhungel, MD   0 Units at 01/29/16 2200  . insulin aspart (novoLOG) injection 5 Units  5 Units Subcutaneous TID WC Nishant Dhungel, MD   5 Units at 01/30/16 1255  . insulin glargine (LANTUS) injection 30 Units  30 Units Subcutaneous Daily Nishant Dhungel, MD   30 Units at 01/30/16 1028  . ipratropium-albuterol (DUONEB) 0.5-2.5 (3) MG/3ML nebulizer solution 3 mL  3 mL Nebulization TID Leslye Peerobert S Byrum, MD   3 mL at 01/30/16 1429  . ipratropium-albuterol (DUONEB) 0.5-2.5 (3) MG/3ML nebulizer solution 3 mL  3 mL Nebulization Q4H PRN Leslye Peerobert S Byrum, MD      . isosorbide mononitrate (IMDUR) 24 hr tablet 30 mg  30 mg Oral Daily Nishant Dhungel, MD   30 mg at 01/30/16 1029  . memantine (NAMENDA XR) 24 hr capsule 28 mg  28 mg Oral Q supper Nishant Dhungel, MD   28 mg at 01/29/16 2048  . metoprolol tartrate (LOPRESSOR) tablet 12.5 mg  12.5 mg Oral BID Nishant Dhungel, MD   12.5 mg at 01/30/16 1029  . niacin (SLO-NIACIN) CR tablet 500 mg  500 mg Oral QHS Nishant Dhungel, MD   500 mg at 01/29/16 2107  . pantoprazole (PROTONIX) EC tablet 40 mg  40 mg Oral Daily Nishant Dhungel, MD   40 mg at 01/30/16 1029  . pravastatin (PRAVACHOL) tablet 40 mg  40 mg Oral QHS Nishant Dhungel, MD   40 mg at 01/29/16 2107  . QUEtiapine (SEROQUEL) tablet 25 mg  25 mg Oral Q1200 Nishant Dhungel, MD   25 mg at 01/30/16 1256  . Rivastigmine PT24 13.3 mg  13.3 mg Transdermal Daily Nishant Dhungel, MD   13.3 mg at 01/29/16 1612  . tamsulosin (FLOMAX) capsule 0.4 mg  0.4 mg Oral QHS Nishant Dhungel, MD   0.4 mg at 01/29/16 2106     Discharge Medications: Please see discharge summary for a list of discharge  medications.  Relevant Imaging Results:  Relevant Lab Results:   Additional Information s#[id number]  Cristobal GoldmannCrawford, Timiya Howells Bradley, LCSW

## 2016-01-30 NOTE — Clinical Social Work Note (Signed)
Clinical Social Work Assessment  Patient Details  Name: Jared [name] MRN: [id number] Date of Birth: 06-Mar-1941  Date of referral:  01/29/16               Reason for consult:  Facility Placement                Permission sought to share information with:  Family Supports Permission granted to share information::  No (Patient oriented to self only, wife was at the bedside)  Name::     Jared FordyceLydia [name]  Agency::     Relationship::  Spouse  Contact Information:  [phone]  Housing/Transportation Living arrangements for the past 2 months:  Single Family Home Source of Information:  Spouse Patient Interpreter Needed:  None Criminal Activity/Legal Involvement Pertinent to Current Situation/Hospitalization:  No - Comment as needed Significant Relationships:  Spouse, Other Family Members Lives with:  Spouse Do you feel safe going back to the place where you live?  No (Wife reported that patient will need rehab at d/c before returning home) Need for family participation in patient care:  Yes (Comment)  Care giving concerns:  Wife in agreement and voice that patient would need rehab before returning home.   Social Worker assessment / plan:  CSW talked with patient's wife, Jared CourseLydia at bedside regarding recommendation of ST rehab. Patient acknowledged CSW after entering room, however appeared to be asleep throughout the conversation. Jared [name] reported that patient has been to [facility] before and this is where she would like patient to be discharged to.   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  Patient/Family's Understanding of and Emotional Response to Diagnosis, Current Treatment, and  Prognosis:  Not discussed.  Emotional Assessment Appearance:  Appears stated age Attitude/Demeanor/Rapport:  Other (Patient awakened briefly when CSW entered room and spoke, however appeared asleep through CSW's visit.) Affect (typically observed):  Quiet Orientation:  Oriented to Self Alcohol / Substance use:  Other (No record of tobacco, alcohol and drug use) Psych involvement (Current and /or in the community):  No (Comment)  Discharge Needs  Concerns to be addressed:  Discharge Planning Concerns Readmission within the last 30 days:  No Current discharge risk:  None Barriers to Discharge:  No Barriers Identified   Jared [name], Jared Champine Bradley, LCSW 01/30/2016, 5:29 PM

## 2016-01-30 NOTE — Progress Notes (Signed)
PROGRESS NOTE                                                                                                                                                                                                             Patient Demographics:    Jared [name], is a 75 y.o. male, DOB - 08/08/1941, ZOX:[id number]  Admit date - 01/24/2016   Admitting Physician [name], MD  Outpatient Primary MD for the patient is [name],GREG, MD  LOS - 6  Outpatient Specialists:not on file  No chief complaint on file.      Brief Narrative   75 year old male with history of CVA with residual left-sided weakness, dementia, diabetes mellitus admitted [facility] on [facility]/7 after a fall at home with left femoral neck fracture which was surgically repaired. Postoperatively he was unable to be weaned from the vent and had progressive renal failure with volume overload and [facility] on 6/10 for ongoing care and renal evaluation. Cultures were negative. Received antibiotic for possible healthcare associated pneumonia and transferred to hospitalist service once extubated on 6/12.   Subjective:   Has been drinking plenty of water as instructed. Sodium still low despite adding D5.   Assessment  & Plan :    Acute hypoxic respiratory failure Secondary to acute pulmonary edema and possible healthcare associated pneumonia. Take antibiotics today. Now maintaining O2 sat on 2 L. Incentive spirometry. DuoNeb's when necessary. Symptoms much improved.  Acute on chronic diastolic CHF Received diuretic in the ICU. Now  hypovolemic. Continue metoprolol.hold off  ACE inhibitor given his acute kidney injury.  Acute kidney injury Etiology not clear. Has hypernatremia and mild hyperkalemia. During ACE inhibitor. . Has significant urine output. The function slowly improving.  Hypernatremia Likely dehydration with overdiuresis. Has good  urine output however still hypernatremic. Urine sodium and OSM reviewed.. Avoid further  diuretics. Discussed with nephrology. Added D5 on 6/15 to support hydration. Will ask to evaluate again tomorrow if sodium does not improve.   Diabetes mellitus with hyperglycemia Lantus increased to 30 units daily with meal coverage. CBG Improved. Hold metformin.  Protein calorie malnutrition added supplements  Left Femoral neck fracture Surgically repaired on 6/7. Has not required pain medication. Lovenox for DVT prophylaxis. As per ordered from note from [name] recommend weightbearing with lateral and anterior hip precautions. PT and  recommend skilled nursing facility.  CVA with right hemiparesis Continue Aggrenox and statin.  ? Vascular dementia Resume namenda and excelon patch  ? Depression  resume seroquel    Code Status :DO NOT RESUSCITATE  Family Communication  : None at bedside. Called wife and left a message on 6/15.  Disposition Plan  : SNF for PT. will be discharged once hypernatremia improves.  Barriers For Discharge : Hypernatremia  Consults  :  PC CM  Procedures  :  Intubation 2-D echo  DVT Prophylaxis  :  Subcutaneous lovenox  Lab Results  Component Value Date   PLT 205 01/28/2016    Antibiotics  :   Anti-infectives    Start     Dose/Rate Route Frequency Ordered Stop   01/27/16 1800  imipenem-cilastatin (PRIMAXIN) 500 mg in sodium chloride 0.9 % 100 mL IVPB     500 mg 200 mL/hr over 30 Minutes Intravenous Every 8 hours 01/27/16 1206 02/01/16 1359   01/26/16 1200  imipenem-cilastatin (PRIMAXIN) 250 mg in sodium chloride 0.9 % 100 mL IVPB  Status:  Discontinued     250 mg 200 mL/hr over 30 Minutes Intravenous Every 6 hours 01/26/16 1007 01/27/16 1206   01/25/16 0500  imipenem-cilastatin (PRIMAXIN) 250 mg in sodium chloride 0.9 % 100 mL IVPB  Status:  Discontinued     250 mg 200 mL/hr over 30 Minutes Intravenous Every 12 hours 01/24/16 1817 01/26/16 1007    01/24/16 1700  vancomycin (VANCOCIN) 2,000 mg in sodium chloride 0.9 % 500 mL IVPB     2,000 mg 250 mL/hr over 120 Minutes Intravenous  Once 01/24/16 1628 01/24/16 2038   01/24/16 1700  imipenem-cilastatin (PRIMAXIN) 500 mg in sodium chloride 0.9 % 100 mL IVPB     500 mg 200 mL/hr over 30 Minutes Intravenous  Once 01/24/16 1628 01/24/16 1821        Objective:   Filed Vitals:   01/29/16 2036 01/30/16 0506 01/30/16 0802 01/30/16 0834  BP: 131/65 142/67 143/67   Pulse: 90 72 66   Temp: 98.1 F (36.7 C) 98.7 F (37.1 C) 99.6 F (37.6 C)   TempSrc: Oral Oral Oral   Resp: 20 18 19    Height:      Weight: 99.156 kg (218 lb 9.6 oz)     SpO2: 92% 90% 98% 100%    Wt Readings from Last 3 Encounters:  01/29/16 99.156 kg (218 lb 9.6 oz)     Intake/Output Summary (Last 24 hours) at 01/30/16 1407 Last data filed at 01/30/16 1335  Gross per 24 hour  Intake   2245 ml  Output    751 ml  Net   1494 ml     Physical Exam  Gen: not in distress, Some confusion (bit orientation today) HEENT:  dry mucosa, supple neck Chest: clear b/l, no added sounds CVS: N S1&S2, 3/6 systolic murmur, no rubs or gallop GI: soft, NT, ND, BS+ Musculoskeletal: warm, no edema, dressing over left hip. CNS: AAOX-2 , nonfocal    Data Review:    CBC  Recent Labs Lab 01/24/16 1633 01/25/16 0603 01/27/16 0215 01/28/16 0515  WBC 11.1* 10.8* 10.3 9.6  HGB 9.5* 9.6* 9.5* 9.8*  HCT 31.7* 31.8* 30.9* 32.8*  PLT 149* 150 166 205  MCV 99.4 97.5 95.4 98.2  MCH 29.8 29.4 29.3 29.3  MCHC 30.0 30.2 30.7 29.9*  RDW 16.1* 16.2* 16.4* 16.7*  LYMPHSABS  --   --  0.6* 1.1  MONOABS  --   --  0.8 0.7  EOSABS  --   --  0.0 0.1  BASOSABS  --   --  0.0 0.0    Chemistries   Recent Labs Lab 01/24/16 1633 01/25/16 0603 01/25/16 1655 01/26/16 0549 01/27/16 0215 01/28/16 0515 01/28/16 1810 01/29/16 0541 01/29/16 1836 01/30/16 0614  NA 143 143  --  151* 156* 161* 158* 160* 159* 159*  K 5.2* 5.4*  --  3.8  3.9 3.9 3.6 4.1 3.4* 3.7  CL 116* 116*  --  122* 129* >130* 129* 127* 128* 129*  CO2 20* 17*  --  21* 21* 23 24 24 24 24   GLUCOSE 126* 292*  --  273* 250* 295* 262* 345* 189* 239*  BUN 61* 68*  --  62* 47* 42* 40* 40* 40* 40*  CREATININE 4.08* 3.89*  --  2.57* 1.98* 1.80* 1.82* 1.76* 1.84* 1.91*  CALCIUM 8.1* 8.5*  --  8.8* 8.9 8.8* 8.7* 8.3* 8.3* 8.2*  MG 1.9 1.9 1.9 1.9 1.8  --   --   --   --   --   AST 12*  --   --   --   --   --   --   --   --   --   ALT 11*  --   --   --   --   --   --   --   --   --   ALKPHOS 46  --   --   --   --   --   --   --   --   --   BILITOT 0.6  --   --   --   --   --   --   --   --   --    ------------------------------------------------------------------------------------------------------------------ No results for input(s): CHOL, HDL, LDLCALC, TRIG, CHOLHDL, LDLDIRECT in the last 72 hours.  No results found for: HGBA1C ------------------------------------------------------------------------------------------------------------------ No results for input(s): TSH, T4TOTAL, T3FREE, THYROIDAB in the last 72 hours.  Invalid input(s): FREET3 ------------------------------------------------------------------------------------------------------------------ No results for input(s): VITAMINB12, FOLATE, FERRITIN, TIBC, IRON, RETICCTPCT in the last 72 hours.  Coagulation profile No results for input(s): INR, PROTIME in the last 168 hours.  No results for input(s): DDIMER in the last 72 hours.  Cardiac Enzymes No results for input(s): CKMB, TROPONINI, MYOGLOBIN in the last 168 hours.  Invalid input(s): CK ------------------------------------------------------------------------------------------------------------------    Component Value Date/Time   BNP 243.6* 01/24/2016 1633    Inpatient Medications  Scheduled Meds: . antiseptic oral rinse  7 mL Mouth Rinse BID  . citalopram  20 mg Oral Daily  . dipyridamole-aspirin  1 capsule Oral BID  . feeding  supplement (GLUCERNA SHAKE)  237 mL Oral TID WC  . free water  300 mL Oral Q6H  . heparin  5,000 Units Subcutaneous Q8H  . imipenem-cilastatin  500 mg Intravenous Q8H  . insulin aspart  0-15 Units Subcutaneous TID WC  . insulin aspart  0-5 Units Subcutaneous QHS  . insulin aspart  5 Units Subcutaneous TID WC  . insulin glargine  30 Units Subcutaneous Daily  . ipratropium-albuterol  3 mL Nebulization TID  . isosorbide mononitrate  30 mg Oral Daily  . memantine  28 mg Oral Q supper  . metoprolol tartrate  12.5 mg Oral BID  . niacin  500 mg Oral QHS  . pantoprazole  40 mg Oral Daily  . pravastatin  40 mg Oral QHS  . QUEtiapine  25 mg Oral Q1200  .  Rivastigmine  13.3 mg Transdermal Daily  . tamsulosin  0.4 mg Oral QHS   Continuous Infusions: . dextrose 75 mL/hr at 01/29/16 1825   PRN Meds:.acetaminophen (TYLENOL) oral liquid 160 mg/5 mL, ipratropium-albuterol  Micro Results Recent Results (from the past 240 hour(s))  Culture, blood (routine x 2)     Status: None   Collection Time: 01/24/16  4:33 PM  Result Value Ref Range Status   Specimen Description BLOOD BLOOD LEFT HAND  Final   Special Requests IN PEDIATRIC BOTTLE 3CC  Final   Culture NO GROWTH 5 DAYS  Final   Report Status 01/29/2016 FINAL  Final  Culture, blood (routine x 2)     Status: None   Collection Time: 01/24/16  4:41 PM  Result Value Ref Range Status   Specimen Description BLOOD BLOOD RIGHT HAND  Final   Special Requests BOTTLES DRAWN AEROBIC AND ANAEROBIC 5CC  Final   Culture NO GROWTH 5 DAYS  Final   Report Status 01/29/2016 FINAL  Final  MRSA PCR Screening     Status: None   Collection Time: 01/25/16  9:23 AM  Result Value Ref Range Status   MRSA by PCR NEGATIVE NEGATIVE Final    Comment:        The GeneXpert MRSA Assay (FDA approved for NASAL specimens only), is one component of a comprehensive MRSA colonization surveillance program. It is not intended to diagnose MRSA infection nor to guide  or monitor treatment for MRSA infections.   Culture, Urine     Status: None   Collection Time: 01/25/16  4:24 PM  Result Value Ref Range Status   Specimen Description URINE, CATHETERIZED  Final   Special Requests NONE  Final   Culture NO GROWTH  Final   Report Status 01/26/2016 FINAL  Final    Radiology Reports Dg Chest Port 1 View  01/26/2016  CLINICAL DATA:  Respiratory failure. EXAM: PORTABLE CHEST 1 VIEW COMPARISON:  01/25/2016. FINDINGS: Endotracheal and NG tube in stable position. Cardiomegaly with pulmonary vascular congestion and interstitial prominence along the left side pleural effusion no suggesting congestive heart failure. Persistent infiltrate next right mid lung again noted. Continued follow-up exams again suggested to demonstrate clearing. No pneumothorax. IMPRESSION: 1. Lines and tubes in stable position. 2. Persistent infiltrate right mid lung again noted. Continued follow-up chest x-rays to demonstrate clearing suggested. 3. Cardiomegaly with new onset pulmonary venous congestion and bilateral interstitial prominence along with left-sided pleural effusion. Findings consistent with congestive heart failure. Electronically Signed   By: Maisie Fus  Register   On: 01/26/2016 06:52   Dg Chest Port 1 View  01/25/2016  CLINICAL DATA:  Fever. EXAM: PORTABLE CHEST 1 VIEW COMPARISON:  January 24, 2016 FINDINGS: Stable support apparatus. No pneumothorax. The focal opacity in the medial right lung base has resolved. Minimal opacity in the bases may simply represent atelectasis. No other changes. IMPRESSION: Resolution of the focal opacity in the medial right lung base. Minimal residual opacity in the bases, likely atelectasis. Electronically Signed   By: Gerome Sam III M.D   On: 01/25/2016 08:31   Dg Chest Port 1 View  01/24/2016  CLINICAL DATA:  Respiratory failure. EXAM: PORTABLE CHEST 1 VIEW COMPARISON:  None. FINDINGS: Endotracheal tube in satisfactory position. Nasogastric tube  extending into the stomach. Mildly enlarged cardiac silhouette. Patchy opacity in the right mid to lower lung zone, medially. There is also airspace opacity in the left lower lobe. Possible small left pleural effusion. Diffuse osteopenia. IMPRESSION: 1. Probable pneumonia  in the right mid to lower lung zone, medially. A mass cannot be excluded and this will require follow-up. 2. Left lower lobe atelectasis or pneumonia. 3. Possible small left pleural effusion. 4. Mild cardiomegaly. Electronically Signed   By: Beckie Salts M.D.   On: 01/24/2016 17:08    Time Spent in minutes  25   Eddie North M.D on 01/30/2016 at 2:07 PM  Between 7am to 7pm - Pager - [phone]  After 7pm go to [email or web address] - password [facility]  [facility] -  Office  [phone]

## 2016-01-31 ENCOUNTER — Inpatient Hospital Stay (HOSPITAL_COMMUNITY): Payer: Medicare Other

## 2016-01-31 DIAGNOSIS — R509 Fever, unspecified: Secondary | ICD-10-CM

## 2016-01-31 LAB — URINE MICROSCOPIC-ADD ON

## 2016-01-31 LAB — URINALYSIS, ROUTINE W REFLEX MICROSCOPIC
Bilirubin Urine: NEGATIVE
GLUCOSE, UA: 250 mg/dL — AB
Ketones, ur: NEGATIVE mg/dL
Leukocytes, UA: NEGATIVE
Nitrite: NEGATIVE
Protein, ur: 100 mg/dL — AB
SPECIFIC GRAVITY, URINE: 1.018 (ref 1.005–1.030)
pH: 5.5 (ref 5.0–8.0)

## 2016-01-31 LAB — BASIC METABOLIC PANEL
ANION GAP: 7 (ref 5–15)
ANION GAP: 8 (ref 5–15)
BUN: 33 mg/dL — ABNORMAL HIGH (ref 6–20)
BUN: 29 mg/dL — AB (ref 6–20)
CALCIUM: 7.7 mg/dL — AB (ref 8.9–10.3)
CO2: 23 mmol/L (ref 22–32)
CO2: 21 mmol/L — ABNORMAL LOW (ref 22–32)
CREATININE: 2.07 mg/dL — AB (ref 0.61–1.24)
Calcium: 7.7 mg/dL — ABNORMAL LOW (ref 8.9–10.3)
Chloride: 125 mmol/L — ABNORMAL HIGH (ref 101–111)
Chloride: 125 mmol/L — ABNORMAL HIGH (ref 101–111)
Creatinine, Ser: 1.9 mg/dL — ABNORMAL HIGH (ref 0.61–1.24)
GFR calc non Af Amer: 30 mL/min — ABNORMAL LOW (ref >60)
GFR, EST AFRICAN AMERICAN: 39 mL/min — AB (ref >60)
GFR, EST AFRICAN AMERICAN: 35 mL/min — AB (ref >60)
GFR, EST NON AFRICAN AMERICAN: 34 mL/min — AB (ref >60)
GLUCOSE: 270 mg/dL — AB (ref 65–99)
GLUCOSE: 159 mg/dL — AB (ref 65–99)
POTASSIUM: 3.9 mmol/L (ref 3.5–5.1)
POTASSIUM: 3.5 mmol/L (ref 3.5–5.1)
SODIUM: 155 mmol/L — AB (ref 135–145)
SODIUM: 154 mmol/L — AB (ref 135–145)

## 2016-01-31 LAB — GLUCOSE, CAPILLARY
GLUCOSE-CAPILLARY: 167 mg/dL — AB (ref 65–99)
Glucose-Capillary: 310 mg/dL — ABNORMAL HIGH (ref 65–99)
Glucose-Capillary: 152 mg/dL — ABNORMAL HIGH (ref 65–99)

## 2016-01-31 MED ORDER — VANCOMYCIN HCL 10 G IV SOLR
1750.0000 mg | Freq: Once | INTRAVENOUS | Status: AC
  Administered 2016-01-31: 1750 mg via INTRAVENOUS
  Filled 2016-01-31: qty 1750

## 2016-01-31 MED ORDER — DEXTROSE 5 % IV SOLN
2.0000 g | Freq: Three times a day (TID) | INTRAVENOUS | Status: DC
  Administered 2016-01-31 – 2016-02-02 (×5): 2 g via INTRAVENOUS
  Filled 2016-01-31 (×8): qty 2

## 2016-01-31 MED ORDER — INSULIN GLARGINE 100 UNIT/ML ~~LOC~~ SOLN
40.0000 [IU] | Freq: Every day | SUBCUTANEOUS | Status: DC
  Administered 2016-01-31 – 2016-02-01 (×2): 40 [IU] via SUBCUTANEOUS
  Filled 2016-01-31 (×3): qty 0.4

## 2016-01-31 MED ORDER — INSULIN ASPART 100 UNIT/ML ~~LOC~~ SOLN
7.0000 [IU] | Freq: Three times a day (TID) | SUBCUTANEOUS | Status: DC
  Administered 2016-01-31 – 2016-02-01 (×3): 7 [IU] via SUBCUTANEOUS

## 2016-01-31 MED ORDER — VANCOMYCIN HCL 10 G IV SOLR
1250.0000 mg | INTRAVENOUS | Status: DC
  Administered 2016-02-01: 1250 mg via INTRAVENOUS
  Filled 2016-01-31 (×2): qty 1250

## 2016-01-31 NOTE — Progress Notes (Signed)
SW spoke with patient's wife via phone this morning. Reported that there is a current bed offer at [facility]. Awaiting offers [place]. Wife indicated that per MD- d/c anticipated first to middle of next week.  She states she would prefer either [facility], [facility] or [facility]  if possible.  Will ask weekday CSW to follow up on this.  Lorri Frederickonna T. Jaci LazierCrowder, LCSW [phone] (weekend coverage)

## 2016-01-31 NOTE — Progress Notes (Signed)
Pt with temp of 102.5. [facility]. Will continue to monitor the patient closely.   Feliciana RossettiLaura Kynan Peasley, RN, BSN

## 2016-01-31 NOTE — Progress Notes (Signed)
PROGRESS NOTE                                                                                                                                                                                                             Patient Demographics:    Jared [name], is a 75 y.o. male, DOB - 1940-12-27, MVH:[id number]  Admit date - 01/24/2016   Admitting Physician Lupita Leash, MD  Outpatient Primary MD for the patient is Feliciana Rossetti, MD  LOS - 7  Outpatient Specialists:not on file  No chief complaint on file.      Brief Narrative   75 year old male with history of CVA with residual left-sided weakness, dementia, diabetes mellitus admitted to [facility] on 6/7 after a fall at home with left femoral neck fracture which was surgically repaired. Postoperatively he was unable to be weaned from the vent and had progressive renal failure with volume overload and transferred to [facility] on 6/10 for ongoing care and renal evaluation. Cultures were negative. Received antibiotic for possible healthcare associated pneumonia and transferred to hospitalist service once extubated on 6/12.   Subjective:   Ordering slowly improved. Febrile to 10 66F today with dropping O2 sat to high 80s.   Assessment  & Plan :    Acute hypoxic respiratory failure Secondary to acute pulmonary edema and possible healthcare associated pneumonia. Completed 7 days of antibiotics on 6/16. Desatted to 80s this morning with chest x-[place] showing stable mild congestive heart failure with patchy bibasilar opacity, pneumonia versus atelectasis. Continue O2 via and Bedside spirometry.  Fever on 6/17 MAXIMUM TEMPERATURE 1066F. UA negative for UTI. Blood cultures sent. Chest x-[place] favored atelectasis but cannot rule out possible pneumonia. Since he completed 7 day course of antibiotic only yesterday I will hold off on resuming antibiotic at this time unless he  continues to have temperature spikes." When necessary for  Acute on chronic diastolic CHF Received diuretic in the ICU. Now  hypovolemic. Continue metoprolol.hold off  ACE inhibitor given his acute kidney injury.  Acute kidney injury Etiology not clear. Has hypernatremia and mild hyperkalemia. Holding ACE inhibitor. . Has significant urine output. Renal function slowly improving.  Hypernatremia Likely dehydration with overdiuresis. . Urine sodium and OSM reviewed.. Avoid further  diuretics. Discussed with nephrology. Added D5 on 6/15 to support hydration. Slowly improving.  Diabetes mellitus with  hyperglycemia Lantus increased to 40 units daily with 7 unit aspart 3 times a day with meals. Hold metformin.  Protein calorie malnutrition added supplements  Left Femoral neck fracture Surgically repaired on 6/7. Has not required pain medication. Lovenox for DVT prophylaxis. As per ordered from note from [place] recommend weightbearing with lateral and anterior hip precautions. PT and recommend skilled nursing facility.  CVA with right hemiparesis Continue Aggrenox and statin.  ? Vascular dementia Resume namenda and excelon patch  ? Depression  resume seroquel    Code Status :DO NOT RESUSCITATE  Family Communication  : Scars to his wife and daughter at bedside on 6/16.  Disposition Plan  : SNF for PT. will be discharged once hypernatremia improves and no further fever.  Barriers For Discharge : Hypernatremia  Consults  :  PC CM  Procedures  :  Intubation 2-D echo  DVT Prophylaxis  :  Subcutaneous lovenox  Lab Results  Component Value Date   PLT 205 01/28/2016    Antibiotics  :   Anti-infectives    Start     Dose/Rate Route Frequency Ordered Stop   01/27/16 1800  imipenem-cilastatin (PRIMAXIN) 500 mg in sodium chloride 0.9 % 100 mL IVPB  Status:  Discontinued     500 mg 200 mL/hr over 30 Minutes Intravenous Every 8 hours 01/27/16 1206 01/30/16 1616   01/26/16 1200   imipenem-cilastatin (PRIMAXIN) 250 mg in sodium chloride 0.9 % 100 mL IVPB  Status:  Discontinued     250 mg 200 mL/hr over 30 Minutes Intravenous Every 6 hours 01/26/16 1007 01/27/16 1206   01/25/16 0500  imipenem-cilastatin (PRIMAXIN) 250 mg in sodium chloride 0.9 % 100 mL IVPB  Status:  Discontinued     250 mg 200 mL/hr over 30 Minutes Intravenous Every 12 hours 01/24/16 1817 01/26/16 1007   01/24/16 1700  vancomycin (VANCOCIN) 2,000 mg in sodium chloride 0.9 % 500 mL IVPB     2,000 mg 250 mL/hr over 120 Minutes Intravenous  Once 01/24/16 1628 01/24/16 2038   01/24/16 1700  imipenem-cilastatin (PRIMAXIN) 500 mg in sodium chloride 0.9 % 100 mL IVPB     500 mg 200 mL/hr over 30 Minutes Intravenous  Once 01/24/16 1628 01/24/16 1821        Objective:   Filed Vitals:   01/31/16 0900 01/31/16 0938 01/31/16 1003 01/31/16 1519  BP: 139/64     Pulse: 86     Temp:   102 F (38.9 C)   TempSrc:      Resp: 22     Height:      Weight:      SpO2: 96% 92%  92%    Wt Readings from Last 3 Encounters:  01/30/16 100.29 kg (221 lb 1.6 oz)     Intake/Output Summary (Last 24 hours) at 01/31/16 1537 Last data filed at 01/31/16 1301  Gross per 24 hour  Intake 1663.75 ml  Output   1900 ml  Net -236.25 ml     Physical Exam  Gen: not in distress, Better oriented HEENT:  dry mucosa, supple neck Chest: Diminished bilateral breath sounds CVS: N S1&S2, 3/6 systolic murmur, no rubs or gallop GI: soft, NT, ND, BS+ Musculoskeletal: warm, no edema, dressing over left hip. CNS: AAOX-2 , nonfocal    Data Review:    CBC  Recent Labs Lab 01/24/16 1633 01/25/16 0603 01/27/16 0215 01/28/16 0515  WBC 11.1* 10.8* 10.3 9.6  HGB 9.5* 9.6* 9.5* 9.8*  HCT 31.7* 31.8* 30.9* 32.8*  PLT 149* 150 166 205  MCV 99.4 97.5 95.4 98.2  MCH 29.8 29.4 29.3 29.3  MCHC 30.0 30.2 30.7 29.9*  RDW 16.1* 16.2* 16.4* 16.7*  LYMPHSABS  --   --  0.6* 1.1  MONOABS  --   --  0.8 0.7  EOSABS  --   --  0.0  0.1  BASOSABS  --   --  0.0 0.0    Chemistries   Recent Labs Lab 01/24/16 1633 01/25/16 0603 01/25/16 1655 01/26/16 0549 01/27/16 0215  01/29/16 0541 01/29/16 1836 01/30/16 0614 01/30/16 1819 01/31/16 0450  NA 143 143  --  151* 156*  < > 160* 159* 159* 155* 155*  K 5.2* 5.4*  --  3.8 3.9  < > 4.1 3.4* 3.7 3.5 3.9  CL 116* 116*  --  122* 129*  < > 127* 128* 129* 127* 125*  CO2 20* 17*  --  21* 21*  < > 24 24 24 23 23   GLUCOSE 126* 292*  --  273* 250*  < > 345* 189* 239* 266* 270*  BUN 61* 68*  --  62* 47*  < > 40* 40* 40* 39* 33*  CREATININE 4.08* 3.89*  --  2.57* 1.98*  < > 1.76* 1.84* 1.91* 2.07* 1.90*  CALCIUM 8.1* 8.5*  --  8.8* 8.9  < > 8.3* 8.3* 8.2* 8.0* 7.7*  MG 1.9 1.9 1.9 1.9 1.8  --   --   --   --   --   --   AST 12*  --   --   --   --   --   --   --   --   --   --   ALT 11*  --   --   --   --   --   --   --   --   --   --   ALKPHOS 46  --   --   --   --   --   --   --   --   --   --   BILITOT 0.6  --   --   --   --   --   --   --   --   --   --   < > = values in this interval not displayed. ------------------------------------------------------------------------------------------------------------------ No results for input(s): CHOL, HDL, LDLCALC, TRIG, CHOLHDL, LDLDIRECT in the last 72 hours.  No results found for: HGBA1C ------------------------------------------------------------------------------------------------------------------ No results for input(s): TSH, T4TOTAL, T3FREE, THYROIDAB in the last 72 hours.  Invalid input(s): FREET3 ------------------------------------------------------------------------------------------------------------------ No results for input(s): VITAMINB12, FOLATE, FERRITIN, TIBC, IRON, RETICCTPCT in the last 72 hours.  Coagulation profile No results for input(s): INR, PROTIME in the last 168 hours.  No results for input(s): DDIMER in the last 72 hours.  Cardiac Enzymes No results for input(s): CKMB, TROPONINI, MYOGLOBIN in  the last 168 hours.  Invalid input(s): CK ------------------------------------------------------------------------------------------------------------------    Component Value Date/Time   BNP 243.6* 01/24/2016 1633    Inpatient Medications  Scheduled Meds: . antiseptic oral rinse  7 mL Mouth Rinse BID  . citalopram  20 mg Oral Daily  . dipyridamole-aspirin  1 capsule Oral BID  . feeding supplement (GLUCERNA SHAKE)  237 mL Oral TID WC  . free water  300 mL Oral Q6H  . heparin  5,000 Units Subcutaneous Q8H  . insulin aspart  0-15 Units Subcutaneous TID WC  . insulin aspart  0-5 Units Subcutaneous QHS  .  insulin aspart  7 Units Subcutaneous TID WC  . insulin glargine  40 Units Subcutaneous Daily  . ipratropium-albuterol  3 mL Nebulization TID  . isosorbide mononitrate  30 mg Oral Daily  . memantine  28 mg Oral Q supper  . metoprolol tartrate  12.5 mg Oral BID  . niacin  500 mg Oral QHS  . pantoprazole  40 mg Oral Daily  . pravastatin  40 mg Oral QHS  . QUEtiapine  25 mg Oral Q1200  . Rivastigmine  13.3 mg Transdermal Daily  . tamsulosin  0.4 mg Oral QHS   Continuous Infusions: . dextrose 75 mL/hr at 01/31/16 0852   PRN Meds:.acetaminophen (TYLENOL) oral liquid 160 mg/5 mL, ipratropium-albuterol  Micro Results Recent Results (from the past 240 hour(s))  Culture, blood (routine x 2)     Status: None   Collection Time: 01/24/16  4:33 PM  Result Value Ref Range Status   Specimen Description BLOOD BLOOD LEFT HAND  Final   Special Requests IN PEDIATRIC BOTTLE 3CC  Final   Culture NO GROWTH 5 DAYS  Final   Report Status 01/29/2016 FINAL  Final  Culture, blood (routine x 2)     Status: None   Collection Time: 01/24/16  4:41 PM  Result Value Ref Range Status   Specimen Description BLOOD BLOOD RIGHT HAND  Final   Special Requests BOTTLES DRAWN AEROBIC AND ANAEROBIC 5CC  Final   Culture NO GROWTH 5 DAYS  Final   Report Status 01/29/2016 FINAL  Final  MRSA PCR Screening      Status: None   Collection Time: 01/25/16  9:23 AM  Result Value Ref Range Status   MRSA by PCR NEGATIVE NEGATIVE Final    Comment:        The GeneXpert MRSA Assay (FDA approved for NASAL specimens only), is one component of a comprehensive MRSA colonization surveillance program. It is not intended to diagnose MRSA infection nor to guide or monitor treatment for MRSA infections.   Culture, Urine     Status: None   Collection Time: 01/25/16  4:24 PM  Result Value Ref Range Status   Specimen Description URINE, CATHETERIZED  Final   Special Requests NONE  Final   Culture NO GROWTH  Final   Report Status 01/26/2016 FINAL  Final    Radiology Reports Dg Chest Port 1 View  01/31/2016  CLINICAL DATA:  Fever.  Shortness of breath. EXAM: PORTABLE CHEST 1 VIEW COMPARISON:  01/26/2016 chest radiograph. FINDINGS: Stable cardiomediastinal silhouette with mild cardiomegaly. No pneumothorax. Stable small left pleural effusion. Stable patchy opacities at the left greater than right lung bases. Stable diffuse linear parahilar interstitial opacities suggesting mild pulmonary edema. IMPRESSION: 1. Stable mild congestive heart failure and small left pleural effusion . 2. Stable patchy bibasilar lung opacities, left greater than right, favor atelectasis, cannot exclude a component of pneumonia or aspiration. Electronically Signed   By: Delbert Phenix M.D.   On: 01/31/2016 11:55   Dg Chest Port 1 View  01/26/2016  CLINICAL DATA:  Respiratory failure. EXAM: PORTABLE CHEST 1 VIEW COMPARISON:  01/25/2016. FINDINGS: Endotracheal and NG tube in stable position. Cardiomegaly with pulmonary vascular congestion and interstitial prominence along the left side pleural effusion no suggesting congestive heart failure. Persistent infiltrate next right mid lung again noted. Continued follow-up exams again suggested to demonstrate clearing. No pneumothorax. IMPRESSION: 1. Lines and tubes in stable position. 2. Persistent  infiltrate right mid lung again noted. Continued follow-up chest x-rays to demonstrate clearing  suggested. 3. Cardiomegaly with new onset pulmonary venous congestion and bilateral interstitial prominence along with left-sided pleural effusion. Findings consistent with congestive heart failure. Electronically Signed   By: Maisie Fushomas  Register   On: 01/26/2016 06:52   Dg Chest Port 1 View  01/25/2016  CLINICAL DATA:  Fever. EXAM: PORTABLE CHEST 1 VIEW COMPARISON:  January 24, 2016 FINDINGS: Stable support apparatus. No pneumothorax. The focal opacity in the medial right lung base has resolved. Minimal opacity in the bases may simply represent atelectasis. No other changes. IMPRESSION: Resolution of the focal opacity in the medial right lung base. Minimal residual opacity in the bases, likely atelectasis. Electronically Signed   By: Gerome Samavid  Williams III M.D   On: 01/25/2016 08:31   Dg Chest Port 1 View  01/24/2016  CLINICAL DATA:  Respiratory failure. EXAM: PORTABLE CHEST 1 VIEW COMPARISON:  None. FINDINGS: Endotracheal tube in satisfactory position. Nasogastric tube extending into the stomach. Mildly enlarged cardiac silhouette. Patchy opacity in the right mid to lower lung zone, medially. There is also airspace opacity in the left lower lobe. Possible small left pleural effusion. Diffuse osteopenia. IMPRESSION: 1. Probable pneumonia in the right mid to lower lung zone, medially. A mass cannot be excluded and this will require follow-up. 2. Left lower lobe atelectasis or pneumonia. 3. Possible small left pleural effusion. 4. Mild cardiomegaly. Electronically Signed   By: Beckie SaltsSteven  Reid M.D.   On: 01/24/2016 17:08    Time Spent in minutes  25   Eddie NorthHUNGEL, Elnoria Livingston M.D on 01/31/2016 at 3:37 PM  Between 7am to 7pm - Pager - [phone]  After 7pm go to [email or web address] - password [email or web address]  [facility] -  Office  [phone]

## 2016-01-31 NOTE — Progress Notes (Signed)
Pharmacy Antibiotic Note  Jared SalvageRonald Purdie is a 75 y.o. male admitted on 01/24/2016 with acute respiratory failure and AKI. The patient just recently finished Primaxin for ?UTI/PNA and with fevers this evening and continues SOB. CXR shows possible PNA and pharmacy has been consulted to start Vancomycin along with Azactam per MD  Plan: 1. Vancomycin 1750 mg IV x 1 dose to load 2. Vancomycin 1250 mg IV every 24 hours 3. Continue Azactam 2g IV every 8 hours per MD 4. Will continue to follow renal function, culture results, LOT, and antibiotic de-escalation plans    Height: 6' (182.9 cm) Weight: 221 lb 1.6 oz (100.29 kg) IBW/kg (Calculated) : 77.6  Temp (24hrs), Avg:100.3 F (37.9 C), Min:98 F (36.7 C), Max:102 F (38.9 C)   Recent Labs Lab 01/25/16 0603  01/27/16 0215 01/28/16 0515  01/29/16 1836 01/30/16 0614 01/30/16 1819 01/31/16 0450 01/31/16 1824  WBC 10.8*  --  10.3 9.6  --   --   --   --   --   --   CREATININE 3.89*  < > 1.98* 1.80*  < > 1.84* 1.91* 2.07* 1.90* 2.07*  < > = values in this interval not displayed.  Estimated Creatinine Clearance: 39.6 mL/min (by C-G formula based on Cr of 2.07).    Allergies  Allergen Reactions  . Penicillins Hives    Early childhood reaction - no other information available    Antimicrobials this admission: Vanc 6/10 x 1; restart 6/17 >> Primaxin 6/10 >> 6/16 Azactam 6/17 >>  Dose adjustments this admission: n/a  Microbiology results: 6/11 MRSA PCR >> neg 6/11 UCx >> NG 6/10 BCx >> NGf 6/17 BCx >> 6/17 UCx >> 6/17 RCx >>  Thank you for allowing pharmacy to be a part of this patient's care.  Georgina PillionElizabeth Pahoua Schreiner, PharmD, BCPS Clinical Pharmacist Pager: [phone] 01/31/2016 9:32 PM

## 2016-02-01 ENCOUNTER — Inpatient Hospital Stay (HOSPITAL_COMMUNITY): Payer: Medicare Other

## 2016-02-01 DIAGNOSIS — E1165 Type 2 diabetes mellitus with hyperglycemia: Secondary | ICD-10-CM

## 2016-02-01 LAB — GLUCOSE, CAPILLARY
GLUCOSE-CAPILLARY: 268 mg/dL — AB (ref 65–99)
GLUCOSE-CAPILLARY: 301 mg/dL — AB (ref 65–99)
GLUCOSE-CAPILLARY: 234 mg/dL — AB (ref 65–99)
GLUCOSE-CAPILLARY: 194 mg/dL — AB (ref 65–99)
GLUCOSE-CAPILLARY: 156 mg/dL — AB (ref 65–99)
Glucose-Capillary: 166 mg/dL — ABNORMAL HIGH (ref 65–99)

## 2016-02-01 LAB — BLOOD GAS, ARTERIAL
ACID-BASE DEFICIT: 2.6 mmol/L — AB (ref 0.0–2.0)
BICARBONATE: 21.9 meq/L (ref 20.0–24.0)
DRAWN BY: 105521
FIO2: 1
O2 Saturation: 87.2 %
PATIENT TEMPERATURE: 98.6
PH ART: 7.362 (ref 7.350–7.450)
TCO2: 23.2 mmol/L (ref 0–100)
pCO2 arterial: 39.6 mmHg (ref 35.0–45.0)
pO2, Arterial: 56.8 mmHg — ABNORMAL LOW (ref 80.0–100.0)

## 2016-02-01 LAB — URINE CULTURE
Culture: NO GROWTH
Special Requests: NORMAL

## 2016-02-01 LAB — BASIC METABOLIC PANEL
ANION GAP: 8 (ref 5–15)
BUN: 29 mg/dL — ABNORMAL HIGH (ref 6–20)
CO2: 23 mmol/L (ref 22–32)
Calcium: 7.5 mg/dL — ABNORMAL LOW (ref 8.9–10.3)
Chloride: 124 mmol/L — ABNORMAL HIGH (ref 101–111)
Creatinine, Ser: 2.19 mg/dL — ABNORMAL HIGH (ref 0.61–1.24)
GFR calc Af Amer: 33 mL/min — ABNORMAL LOW (ref >60)
GFR, EST NON AFRICAN AMERICAN: 28 mL/min — AB (ref >60)
GLUCOSE: 236 mg/dL — AB (ref 65–99)
POTASSIUM: 3.8 mmol/L (ref 3.5–5.1)
Sodium: 155 mmol/L — ABNORMAL HIGH (ref 135–145)

## 2016-02-01 LAB — HIV ANTIBODY (ROUTINE TESTING W REFLEX): HIV Screen 4th Generation wRfx: NONREACTIVE

## 2016-02-01 MED ORDER — FUROSEMIDE 10 MG/ML IJ SOLN
40.0000 mg | Freq: Once | INTRAMUSCULAR | Status: AC
  Administered 2016-02-01: 40 mg via INTRAVENOUS

## 2016-02-01 MED ORDER — FUROSEMIDE 10 MG/ML IJ SOLN
INTRAMUSCULAR | Status: AC
Start: 2016-02-01 — End: 2016-02-02
  Filled 2016-02-01: qty 4

## 2016-02-01 MED ORDER — ACETAMINOPHEN 650 MG RE SUPP
650.0000 mg | Freq: Four times a day (QID) | RECTAL | Status: DC | PRN
  Administered 2016-02-01 – 2016-02-06 (×3): 650 mg via RECTAL
  Filled 2016-02-01 (×4): qty 1

## 2016-02-01 MED ORDER — ACETAMINOPHEN 160 MG/5ML PO SOLN
650.0000 mg | ORAL | Status: DC | PRN
  Administered 2016-02-01 – 2016-02-09 (×9): 650 mg via ORAL
  Filled 2016-02-01 (×10): qty 20.3

## 2016-02-01 NOTE — Progress Notes (Signed)
Pt axillary temp 103. Tylenol suppository given. Ice packs applied to both groin and armpit.  Pt is in laboured breathing, RR 40, using accessory muscles, Pt was put on BiPAP STAT asper NP T.Callahans orders.  Pt is not following verbal commands. Rec'd order from NP T. Callahans to hold PO meds for tonight.

## 2016-02-01 NOTE — Progress Notes (Signed)
Received patient from [address]; Unresponsive, moaning at intervals. Nonrebreather mask in progress. O2 sat 95%. Noted temp per tech 103 but was medicated prior to arriving. Vitals obtained and CCMD notified and admitted to the unit.

## 2016-02-01 NOTE — Progress Notes (Signed)
Found patient decreasing mental status,moaning and groaning,course breath sound bilaterally ,saturation 78% on 4 lpm/[place].Immediately set to non rebreathing bag ,breathing treatment given.Paged M.D.Respiratory paged.Rapid Response made aware.Orders done and carried.Patient oxygen saturation 95% post breathing treatment,on non rebreathing mask.Patient mental status remeined the same-moaning/groaning.Rapid response nurse at the bedside.Patient's wife and son arrived and updated on patient's condition-labs worsening numbers and chest x-[place].M.D. Arrived. Awaiting stepdown/I.C..U. bed.

## 2016-02-01 NOTE — Progress Notes (Signed)
Patient more responsive at this time,opened his eyes,able to say one or two words with his family members but still confused,still on labored breathing,skin is dry and warmer.Temperature 100.4.Tylenol 650 suppository given at this time.95% on non- re breather mask at this time.Urine output 300 cc post 4 hours 40 mg Lasix I.V.

## 2016-02-01 NOTE — Progress Notes (Signed)
PROGRESS NOTE                                                                                                                                                                                                             Patient Demographics:    Jared [name], is a 75 y.o. male, DOB - 04/17/1941, ZOX:[id number]  Admit date - 01/24/2016   Admitting Physician [name], MD  Outpatient Primary MD for the patient is [name],GREG, MD  LOS - 8  Outpatient Specialists:not on file  No chief complaint on file.      Brief Narrative   75 year old male with history of CVA with residual left-sided weakness, dementia, diabetes mellitus [facility] on 6[facility]7 after a fall at home with left femoral neck fracture which was surgically repaired. Postoperatively he was unable to be weaned from the vent and had progressive renal failure with volume overload and transferred to [facility] on 6/10 for ongoing care and renal evaluation. Cultures were negative. Received antibiotic for possible healthcare associated pneumonia and transferred to hospitalist service once extubated on 6/12.   Subjective:   Patient spiked a temperature again during the night and started on vancomycin and aztreonam empirically. Cultures sent. During the morning he was hypoxic requiring 4 L via nasal cannula. During the afternoon he desaturated to the 80s requiting NRB. Chest x-[place] showed increased pulmonary congestion and increased focal consolidation of left lower lung. Given a dose of IV Lasix. ABG done showing mild hypoxemia. Transfer to stepdown unit.   Assessment  & Plan :    Acute hypoxic respiratory failure Secondary to acute pulmonary edema and possible healthcare associated pneumonia. Completed 7 days of antibiotics on 6/16. Patient again febrile since 6/17 and desaturating to the 80s, initially requiring 4 L via nasal cannula and then NRB. Chest  x-[place] showing pulmonary edema and worsened consolidation. Restarted empiric vancomycin and aztreonam. Transferred to stepdown unit.      Acute on chronic diastolic CHF Received diuretic in the ICU. Was started on gentle hydration due to hypovolemia.  Continue metoprolol.hold off  ACE inhibitor given his acute kidney injury.  Acute kidney injury Etiology not clear. Has hypernatremia and mild hyperkalemia. Holding ACE inhibitor. . Has significant urine output. Renal function slowly improving.  Hypernatremia Suspected due to dehydration with overdiuresis. Even free water with D5  with some improvement. Now getting when necessary Lasix for fluid overload. Monitor closely.  Diabetes mellitus with hyperglycemia Lantus increased to 40 units daily with 7 unit aspart 3 times a day with meals. Hold metformin.  Protein calorie malnutrition added supplements  Left Femoral neck fracture Surgically repaired on 6/7. Has not required pain medication. Lovenox for DVT prophylaxis. As per ordered from note from [facility] recommend weightbearing with lateral and anterior hip precautions. PT and recommend skilled nursing facility.  CVA with right hemiparesis Continue Aggrenox and statin.  ? Vascular dementia Resume namenda and excelon patch  ? Depression  resume seroquel    Code Status :DO NOT RESUSCITATE  Family Communication  :Wife and son at bedside  Disposition Plan  :Transfer to stepdown. Skilled nursing facility once clinically improved  Barriers For Discharge :Acute hypoxic respiratory failure, pneumonia,  Hypernatremia  Consults  :  PC CM  Procedures  :  Intubation 2-D echo  DVT Prophylaxis  :  Subcutaneous lovenox  Lab Results  Component Value Date   PLT 205 01/28/2016    Antibiotics  :   Anti-infectives    Start     Dose/Rate Route Frequency Ordered Stop   02/01/16 2300  vancomycin (VANCOCIN) 1,250 mg in sodium chloride 0.9 % 250 mL IVPB     1,250 mg 166.7 mL/hr over  90 Minutes Intravenous Every 24 hours 01/31/16 2133     01/31/16 2200  aztreonam (AZACTAM) 2 g in dextrose 5 % 50 mL IVPB     2 g 100 mL/hr over 30 Minutes Intravenous Every 8 hours 01/31/16 2119 02/08/16 2159   01/31/16 2200  vancomycin (VANCOCIN) 1,750 mg in sodium chloride 0.9 % 500 mL IVPB     1,750 mg 250 mL/hr over 120 Minutes Intravenous  Once 01/31/16 2133 02/01/16 0113   01/27/16 1800  imipenem-cilastatin (PRIMAXIN) 500 mg in sodium chloride 0.9 % 100 mL IVPB  Status:  Discontinued     500 mg 200 mL/hr over 30 Minutes Intravenous Every 8 hours 01/27/16 1206 01/30/16 1616   01/26/16 1200  imipenem-cilastatin (PRIMAXIN) 250 mg in sodium chloride 0.9 % 100 mL IVPB  Status:  Discontinued     250 mg 200 mL/hr over 30 Minutes Intravenous Every 6 hours 01/26/16 1007 01/27/16 1206   01/25/16 0500  imipenem-cilastatin (PRIMAXIN) 250 mg in sodium chloride 0.9 % 100 mL IVPB  Status:  Discontinued     250 mg 200 mL/hr over 30 Minutes Intravenous Every 12 hours 01/24/16 1817 01/26/16 1007   01/24/16 1700  vancomycin (VANCOCIN) 2,000 mg in sodium chloride 0.9 % 500 mL IVPB     2,000 mg 250 mL/hr over 120 Minutes Intravenous  Once 01/24/16 1628 01/24/16 2038   01/24/16 1700  imipenem-cilastatin (PRIMAXIN) 500 mg in sodium chloride 0.9 % 100 mL IVPB     500 mg 200 mL/hr over 30 Minutes Intravenous  Once 01/24/16 1628 01/24/16 1821        Objective:   Filed Vitals:   02/01/16 0439 02/01/16 0725 02/01/16 0756 02/01/16 0900  BP: 117/49   135/59  Pulse: 91   86  Temp: 100.3 F (37.9 C) 100.4 F (38 C)    TempSrc: Axillary Axillary    Resp: 24   22  Height:      Weight:      SpO2: 90%  85% 91%    Wt Readings from Last 3 Encounters:  01/31/16 101.152 kg (223 lb)     Intake/Output Summary (Last 24 hours)  at 02/01/16 1613 Last data filed at 02/01/16 0900  Gross per 24 hour  Intake 2903.75 ml  Output    350 ml  Net 2553.75 ml     Physical Exam  ZOX:WRUEAVW sleepy but  arousable, desaturating requiting NRB  HEENT: Moist mucosa, supple neck  Chest:Coarse breath sounds bilaterally  CVS: N S1&S2, 3/6 systolic murmur, no rubs or gallop GI: soft, NT, ND, BS+ Musculoskeletal: warm, no edema, dressing over left hip. UJW:JXBJYN but arousable   Data Review:    CBC  Recent Labs Lab 01/27/16 0215 01/28/16 0515  WBC 10.3 9.6  HGB 9.5* 9.8*  HCT 30.9* 32.8*  PLT 166 205  MCV 95.4 98.2  MCH 29.3 29.3  MCHC 30.7 29.9*  RDW 16.4* 16.7*  LYMPHSABS 0.6* 1.1  MONOABS 0.8 0.7  EOSABS 0.0 0.1  BASOSABS 0.0 0.0    Chemistries   Recent Labs Lab 01/25/16 1655  01/26/16 0549 01/27/16 0215  01/30/16 0614 01/30/16 1819 01/31/16 0450 01/31/16 1824 02/01/16 0537  NA  --   < > 151* 156*  < > 159* 155* 155* 154* 155*  K  --   < > 3.8 3.9  < > 3.7 3.5 3.9 3.5 3.8  CL  --   < > 122* 129*  < > 129* 127* 125* 125* 124*  CO2  --   < > 21* 21*  < > 24 23 23  21* 23  GLUCOSE  --   < > 273* 250*  < > 239* 266* 270* 159* 236*  BUN  --   < > 62* 47*  < > 40* 39* 33* 29* 29*  CREATININE  --   < > 2.57* 1.98*  < > 1.91* 2.07* 1.90* 2.07* 2.19*  CALCIUM  --   < > 8.8* 8.9  < > 8.2* 8.0* 7.7* 7.7* 7.5*  MG 1.9  --  1.9 1.8  --   --   --   --   --   --   < > = values in this interval not displayed. ------------------------------------------------------------------------------------------------------------------ No results for input(s): CHOL, HDL, LDLCALC, TRIG, CHOLHDL, LDLDIRECT in the last 72 hours.  No results found for: HGBA1C ------------------------------------------------------------------------------------------------------------------ No results for input(s): TSH, T4TOTAL, T3FREE, THYROIDAB in the last 72 hours.  Invalid input(s): FREET3 ------------------------------------------------------------------------------------------------------------------ No results for input(s): VITAMINB12, FOLATE, FERRITIN, TIBC, IRON, RETICCTPCT in the last 72  hours.  Coagulation profile No results for input(s): INR, PROTIME in the last 168 hours.  No results for input(s): DDIMER in the last 72 hours.  Cardiac Enzymes No results for input(s): CKMB, TROPONINI, MYOGLOBIN in the last 168 hours.  Invalid input(s): CK ------------------------------------------------------------------------------------------------------------------    Component Value Date/Time   BNP 243.6* 01/24/2016 1633    Inpatient Medications  Scheduled Meds: . antiseptic oral rinse  7 mL Mouth Rinse BID  . aztreonam  2 g Intravenous Q8H  . citalopram  20 mg Oral Daily  . dipyridamole-aspirin  1 capsule Oral BID  . feeding supplement (GLUCERNA SHAKE)  237 mL Oral TID WC  . free water  300 mL Oral Q6H  . furosemide      . heparin  5,000 Units Subcutaneous Q8H  . insulin aspart  0-15 Units Subcutaneous TID WC  . insulin aspart  0-5 Units Subcutaneous QHS  . insulin aspart  7 Units Subcutaneous TID WC  . insulin glargine  40 Units Subcutaneous Daily  . ipratropium-albuterol  3 mL Nebulization TID  . isosorbide mononitrate  30 mg Oral Daily  .  memantine  28 mg Oral Q supper  . metoprolol tartrate  12.5 mg Oral BID  . niacin  500 mg Oral QHS  . pantoprazole  40 mg Oral Daily  . pravastatin  40 mg Oral QHS  . QUEtiapine  25 mg Oral Q1200  . Rivastigmine  13.3 mg Transdermal Daily  . tamsulosin  0.4 mg Oral QHS  . vancomycin  1,250 mg Intravenous Q24H   Continuous Infusions:   PRN Meds:.acetaminophen (TYLENOL) oral liquid 160 mg/5 mL, ipratropium-albuterol  Micro Results Recent Results (from the past 240 hour(s))  Culture, blood (routine x 2)     Status: None   Collection Time: 01/24/16  4:33 PM  Result Value Ref Range Status   Specimen Description BLOOD BLOOD LEFT HAND  Final   Special Requests IN PEDIATRIC BOTTLE 3CC  Final   Culture NO GROWTH 5 DAYS  Final   Report Status 01/29/2016 FINAL  Final  Culture, blood (routine x 2)     Status: None   Collection  Time: 01/24/16  4:41 PM  Result Value Ref Range Status   Specimen Description BLOOD BLOOD RIGHT HAND  Final   Special Requests BOTTLES DRAWN AEROBIC AND ANAEROBIC 5CC  Final   Culture NO GROWTH 5 DAYS  Final   Report Status 01/29/2016 FINAL  Final  MRSA PCR Screening     Status: None   Collection Time: 01/25/16  9:23 AM  Result Value Ref Range Status   MRSA by PCR NEGATIVE NEGATIVE Final    Comment:        The GeneXpert MRSA Assay (FDA approved for NASAL specimens only), is one component of a comprehensive MRSA colonization surveillance program. It is not intended to diagnose MRSA infection nor to guide or monitor treatment for MRSA infections.   Culture, Urine     Status: None   Collection Time: 01/25/16  4:24 PM  Result Value Ref Range Status   Specimen Description URINE, CATHETERIZED  Final   Special Requests NONE  Final   Culture NO GROWTH  Final   Report Status 01/26/2016 FINAL  Final  Culture, Urine     Status: None   Collection Time: 01/31/16 11:45 AM  Result Value Ref Range Status   Specimen Description URINE, CLEAN CATCH  Final   Special Requests Normal  Final   Culture NO GROWTH  Final   Report Status 02/01/2016 FINAL  Final    Radiology Reports Dg Chest Port 1 View  02/01/2016  CLINICAL DATA:  Patient with history of pneumonia. Respiratory distress. EXAM: PORTABLE CHEST 1 VIEW COMPARISON:  Chest radiograph 01/31/2016. FINDINGS: Monitoring leads overlie the patient. Stable cardiomegaly. Unchanged mid lower lung perihilar interstitial pulmonary opacities. More focal consolidation left lower lung. Small left pleural effusion. No definite pneumothorax. IMPRESSION: Mid and lower lung perihilar interstitial opacities may represent pulmonary edema in the appropriate clinical setting. More focal consolidation left lower lung may represent atelectasis or infection. Small left pleural effusion. Electronically Signed   By: Annia Belt M.D.   On: 02/01/2016 14:57   Dg Chest  Port 1 View  01/31/2016  CLINICAL DATA:  Fever.  Shortness of breath. EXAM: PORTABLE CHEST 1 VIEW COMPARISON:  01/26/2016 chest radiograph. FINDINGS: Stable cardiomediastinal silhouette with mild cardiomegaly. No pneumothorax. Stable small left pleural effusion. Stable patchy opacities at the left greater than right lung bases. Stable diffuse linear parahilar interstitial opacities suggesting mild pulmonary edema. IMPRESSION: 1. Stable mild congestive heart failure and small left pleural effusion . 2. Stable  patchy bibasilar lung opacities, left greater than right, favor atelectasis, cannot exclude a component of pneumonia or aspiration. Electronically Signed   By: Delbert Phenix M.D.   On: 01/31/2016 11:55   Dg Chest Port 1 View  01/26/2016  CLINICAL DATA:  Respiratory failure. EXAM: PORTABLE CHEST 1 VIEW COMPARISON:  01/25/2016. FINDINGS: Endotracheal and NG tube in stable position. Cardiomegaly with pulmonary vascular congestion and interstitial prominence along the left side pleural effusion no suggesting congestive heart failure. Persistent infiltrate next right mid lung again noted. Continued follow-up exams again suggested to demonstrate clearing. No pneumothorax. IMPRESSION: 1. Lines and tubes in stable position. 2. Persistent infiltrate right mid lung again noted. Continued follow-up chest x-rays to demonstrate clearing suggested. 3. Cardiomegaly with new onset pulmonary venous congestion and bilateral interstitial prominence along with left-sided pleural effusion. Findings consistent with congestive heart failure. Electronically Signed   By: Maisie Fus  Register   On: 01/26/2016 06:52   Dg Chest Port 1 View  01/25/2016  CLINICAL DATA:  Fever. EXAM: PORTABLE CHEST 1 VIEW COMPARISON:  January 24, 2016 FINDINGS: Stable support apparatus. No pneumothorax. The focal opacity in the medial right lung base has resolved. Minimal opacity in the bases may simply represent atelectasis. No other changes. IMPRESSION:  Resolution of the focal opacity in the medial right lung base. Minimal residual opacity in the bases, likely atelectasis. Electronically Signed   By: Gerome Sam III M.D   On: 01/25/2016 08:31   Dg Chest Port 1 View  01/24/2016  CLINICAL DATA:  Respiratory failure. EXAM: PORTABLE CHEST 1 VIEW COMPARISON:  None. FINDINGS: Endotracheal tube in satisfactory position. Nasogastric tube extending into the stomach. Mildly enlarged cardiac silhouette. Patchy opacity in the right mid to lower lung zone, medially. There is also airspace opacity in the left lower lobe. Possible small left pleural effusion. Diffuse osteopenia. IMPRESSION: 1. Probable pneumonia in the right mid to lower lung zone, medially. A mass cannot be excluded and this will require follow-up. 2. Left lower lobe atelectasis or pneumonia. 3. Possible small left pleural effusion. 4. Mild cardiomegaly. Electronically Signed   By: Beckie Salts M.D.   On: 01/24/2016 17:08    Time Spent in minutes  35   Eddie North M.D on 02/01/2016 at 4:13 PM  Between 7am to 7pm - Pager - [phone]  After 7pm go to [email or web address] - password [facility]  [facility] -  Office  [phone]

## 2016-02-01 NOTE — Significant Event (Signed)
Rapid Response Event Note  Overview:  Called by RN for patient unresponsive with Respiratory distress sats noted to be 78% on 4LPM. Time Called: 1412 Arrival Time: 1420 Event Type: Respiratory, Neurologic  Initial Focused Assessment :Upon my arrival to patients room, Rn at bedside.  Patient is in bed on NRB, moans to pain.  Skin cool and dry.  Breath Sounds coarse.  VSS 169/92, 91, 98% on NRB.  Interventions:  MD paged and updated prior to my arrival.  RT at bedside to obtain ABG.  Results ph 7.36, co239.6, O2 56.8, 87%, HCO3 21.9, results given to MD at bedside  Plan of Care (if not transferred): Patient to transfer to SDU, RN to call if assistance needed  Event Summary:  No RRT interventions at this time, await results of ABG   at      at          Jared [name], Jared [name]

## 2016-02-02 ENCOUNTER — Inpatient Hospital Stay (HOSPITAL_COMMUNITY): Payer: Medicare Other

## 2016-02-02 LAB — GLUCOSE, CAPILLARY
GLUCOSE-CAPILLARY: 128 mg/dL — AB (ref 65–99)
GLUCOSE-CAPILLARY: 183 mg/dL — AB (ref 65–99)
GLUCOSE-CAPILLARY: 219 mg/dL — AB (ref 65–99)
Glucose-Capillary: 141 mg/dL — ABNORMAL HIGH (ref 65–99)

## 2016-02-02 LAB — BLOOD GAS, ARTERIAL
Acid-base deficit: 3 mmol/L — ABNORMAL HIGH (ref 0.0–2.0)
BICARBONATE: 22.5 meq/L (ref 20.0–24.0)
FIO2: 0.5
O2 Saturation: 90.9 %
PCO2 ART: 47.3 mmHg — AB (ref 35.0–45.0)
PH ART: 7.298 — AB (ref 7.350–7.450)
PO2 ART: 66.1 mmHg — AB (ref 80.0–100.0)
Patient temperature: 98.6
TCO2: 23.9 mmol/L (ref 0–100)

## 2016-02-02 LAB — BASIC METABOLIC PANEL
ANION GAP: 9 (ref 5–15)
BUN: 48 mg/dL — AB (ref 6–20)
CHLORIDE: 125 mmol/L — AB (ref 101–111)
CO2: 20 mmol/L — ABNORMAL LOW (ref 22–32)
Calcium: 7.4 mg/dL — ABNORMAL LOW (ref 8.9–10.3)
Creatinine, Ser: 3.22 mg/dL — ABNORMAL HIGH (ref 0.61–1.24)
GFR calc Af Amer: 21 mL/min — ABNORMAL LOW (ref >60)
GFR, EST NON AFRICAN AMERICAN: 18 mL/min — AB (ref >60)
Glucose, Bld: 158 mg/dL — ABNORMAL HIGH (ref 65–99)
POTASSIUM: 3.9 mmol/L (ref 3.5–5.1)
SODIUM: 154 mmol/L — AB (ref 135–145)

## 2016-02-02 LAB — CBC
HEMATOCRIT: 32.2 % — AB (ref 39.0–52.0)
HEMOGLOBIN: 9.4 g/dL — AB (ref 13.0–17.0)
MCH: 29.1 pg (ref 26.0–34.0)
MCHC: 29.2 g/dL — ABNORMAL LOW (ref 30.0–36.0)
MCV: 99.7 fL (ref 78.0–100.0)
Platelets: 125 10*3/uL — ABNORMAL LOW (ref 150–400)
RBC: 3.23 MIL/uL — AB (ref 4.22–5.81)
RDW: 17.9 % — AB (ref 11.5–15.5)
WBC: 19.5 10*3/uL — AB (ref 4.0–10.5)

## 2016-02-02 LAB — VANCOMYCIN, RANDOM: Vancomycin Rm: 38 ug/mL

## 2016-02-02 MED ORDER — INSULIN GLARGINE 100 UNIT/ML ~~LOC~~ SOLN
30.0000 [IU] | Freq: Every day | SUBCUTANEOUS | Status: DC
Start: 2016-02-02 — End: 2016-02-03
  Administered 2016-02-02: 30 [IU] via SUBCUTANEOUS
  Filled 2016-02-02 (×2): qty 0.3

## 2016-02-02 MED ORDER — DEXTROSE 5 % IV SOLN
INTRAVENOUS | Status: DC
  Administered 2016-02-03 – 2016-02-04 (×3): via INTRAVENOUS

## 2016-02-02 MED ORDER — FUROSEMIDE 10 MG/ML IJ SOLN
80.0000 mg | Freq: Once | INTRAMUSCULAR | Status: AC
  Administered 2016-02-02: 80 mg via INTRAVENOUS
  Filled 2016-02-02: qty 8

## 2016-02-02 MED ORDER — DEXTROSE 5 % IV SOLN
1.0000 g | Freq: Three times a day (TID) | INTRAVENOUS | Status: DC
  Administered 2016-02-02 – 2016-02-05 (×9): 1 g via INTRAVENOUS
  Filled 2016-02-02 (×10): qty 1

## 2016-02-02 MED ORDER — ISOSORBIDE MONONITRATE ER 30 MG PO TB24
30.0000 mg | ORAL_TABLET | Freq: Every day | ORAL | Status: DC
  Filled 2016-02-02 (×2): qty 1

## 2016-02-02 NOTE — Progress Notes (Addendum)
PROGRESS NOTE                                                                                                                                                                                                             Patient Demographics:    Jared [name], is a 75 y.o. male, DOB - February 26, 1941, WUJ:[id number]  Admit date - 01/24/2016   Admitting Physician Lupita Leash, MD  Outpatient Primary MD for the patient is Feliciana Rossetti, MD  LOS - 9  Outpatient Specialists:not on file  No chief complaint on file.      Brief Narrative   75 year old male with history of CVA with residual left-sided weakness, dementia, diabetes mellitus admitted to [facility] on 6/7 after a fall at home with left femoral neck fracture which was surgically repaired. Postoperatively he was unable to be weaned from the vent and had progressive renal failure with volume overload and transferred to [facility] on 6/10 for ongoing care and renal evaluation. Cultures were negative. Received antibiotic for possible healthcare associated pneumonia and transferred to hospitalist service once extubated on 6/12.  transferred to hospitalist service and completed antibiotics on 6/16. However on 6/17 patient became febrile with possible aspiration pneumonia and on 6/18 became septic and went into acute hypoxic respiratory failure requiting NRB and transfer to stepdown unit.   Subjective:   Patient became septic on 6/17-6/18 with acute hypoxic respiratory failure possibly due to aspiration pneumonia, required NRB and transferred to stepdown unit. Patient requiring BiPAP since last night. Blood gas showing hypoxemia.    Assessment  & Plan :    Sepsis with acute hypoxemic respiratory failure Secondary to acute pulmonary edema and possible healthcare associated pneumonia. Completed 7 days of antibiotics on 6/16. Patient again febrile since 6/17 and desaturating to  the 80s, initially requiring 4 L via nasal cannula and then NRB. Chest x-Rami showing pulmonary edema and worsened consolidation. Restarted empiric vancomycin and aztreonam. Transferred to stepdown unit.    HCAP/aspiration pneumonia Placed on empiric vancomycin and asked her in am. Follow cultures. Worsened leukocytosis. Keep Nothing by mouth. Follow chest x-[place]oy today.  Acute on chronic diastolic CHF Received diuretic in the ICU. Was started on gentle hydration due to hypovolemia.  Continue metoprolol.hold off  ACE inhibitor given his acute kidney injury. Receiving when necessary Lasix. Ordered 80 mg IV today.  Monitor for worsening renal function.  Acute kidney injury Initially thought to be due to hypovolemia. Worsened this morning. Getting when necessary Lasix. Would obtain renal ultrasound once he is off BiPAP. Consult nephrology.  Hypernatremia Suspected due to dehydration with overdiuresis. Given freewater with D5 now receiving intermittent Lasix. Consult nephrology.   Diabetes mellitus with hyperglycemia Reduce Lantus dose to 30 units daily since patient is nothing by mouth. Continue sliding scale coverage  Protein calorie malnutrition added supplements  Left Femoral neck fracture Surgically repaired on 6/7. Has not required pain medication. Lovenox for DVT prophylaxis. As per ordered from note from [place] recommend weightbearing with lateral and anterior hip precautions. PT recommend skilled nursing facility.  CVA with right hemiparesis Continue Aggrenox and statin.  ? Vascular dementia Resume namenda and excelon patch  ? Depression  resume seroquel    Code Status :DO NOT RESUSCITATE  Family Communication  :Wife and son at bedside  Disposition Plan  : Continue stepdown monitoring. Condition guarded.  Barriers For Discharge :Acute hypoxic respiratory failure, pneumonia,  Hypernatremia  Consults  :  PC CM  Procedures  :  Intubation 2-D echo  DVT Prophylaxis   :  Subcutaneous lovenox  Lab Results  Component Value Date   PLT 125* 02/02/2016    Antibiotics  :   Anti-infectives    Start     Dose/Rate Route Frequency Ordered Stop   02/01/16 2300  vancomycin (VANCOCIN) 1,250 mg in sodium chloride 0.9 % 250 mL IVPB     1,250 mg 166.7 mL/hr over 90 Minutes Intravenous Every 24 hours 01/31/16 2133     01/31/16 2200  aztreonam (AZACTAM) 2 g in dextrose 5 % 50 mL IVPB     2 g 100 mL/hr over 30 Minutes Intravenous Every 8 hours 01/31/16 2119 02/08/16 2159   01/31/16 2200  vancomycin (VANCOCIN) 1,750 mg in sodium chloride 0.9 % 500 mL IVPB     1,750 mg 250 mL/hr over 120 Minutes Intravenous  Once 01/31/16 2133 02/01/16 0113   01/27/16 1800  imipenem-cilastatin (PRIMAXIN) 500 mg in sodium chloride 0.9 % 100 mL IVPB  Status:  Discontinued     500 mg 200 mL/hr over 30 Minutes Intravenous Every 8 hours 01/27/16 1206 01/30/16 1616   01/26/16 1200  imipenem-cilastatin (PRIMAXIN) 250 mg in sodium chloride 0.9 % 100 mL IVPB  Status:  Discontinued     250 mg 200 mL/hr over 30 Minutes Intravenous Every 6 hours 01/26/16 1007 01/27/16 1206   01/25/16 0500  imipenem-cilastatin (PRIMAXIN) 250 mg in sodium chloride 0.9 % 100 mL IVPB  Status:  Discontinued     250 mg 200 mL/hr over 30 Minutes Intravenous Every 12 hours 01/24/16 1817 01/26/16 1007   01/24/16 1700  vancomycin (VANCOCIN) 2,000 mg in sodium chloride 0.9 % 500 mL IVPB     2,000 mg 250 mL/hr over 120 Minutes Intravenous  Once 01/24/16 1628 01/24/16 2038   01/24/16 1700  imipenem-cilastatin (PRIMAXIN) 500 mg in sodium chloride 0.9 % 100 mL IVPB     500 mg 200 mL/hr over 30 Minutes Intravenous  Once 01/24/16 1628 01/24/16 1821        Objective:   Filed Vitals:   02/02/16 0501 02/02/16 0600 02/02/16 0721 02/02/16 0843  BP: 102/60 125/58 108/54   Pulse: 73 77 78   Temp: 98.5 F (36.9 C)  98.9 F (37.2 C)   TempSrc: Axillary  Axillary   Resp: 31 19 27    Height:  Weight:      SpO2: 96% 97%  99% 99%    Wt Readings from Last 3 Encounters:  02/02/16 98.6 kg (217 lb 6 oz)     Intake/Output Summary (Last 24 hours) at 02/02/16 1139 Last data filed at 02/02/16 0501  Gross per 24 hour  Intake    300 ml  Output    720 ml  Net   -420 ml     Physical Exam  Gen:On BiPAP, arousable HEENT: on BiPAP Chest:Coarse breath sounds bilaterally  CVS: N S1&S2, 3/6 systolic murmur, no rubs or gallop GI: soft, NT, ND, BS+ Musculoskeletal: warm, no edema, dressing over left hip.    Data Review:    CBC  Recent Labs Lab 01/27/16 0215 01/28/16 0515 02/02/16 0746  WBC 10.3 9.6 19.5*  HGB 9.5* 9.8* 9.4*  HCT 30.9* 32.8* 32.2*  PLT 166 205 125*  MCV 95.4 98.2 99.7  MCH 29.3 29.3 29.1  MCHC 30.7 29.9* 29.2*  RDW 16.4* 16.7* 17.9*  LYMPHSABS 0.6* 1.1  --   MONOABS 0.8 0.7  --   EOSABS 0.0 0.1  --   BASOSABS 0.0 0.0  --     Chemistries   Recent Labs Lab 01/27/16 0215  01/30/16 1819 01/31/16 0450 01/31/16 1824 02/01/16 0537 02/02/16 0746  NA 156*  < > 155* 155* 154* 155* 154*  K 3.9  < > 3.5 3.9 3.5 3.8 3.9  CL 129*  < > 127* 125* 125* 124* 125*  CO2 21*  < > 23 23 21* 23 20*  GLUCOSE 250*  < > 266* 270* 159* 236* 158*  BUN 47*  < > 39* 33* 29* 29* 48*  CREATININE 1.98*  < > 2.07* 1.90* 2.07* 2.19* 3.22*  CALCIUM 8.9  < > 8.0* 7.7* 7.7* 7.5* 7.4*  MG 1.8  --   --   --   --   --   --   < > = values in this interval not displayed. ------------------------------------------------------------------------------------------------------------------ No results for input(s): CHOL, HDL, LDLCALC, TRIG, CHOLHDL, LDLDIRECT in the last 72 hours.  No results found for: HGBA1C ------------------------------------------------------------------------------------------------------------------ No results for input(s): TSH, T4TOTAL, T3FREE, THYROIDAB in the last 72 hours.  Invalid input(s):  FREET3 ------------------------------------------------------------------------------------------------------------------ No results for input(s): VITAMINB12, FOLATE, FERRITIN, TIBC, IRON, RETICCTPCT in the last 72 hours.  Coagulation profile No results for input(s): INR, PROTIME in the last 168 hours.  No results for input(s): DDIMER in the last 72 hours.  Cardiac Enzymes No results for input(s): CKMB, TROPONINI, MYOGLOBIN in the last 168 hours.  Invalid input(s): CK ------------------------------------------------------------------------------------------------------------------    Component Value Date/Time   BNP 243.6* 01/24/2016 1633    Inpatient Medications  Scheduled Meds: . antiseptic oral rinse  7 mL Mouth Rinse BID  . aztreonam  2 g Intravenous Q8H  . citalopram  20 mg Oral Daily  . dipyridamole-aspirin  1 capsule Oral BID  . feeding supplement (GLUCERNA SHAKE)  237 mL Oral TID WC  . heparin  5,000 Units Subcutaneous Q8H  . insulin aspart  0-15 Units Subcutaneous TID WC  . insulin aspart  0-5 Units Subcutaneous QHS  . insulin glargine  30 Units Subcutaneous Daily  . ipratropium-albuterol  3 mL Nebulization TID  . isosorbide mononitrate  30 mg Oral Daily  . memantine  28 mg Oral Q supper  . metoprolol tartrate  12.5 mg Oral BID  . niacin  500 mg Oral QHS  . pantoprazole  40 mg Oral Daily  . pravastatin  40 mg Oral QHS  . QUEtiapine  25 mg Oral Q1200  . Rivastigmine  13.3 mg Transdermal Daily  . tamsulosin  0.4 mg Oral QHS  . vancomycin  1,250 mg Intravenous Q24H   Continuous Infusions:   PRN Meds:.acetaminophen (TYLENOL) oral liquid 160 mg/5 mL, acetaminophen, ipratropium-albuterol  Micro Results Recent Results (from the past 240 hour(s))  Culture, blood (routine x 2)     Status: None   Collection Time: 01/24/16  4:33 PM  Result Value Ref Range Status   Specimen Description BLOOD BLOOD LEFT HAND  Final   Special Requests IN PEDIATRIC BOTTLE 3CC  Final    Culture NO GROWTH 5 DAYS  Final   Report Status 01/29/2016 FINAL  Final  Culture, blood (routine x 2)     Status: None   Collection Time: 01/24/16  4:41 PM  Result Value Ref Range Status   Specimen Description BLOOD BLOOD RIGHT HAND  Final   Special Requests BOTTLES DRAWN AEROBIC AND ANAEROBIC 5CC  Final   Culture NO GROWTH 5 DAYS  Final   Report Status 01/29/2016 FINAL  Final  MRSA PCR Screening     Status: None   Collection Time: 01/25/16  9:23 AM  Result Value Ref Range Status   MRSA by PCR NEGATIVE NEGATIVE Final    Comment:        The GeneXpert MRSA Assay (FDA approved for NASAL specimens only), is one component of a comprehensive MRSA colonization surveillance program. It is not intended to diagnose MRSA infection nor to guide or monitor treatment for MRSA infections.   Culture, Urine     Status: None   Collection Time: 01/25/16  4:24 PM  Result Value Ref Range Status   Specimen Description URINE, CATHETERIZED  Final   Special Requests NONE  Final   Culture NO GROWTH  Final   Report Status 01/26/2016 FINAL  Final  Culture, blood (routine x 2)     Status: None (Preliminary result)   Collection Time: 01/31/16 10:15 AM  Result Value Ref Range Status   Specimen Description BLOOD LEFT HAND  Final   Special Requests BOTTLES DRAWN AEROBIC ONLY 7CC  Final   Culture NO GROWTH 1 DAY  Final   Report Status PENDING  Incomplete  Culture, blood (routine x 2)     Status: None (Preliminary result)   Collection Time: 01/31/16 10:20 AM  Result Value Ref Range Status   Specimen Description BLOOD RIGHT ANTECUBITAL  Final   Special Requests BOTTLES DRAWN AEROBIC AND ANAEROBIC 5CC  Final   Culture NO GROWTH 1 DAY  Final   Report Status PENDING  Incomplete  Culture, Urine     Status: None   Collection Time: 01/31/16 11:45 AM  Result Value Ref Range Status   Specimen Description URINE, CLEAN CATCH  Final   Special Requests Normal  Final   Culture NO GROWTH  Final   Report Status  02/01/2016 FINAL  Final    Radiology Reports Dg Chest Port 1 View  02/01/2016  CLINICAL DATA:  Patient with history of pneumonia. Respiratory distress. EXAM: PORTABLE CHEST 1 VIEW COMPARISON:  Chest radiograph 01/31/2016. FINDINGS: Monitoring leads overlie the patient. Stable cardiomegaly. Unchanged mid lower lung perihilar interstitial pulmonary opacities. More focal consolidation left lower lung. Small left pleural effusion. No definite pneumothorax. IMPRESSION: Mid and lower lung perihilar interstitial opacities may represent pulmonary edema in the appropriate clinical setting. More focal consolidation left lower lung may represent atelectasis or infection. Small left pleural effusion.  Electronically Signed   By: Annia Belt M.D.   On: 02/01/2016 14:57   Dg Chest Port 1 View  01/31/2016  CLINICAL DATA:  Fever.  Shortness of breath. EXAM: PORTABLE CHEST 1 VIEW COMPARISON:  01/26/2016 chest radiograph. FINDINGS: Stable cardiomediastinal silhouette with mild cardiomegaly. No pneumothorax. Stable small left pleural effusion. Stable patchy opacities at the left greater than right lung bases. Stable diffuse linear parahilar interstitial opacities suggesting mild pulmonary edema. IMPRESSION: 1. Stable mild congestive heart failure and small left pleural effusion . 2. Stable patchy bibasilar lung opacities, left greater than right, favor atelectasis, cannot exclude a component of pneumonia or aspiration. Electronically Signed   By: Delbert Phenix M.D.   On: 01/31/2016 11:55   Dg Chest Port 1 View  01/26/2016  CLINICAL DATA:  Respiratory failure. EXAM: PORTABLE CHEST 1 VIEW COMPARISON:  01/25/2016. FINDINGS: Endotracheal and NG tube in stable position. Cardiomegaly with pulmonary vascular congestion and interstitial prominence along the left side pleural effusion no suggesting congestive heart failure. Persistent infiltrate next right mid lung again noted. Continued follow-up exams again suggested to demonstrate  clearing. No pneumothorax. IMPRESSION: 1. Lines and tubes in stable position. 2. Persistent infiltrate right mid lung again noted. Continued follow-up chest x-rays to demonstrate clearing suggested. 3. Cardiomegaly with new onset pulmonary venous congestion and bilateral interstitial prominence along with left-sided pleural effusion. Findings consistent with congestive heart failure. Electronically Signed   By: Maisie Fus  Register   On: 01/26/2016 06:52   Dg Chest Port 1 View  01/25/2016  CLINICAL DATA:  Fever. EXAM: PORTABLE CHEST 1 VIEW COMPARISON:  January 24, 2016 FINDINGS: Stable support apparatus. No pneumothorax. The focal opacity in the medial right lung base has resolved. Minimal opacity in the bases may simply represent atelectasis. No other changes. IMPRESSION: Resolution of the focal opacity in the medial right lung base. Minimal residual opacity in the bases, likely atelectasis. Electronically Signed   By: Gerome Sam III M.D   On: 01/25/2016 08:31   Dg Chest Port 1 View  01/24/2016  CLINICAL DATA:  Respiratory failure. EXAM: PORTABLE CHEST 1 VIEW COMPARISON:  None. FINDINGS: Endotracheal tube in satisfactory position. Nasogastric tube extending into the stomach. Mildly enlarged cardiac silhouette. Patchy opacity in the right mid to lower lung zone, medially. There is also airspace opacity in the left lower lobe. Possible small left pleural effusion. Diffuse osteopenia. IMPRESSION: 1. Probable pneumonia in the right mid to lower lung zone, medially. A mass cannot be excluded and this will require follow-up. 2. Left lower lobe atelectasis or pneumonia. 3. Possible small left pleural effusion. 4. Mild cardiomegaly. Electronically Signed   By: Beckie Salts M.D.   On: 01/24/2016 17:08    Time Spent in minutes  35   Eddie North M.D on 02/02/2016 at 11:39 AM  Between 7am to 7pm - Pager - [phone]  After 7pm go to [email or web address] - password [facility]  [facility] -  Office   [phone]

## 2016-02-02 NOTE — Consult Note (Signed)
75yo male with hx dementia, DM, previous CVA with residual L sided weakness initially admitted to [facility] 6/7 after a fall at home with L femoral neck fx which was surgically repaired. On admission on 6/7 creat was 2.81m/dl and reportedly baseline creat was 1.255mdl.     Pt was taking enalapril 20 mg daily for hypertension.  He has dementia at baseline.  Post op he was unable to wean from vent. Pt had progressive renal failure, reported volume overload and was tx to [facility] 6/10 for ongoing care and renal eval with creat on 6/10 of 3.65m58ml.  EF on 6/11 revealed an EF of 55%. Pt was diuresed initially. Then got IVFs. There has been fluctuating renal function and onset of persistent hypernatremia.  Renal consultation requested today.  Past Medical History  Diagnosis Date  . High blood pressure   . GERD (gastroesophageal reflux disease)   . Bruises easily   . Anxiety   . Hearing loss   . Diabetes mellitus   . [facility]  Past Surgical History  Procedure Laterality Date  . Gallbladder surgery     Social History:  reports that he has never smoked. He has never used smokeless tobacco. He reports that he does not drink alcohol. His drug history is not on file. Allergies:  Allergies  Allergen Reactions  . Penicillins Hives    Early childhood reaction - no other information available   No family history on file.  Medications:  Scheduled: . antiseptic oral rinse  7 mL Mouth Rinse BID  . aztreonam  1 g Intravenous Q8H  . citalopram  20 mg Oral Daily  . dipyridamole-aspirin  1 capsule Oral BID  . feeding supplement (GLUCERNA SHAKE)  237 mL Oral TID WC  . heparin  5,000 Units Subcutaneous Q8H  . insulin aspart  0-15 Units Subcutaneous TID WC  . insulin aspart  0-5 Units Subcutaneous QHS  . insulin glargine  30 Units Subcutaneous Daily  . ipratropium-albuterol  3 mL Nebulization TID  . isosorbide mononitrate  30 mg Oral Daily  . memantine  28 mg Oral Q  supper  . metoprolol tartrate  12.5 mg Oral BID  . niacin  500 mg Oral QHS  . pantoprazole  40 mg Oral Daily  . pravastatin  40 mg Oral QHS  . QUEtiapine  25 mg Oral Q1200  . Rivastigmine  13.3 mg Transdermal Daily  . tamsulosin  0.4 mg Oral QHS   ROS: has baseline demetia  Blood pressure 110/61, pulse 85, temperature 98.5 F (36.9 C), temperature source Oral, resp. rate 28, height 5' 10"  (1.778 m), weight 98.6 kg (217 lb 6 oz), SpO2 95 %.  General appearance: awake Head: Normocephalic, without obvious abnormality, atraumatic Eyes: negative Nose: Nares normal. Septum midline. Mucosa normal. No drainage or sinus tenderness. Resp: clear to auscultation bilaterally Chest wall: no tenderness Cardio: regular rate and rhythm, S1, S2 normal, no murmur, click, rub or gallop GI: soft, non-tender; bowel sounds normal; no masses,  no organomegaly Extremities: extremities normal, atraumatic, no cyanosis or edema Skin: mild tenting Neurologic: Grossly normal some confusion Results for orders placed or performed during the hospital encounter of 01/24/16 (from the past 48 hour(s))  Glucose, capillary     Status: Abnormal   Collection Time: 01/31/16  4:14 PM  Result Value Ref Range   Glucose-Capillary 152 (H) 65 - 99 mg/dL  Basic metabolic panel     Status: Abnormal  Collection Time: 01/31/16  6:24 PM  Result Value Ref Range   Sodium 154 (H) 135 - 145 mmol/L   Potassium 3.5 3.5 - 5.1 mmol/L   Chloride 125 (H) 101 - 111 mmol/L   CO2 21 (L) 22 - 32 mmol/L   Glucose, Bld 159 (H) 65 - 99 mg/dL   BUN 29 (H) 6 - 20 mg/dL   Creatinine, Ser 2.07 (H) 0.61 - 1.24 mg/dL   Calcium 7.7 (L) 8.9 - 10.3 mg/dL   GFR calc non Af Amer 30 (L) >60 mL/min   GFR calc Af Amer 35 (L) >60 mL/min    Comment: (NOTE) The eGFR has been calculated using the CKD EPI equation. This calculation has not been validated in all clinical situations. eGFR's persistently <60 mL/min signify possible Chronic Kidney Disease.     Anion gap 8 5 - 15  Glucose, capillary     Status: Abnormal   Collection Time: 01/31/16  9:10 PM  Result Value Ref Range   Glucose-Capillary 166 (H) 65 - 99 mg/dL  HIV antibody     Status: None   Collection Time: 01/31/16 10:24 PM  Result Value Ref Range   HIV Screen 4th Generation wRfx Non Reactive Non Reactive    Comment: (NOTE) Performed At: [facility] [place], [place] [id number] Lindon Romp MD [id number]   Basic metabolic panel     Status: Abnormal   Collection Time: 02/01/16  5:37 AM  Result Value Ref Range   Sodium 155 (H) 135 - 145 mmol/L   Potassium 3.8 3.5 - 5.1 mmol/L   Chloride 124 (H) 101 - 111 mmol/L   CO2 23 22 - 32 mmol/L   Glucose, Bld 236 (H) 65 - 99 mg/dL   BUN 29 (H) 6 - 20 mg/dL   Creatinine, Ser 2.19 (H) 0.61 - 1.24 mg/dL   Calcium 7.5 (L) 8.9 - 10.3 mg/dL   GFR calc non Af Amer 28 (L) >60 mL/min   GFR calc Af Amer 33 (L) >60 mL/min    Comment: (NOTE) The eGFR has been calculated using the CKD EPI equation. This calculation has not been validated in all clinical situations. eGFR's persistently <60 mL/min signify possible Chronic Kidney Disease.    Anion gap 8 5 - 15  Glucose, capillary     Status: Abnormal   Collection Time: 02/01/16  7:42 AM  Result Value Ref Range   Glucose-Capillary 268 (H) 65 - 99 mg/dL  Glucose, capillary     Status: Abnormal   Collection Time: 02/01/16 11:42 AM  Result Value Ref Range   Glucose-Capillary 301 (H) 65 - 99 mg/dL  Blood gas, arterial     Status: Abnormal   Collection Time: 02/01/16  2:40 PM  Result Value Ref Range   FIO2 1.00    Delivery systems NON-REBREATHER OXYGEN MASK    pH, Arterial 7.362 7.350 - 7.450   pCO2 arterial 39.6 35.0 - 45.0 mmHg   pO2, Arterial 56.8 (L) 80.0 - 100.0 mmHg   Bicarbonate 21.9 20.0 - 24.0 mEq/L   TCO2 23.2 0 - 100 mmol/L   Acid-base deficit 2.6 (H) 0.0 - 2.0 mmol/L   O2 Saturation 87.2 %   Patient temperature 98.6    Collection site RIGHT  RADIAL    Drawn by [id number]    Sample type ARTERIAL DRAW    Allens test (pass/fail) PASS PASS  Glucose, capillary     Status: Abnormal   Collection Time: 02/01/16  4:33 PM  Result Value Ref Range   Glucose-Capillary 234 (H) 65 - 99 mg/dL  Glucose, capillary     Status: Abnormal   Collection Time: 02/01/16  6:22 PM  Result Value Ref Range   Glucose-Capillary 194 (H) 65 - 99 mg/dL  Glucose, capillary     Status: Abnormal   Collection Time: 02/01/16  8:51 PM  Result Value Ref Range   Glucose-Capillary 156 (H) 65 - 99 mg/dL  Glucose, capillary     Status: Abnormal   Collection Time: 02/02/16  7:31 AM  Result Value Ref Range   Glucose-Capillary 141 (H) 65 - 99 mg/dL  Basic metabolic panel     Status: Abnormal   Collection Time: 02/02/16  7:46 AM  Result Value Ref Range   Sodium 154 (H) 135 - 145 mmol/L   Potassium 3.9 3.5 - 5.1 mmol/L   Chloride 125 (H) 101 - 111 mmol/L   CO2 20 (L) 22 - 32 mmol/L   Glucose, Bld 158 (H) 65 - 99 mg/dL   BUN 48 (H) 6 - 20 mg/dL   Creatinine, Ser 3.22 (H) 0.61 - 1.24 mg/dL    Comment: REPEATED TO VERIFY   Calcium 7.4 (L) 8.9 - 10.3 mg/dL   GFR calc non Af Amer 18 (L) >60 mL/min   GFR calc Af Amer 21 (L) >60 mL/min    Comment: (NOTE) The eGFR has been calculated using the CKD EPI equation. This calculation has not been validated in all clinical situations. eGFR's persistently <60 mL/min signify possible Chronic Kidney Disease.    Anion gap 9 5 - 15  CBC     Status: Abnormal   Collection Time: 02/02/16  7:46 AM  Result Value Ref Range   WBC 19.5 (H) 4.0 - 10.5 K/uL   RBC 3.23 (L) 4.22 - 5.81 MIL/uL   Hemoglobin 9.4 (L) 13.0 - 17.0 g/dL   HCT 32.2 (L) 39.0 - 52.0 %   MCV 99.7 78.0 - 100.0 fL   MCH 29.1 26.0 - 34.0 pg   MCHC 29.2 (L) 30.0 - 36.0 g/dL   RDW 17.9 (H) 11.5 - 15.5 %   Platelets 125 (L) 150 - 400 K/uL    Comment: SPECIMEN CHECKED FOR CLOTS REPEATED TO VERIFY PLATELET COUNT CONFIRMED BY SMEAR   Blood gas, arterial     Status:  Abnormal   Collection Time: 02/02/16  9:28 AM  Result Value Ref Range   FIO2 0.50    pH, Arterial 7.298 (L) 7.350 - 7.450   pCO2 arterial 47.3 (H) 35.0 - 45.0 mmHg   pO2, Arterial 66.1 (L) 80.0 - 100.0 mmHg   Bicarbonate 22.5 20.0 - 24.0 mEq/L   TCO2 23.9 0 - 100 mmol/L   Acid-base deficit 3.0 (H) 0.0 - 2.0 mmol/L   O2 Saturation 90.9 %   Patient temperature 98.6    Allens test (pass/fail) PASS PASS  Glucose, capillary     Status: Abnormal   Collection Time: 02/02/16 12:28 PM  Result Value Ref Range   Glucose-Capillary 128 (H) 65 - 99 mg/dL   Dg Chest Port 1 View  02/02/2016  CLINICAL DATA:  75 year old male with respiratory distress and chest congestion. Initial encounter. EXAM: PORTABLE CHEST 1 VIEW COMPARISON:  02/01/2016 and earlier. FINDINGS: Portable AP semi upright view at 1318 hours. The patient is mildly rotated to the right. Mediastinal contours are stable and within normal limits. No pneumothorax. No pulmonary edema or definite effusion. Dense retrocardiac opacity persists with suggestion of air bronchograms, and is  mostly obscuring the left hemidiaphragm. This appears stable since 01/31/2016, and improved since 01/26/2016. IMPRESSION: Continued left lower lobe consolidation compatible with pneumonia, stable since 01/31/2016. No definite pleural effusion. Electronically Signed   By: Genevie Ann M.D.   On: 02/02/2016 12:50   Dg Chest Port 1 View  02/01/2016  CLINICAL DATA:  Patient with history of pneumonia. Respiratory distress. EXAM: PORTABLE CHEST 1 VIEW COMPARISON:  Chest radiograph 01/31/2016. FINDINGS: Monitoring leads overlie the patient. Stable cardiomegaly. Unchanged mid lower lung perihilar interstitial pulmonary opacities. More focal consolidation left lower lung. Small left pleural effusion. No definite pneumothorax. IMPRESSION: Mid and lower lung perihilar interstitial opacities may represent pulmonary edema in the appropriate clinical setting. More focal consolidation left  lower lung may represent atelectasis or infection. Small left pleural effusion. Electronically Signed   By: Lovey Newcomer M.D.   On: 02/01/2016 14:57    Assessment:  1 AKI, prob hemodynamically mediated with hx of a fall and enalapril on board, exacerbated by hypovolemia 2 Hypernatremia due to free water deficit prob due to diuretic Tx and reduced PO intake Plan: 1 D5W infusion 2 Hold metoprolol, flomax and isordil due to  Low BP 3 renal ultrasound 4 SPEP  Bryar Dahms C 02/02/2016, 3:04 PM

## 2016-02-02 NOTE — Progress Notes (Signed)
BiPAP on standby placed patient on 6LNC SATS 91%

## 2016-02-02 NOTE — Progress Notes (Signed)
PT Cancellation Note  Patient Details Name: Jared SalvageRonald [name] MRN: [id number] DOB: May 13, 1941   Cancelled Treatment:    Reason Eval/Treat Not Completed: Patient not medically ready.  Noted that pt has been on and off of BiPap and 6L O2.  Spoke w/ RN who reports pt currently on 10L O2.  Will hold PT until pt more medically appropriate to participate in exertional activity.  Encarnacion ChuAshley Farid Grigorian PT, DPT  Pager: [phone] Phone: [phone] 02/02/2016, 2:33 PM

## 2016-02-02 NOTE — Progress Notes (Addendum)
Pharmacy Antibiotic Note  Jared [name] is a 75 y.o. male admitted on 01/24/2016 with acute respiratory failure and AKI. Pt on Day #3 of Aztreonam and Vancomycin for HCAP. SCr continues to worsen - now up to 3.22. Nephrology following.  Random vancomycin level drawn tonight 38 mcg/ml (SUPRAtherapeutic for goal 15-20)  Plan: Continue to hold vancomycin Consider checking random vancomycin level 6/21 a.m. if SCr not improved 6/20 Continu Aztreonam 1g IV q8h Will continue to follow renal function, culture results, LOT, and antibiotic de-escalation plans    Height: 5\' 10"  (177.8 cm) Weight: 217 lb 6 oz (98.6 kg) IBW/kg (Calculated) : 73  Temp (24hrs), Avg:99.6 F (37.6 C), Min:98.5 F (36.9 C), Max:102.3 F (39.1 C)   Recent Labs Lab 01/27/16 0215 01/28/16 0515  01/30/16 1819 01/31/16 0450 01/31/16 1824 02/01/16 0537 02/02/16 0746 02/02/16 2230  WBC 10.3 9.6  --   --   --   --   --  19.5*  --   CREATININE 1.98* 1.80*  < > 2.07* 1.90* 2.07* 2.19* 3.22*  --   VANCORANDOM  --   --   --   --   --   --   --   --  38  < > = values in this interval not displayed.  Estimated Creatinine Clearance: 24.4 mL/min (by C-G formula based on Cr of 3.22).    Allergies  Allergen Reactions  . Penicillins Hives    Early childhood reaction - no other information available    Antimicrobials this admission: Vanc 6/10 x 1; restart 6/17 >> Primaxin 6/10 >> 6/16 Azactam 6/17 >>  Dose adjustments this admission: n/a  Microbiology results: 6/10 BCx: ngf 6/11 UC: ngf 6/11 mrsa pcr: neg 6/17 BCx: ngtd   Jared [name], PharmD, BCPS Clinical pharmacist, pager [phone] 02/02/2016 11:27 PM

## 2016-02-02 NOTE — Care Management Important Message (Signed)
Important Message  Patient Details  Name: Marrian SalvageRonald Quilling MRN: [id number] Date of Birth: 1941/03/07   Medicare Important Message Given:  Yes    Kyla BalzarineShealy, Kendalynn Wideman Abena 02/02/2016, 10:52 AM

## 2016-02-02 NOTE — Progress Notes (Signed)
Pharmacy Antibiotic Note  Jared SalvageRonald Witczak is a 75 y.o. male admitted on 01/24/2016 with acute respiratory failure and AKI. The patient just recently finished Primaxin for ?UTI/PNA and with fevers this evening and continues SOB. CXR shows possible PNA and pharmacy has been consulted to dose Vancomycin along with Azactam per MD - D#3. Fevers continuing, wbc 19.5, SCr worsening, 2.19>>3.22, CrCl~24.  Plan: - Hold vanc, check a VR prior to tonight's scheduled dose to assess current dose with worsening renal function - Decrease Aztreonam 1g IV q8h - Will continue to follow renal function, culture results, LOT, and antibiotic de-escalation plans    Height: 5\' 10"  (177.8 cm) Weight: 217 lb 6 oz (98.6 kg) IBW/kg (Calculated) : 73  Temp (24hrs), Avg:100.4 F (38 C), Min:98.5 F (36.9 C), Max:103.3 F (39.6 C)   Recent Labs Lab 01/27/16 0215 01/28/16 0515  01/30/16 1819 01/31/16 0450 01/31/16 1824 02/01/16 0537 02/02/16 0746  WBC 10.3 9.6  --   --   --   --   --  19.5*  CREATININE 1.98* 1.80*  < > 2.07* 1.90* 2.07* 2.19* 3.22*  < > = values in this interval not displayed.  Estimated Creatinine Clearance: 24.4 mL/min (by C-G formula based on Cr of 3.22).    Allergies  Allergen Reactions  . Penicillins Hives    Early childhood reaction - no other information available    Antimicrobials this admission: Vanc 6/10 x 1; restart 6/17 >> Primaxin 6/10 >> 6/16 Azactam 6/17 >>  Dose adjustments this admission: n/a  Microbiology results: 6/10 BCx: ngf 6/11 UC: ngf 6/11 mrsa pcr: neg 6/17 BCx: ngtd   Babs BertinHaley Alithia Zavaleta, PharmD, BCPS Clinical Pharmacist Pager [phone] 02/02/2016 12:26 PM

## 2016-02-03 ENCOUNTER — Inpatient Hospital Stay (HOSPITAL_COMMUNITY): Payer: Medicare Other

## 2016-02-03 LAB — GLUCOSE, CAPILLARY
GLUCOSE-CAPILLARY: 191 mg/dL — AB (ref 65–99)
Glucose-Capillary: 278 mg/dL — ABNORMAL HIGH (ref 65–99)
Glucose-Capillary: 267 mg/dL — ABNORMAL HIGH (ref 65–99)
Glucose-Capillary: 239 mg/dL — ABNORMAL HIGH (ref 65–99)

## 2016-02-03 LAB — BASIC METABOLIC PANEL
Anion gap: 7 (ref 5–15)
BUN: 62 mg/dL — AB (ref 6–20)
CALCIUM: 7.4 mg/dL — AB (ref 8.9–10.3)
CO2: 22 mmol/L (ref 22–32)
CREATININE: 3.96 mg/dL — AB (ref 0.61–1.24)
Chloride: 127 mmol/L — ABNORMAL HIGH (ref 101–111)
GFR calc Af Amer: 16 mL/min — ABNORMAL LOW (ref >60)
GFR, EST NON AFRICAN AMERICAN: 14 mL/min — AB (ref >60)
Glucose, Bld: 216 mg/dL — ABNORMAL HIGH (ref 65–99)
Potassium: 4 mmol/L (ref 3.5–5.1)
SODIUM: 156 mmol/L — AB (ref 135–145)

## 2016-02-03 MED ORDER — INSULIN GLARGINE 100 UNIT/ML ~~LOC~~ SOLN: 10.0000 [IU] | Freq: Every day | SUBCUTANEOUS | Status: DC

## 2016-02-03 MED ORDER — RESOURCE THICKENUP CLEAR PO POWD
ORAL | Status: DC | PRN
  Filled 2016-02-03: qty 125

## 2016-02-03 MED ORDER — INSULIN GLARGINE 100 UNIT/ML ~~LOC~~ SOLN
15.0000 [IU] | Freq: Every day | SUBCUTANEOUS | Status: DC
  Administered 2016-02-04 – 2016-02-08 (×5): 15 [IU] via SUBCUTANEOUS
  Filled 2016-02-03 (×5): qty 0.15

## 2016-02-03 NOTE — Evaluation (Signed)
Clinical/Bedside Swallow Evaluation Patient Details  Name: Jared [name] MRN: [id number] Date of Birth: 01-06-1941  Today's Date: 02/03/2016 Time: SLP Start Time (ACUTE ONLY): 1350 SLP Stop Time (ACUTE ONLY): 1408 SLP Time Calculation (min) (ACUTE ONLY): 18 min  Past Medical History:  Past Medical History  Diagnosis Date  . High blood pressure   . GERD (gastroesophageal reflux disease)   . Bruises easily   . Anxiety   . Hearing loss   . Diabetes mellitus   . Stroke (HCC)   . Heart attack [facility])    Past Surgical History:  Past Surgical History  Procedure Laterality Date  . Gallbladder surgery     HPI:  75 yo male with hx dementia, DM, previous CVA with residual L sided weakness initially admitted to [facility] 6/7 after a fall at home with L femoral neck fx which was surgically repaired. Post op patient was unable to wean from vent. Pt had progressive renal failure, volume overload and was tx to [facility] 6/10 for ongoing care and renal eval. Intubated 6/10-6/12.  Swallow evaluation 6/13 with recs for dysphagia 1, thin liquids and anticipation of D/C on same diet. On 6/17 patient became febrile with possible aspiration pneumonia and on 6/18 became septic and went into acute hypoxic respiratory failure requiting NRB and transfer to stepdown unit. New orders for swallow evaluation 6/20 given concerns for aspiration.    Assessment / Plan / Recommendation Clinical Impression  Pt continues with high respiratory needs, currently on venturimask with RR reaching into low 30s, creating potential for aspiration.  Demonstrates intermittent cough after purees, consistent cough after consumption of nectar-thick liquids. Difficult to discern at bedside source of cough.  Recommend maintaining NPO except meds whole in puree; SLP will f/u next day to assess PO readiness and whether MBS may be indicated.  D/W pt's wife and daughter.    Aspiration Risk  Moderate aspiration risk    Diet Recommendation    continue NPO  Medication Administration: Whole meds with puree    Other  Recommendations Oral Care Recommendations: Oral care QID   Follow up Recommendations  Skilled Nursing facility    Frequency and Duration min 2x/week  1 week       Prognosis Prognosis for Safe Diet Advancement: Fair Barriers to Reach Goals: Cognitive deficits (baseline per family)      Swallow Study   General Date of Onset: 01/21/16 HPI: 75 yo male with hx dementia, DM, previous CVA with residual L sided weakness initially admitted [facility]7 after a fall at home with L femoral neck fx which was surgically repaired. Post op patient was unable to wean from vent. Pt had progressive renal failure, volume overload and was tx to [facility] 6/10 for ongoing care and renal eval. Intubated 6/10-6/12.  Swallow evaluation 6/13 with recs for dysphagia 1, thin liquids and anticipation of D/C on same diet. On 6/17 patient became febrile with possible aspiration pneumonia and on 6/18 became septic and went into acute hypoxic respiratory failure requiting NRB and transfer to stepdown unit. New orders for swallow evaluation 6/20 given concerns for aspiration.  Type of Study: Bedside Swallow Evaluation Temperature Spikes Noted: Yes Respiratory Status:  (90% Sp02 on venti mask) History of Recent Intubation: Yes Length of Intubations (days): 2 days Date extubated: 01/26/16 Behavior/Cognition: Alert;Pleasant mood;Confused Oral Cavity Assessment: Dry Oral Cavity - Dentition: Poor condition;Missing dentition Self-Feeding Abilities: Total assist Patient Positioning: Upright in bed Baseline Vocal Quality: Low vocal intensity Volitional Cough: Cognitively unable to  elicit Volitional Swallow: Able to elicit    Oral/Motor/Sensory Function Overall Oral Motor/Sensory Function: Within functional limits   Ice Chips Ice chips: Not tested   Thin Liquid Thin Liquid: Not tested    Nectar Thick Nectar Thick Liquid:  Impaired Presentation: Spoon Pharyngeal Phase Impairments: Cough - Immediate (coughing after 100% boluses)   Honey Thick Honey Thick Liquid: Not tested   Puree Puree: Impaired Presentation: Spoon Oral Phase Functional Implications: Prolonged oral transit Pharyngeal Phase Impairments: Cough - Delayed (<25% of time)   Solid   GO   Solid: Not tested       Sawyer Mentzer L. Samson Fredericouture, KentuckyMA CCC/SLP Pager [phone]  Blenda MountsCouture, Aayushi Solorzano Laurice 02/03/2016,2:16 PM

## 2016-02-03 NOTE — Progress Notes (Signed)
PT Cancellation Note  Patient Details Name: Jared [name] MRN: [id number] DOB: Sep 13, 1940   Cancelled Treatment:    Reason Eval/Treat Not Completed: Medical issues which prohibited therapy.  Pt continues to have difficulty maintaining O2 sats.  Is now on Venturi Mask.  Will continue to hold PT until pt more medically appropriate.   Encarnacion ChuAshley Abashian PT, DPT  Pager: [phone] Phone: [phone] 02/03/2016, 8:58 AM

## 2016-02-03 NOTE — Progress Notes (Signed)
PROGRESS NOTE                                                                                                                                                                                                             Patient Demographics:    Jared [name], is a 75 y.o. male, DOB - 1941-01-15, ZOX:[id number]  Admit date - 01/24/2016   Admitting Physician Lupita Leash, MD  Outpatient Primary MD for the patient is Feliciana Rossetti, MD  LOS - 10  Outpatient Specialists:not on file  No chief complaint on file.      Brief Narrative   75 year old male with history of CVA with residual left-sided weakness, dementia, diabetes mellitus admitted to [facility] on 6/7 after a fall at home with left femoral neck fracture which was surgically repaired. Postoperatively he was unable to be weaned from the vent and had progressive renal failure with volume overload and transferred to [facility] on 6/10 for ongoing care and renal evaluation. Cultures were negative. Received antibiotic for possible healthcare associated pneumonia and transferred to hospitalist service once extubated on 6/12.  transferred to hospitalist service and completed antibiotics on 6/16. However on 6/17 patient became febrile with possible aspiration pneumonia and on 6/18 became septic and went into acute hypoxic respiratory failure requiting NRB and transfer to stepdown unit.   Subjective:   Patient now on   Assessment  & Plan :    Sepsis with acute hypoxemic respiratory failure Secondary to acute pulmonary edema and possible healthcare associated pneumonia. Completed 7 days of antibiotics on 6/16. Patient again went into respiratory failure possibly due to aspiration pneumonia, required and I be followed by BiPAP and transfer to stepdown unit. Now on ventimask . Continue empiric vancomycin and aztreonam.    HCAP/aspiration pneumonia Placed on empiric  vancomycin and asked her in am. Follow cultures. Worsened leukocytosis. Nothing by mouth until cleared by swallow evaluation.    Acute kidney injury Worsened by hypovolemia. Was also receiving intermittent Lasix. Placed on D5W (unfortunately did not receive it since yesterday). Monitor strict I/O. Renal ultrasound shows medical renal disease.  Renal consult following. Appreciate recommendations.  Hypernatremia Suspected due to dehydration and receiving intermittent Lasix. Resumed D5 W. Blood pressure medications on hold due to blood pressure.   Diabetes mellitus with hyperglycemia Patient nothing by mouth. Will monitor on sliding  scale coverage only  Protein calorie malnutrition added supplements  Left Femoral neck fracture Surgically repaired on 6/7. Has not required pain medication. Heparin for DVT prophylaxis. As per ordered from note from [facility] [place] recommend weightbearing with lateral and anterior hip precautions. PT recommend skilled nursing facility.  CVA with right hemiparesis Continue Aggrenox and statin (holding as patient is nothing by mouth)  ? Vascular dementia Resume namenda and excelon patch  ? Depression  resume seroquel    Code Status :DO NOT RESUSCITATE  Family Communication  : We will update wife. Disposition Plan  : Continue stepdown monitoring. Condition guarded.  Barriers For Discharge :Acute hypoxic respiratory failure, pneumonia,  Hypernatremia, acute kidney injury  Consults  :   PC CM Renal  Procedures  :  Intubation 2-D echo Renal ultrasound  DVT Prophylaxis  :  Subcutaneous heparin  Lab Results  Component Value Date   PLT 125* 02/02/2016    Antibiotics  :   Anti-infectives    Start     Dose/Rate Route Frequency Ordered Stop   02/02/16 1400  aztreonam (AZACTAM) 1 g in dextrose 5 % 50 mL IVPB     1 g 100 mL/hr over 30 Minutes Intravenous Every 8 hours 02/02/16 1229 02/08/16 2159   02/01/16 2300  vancomycin (VANCOCIN) 1,250 mg in  sodium chloride 0.9 % 250 mL IVPB  Status:  Discontinued     1,250 mg 166.7 mL/hr over 90 Minutes Intravenous Every 24 hours 01/31/16 2133 02/02/16 1229   01/31/16 2200  aztreonam (AZACTAM) 2 g in dextrose 5 % 50 mL IVPB  Status:  Discontinued     2 g 100 mL/hr over 30 Minutes Intravenous Every 8 hours 01/31/16 2119 02/02/16 1229   01/31/16 2200  vancomycin (VANCOCIN) 1,750 mg in sodium chloride 0.9 % 500 mL IVPB     1,750 mg 250 mL/hr over 120 Minutes Intravenous  Once 01/31/16 2133 02/01/16 0113   01/27/16 1800  imipenem-cilastatin (PRIMAXIN) 500 mg in sodium chloride 0.9 % 100 mL IVPB  Status:  Discontinued     500 mg 200 mL/hr over 30 Minutes Intravenous Every 8 hours 01/27/16 1206 01/30/16 1616   01/26/16 1200  imipenem-cilastatin (PRIMAXIN) 250 mg in sodium chloride 0.9 % 100 mL IVPB  Status:  Discontinued     250 mg 200 mL/hr over 30 Minutes Intravenous Every 6 hours 01/26/16 1007 01/27/16 1206   01/25/16 0500  imipenem-cilastatin (PRIMAXIN) 250 mg in sodium chloride 0.9 % 100 mL IVPB  Status:  Discontinued     250 mg 200 mL/hr over 30 Minutes Intravenous Every 12 hours 01/24/16 1817 01/26/16 1007   01/24/16 1700  vancomycin (VANCOCIN) 2,000 mg in sodium chloride 0.9 % 500 mL IVPB     2,000 mg 250 mL/hr over 120 Minutes Intravenous  Once 01/24/16 1628 01/24/16 2038   01/24/16 1700  imipenem-cilastatin (PRIMAXIN) 500 mg in sodium chloride 0.9 % 100 mL IVPB     500 mg 200 mL/hr over 30 Minutes Intravenous  Once 01/24/16 1628 01/24/16 1821        Objective:   Filed Vitals:   02/03/16 0810 02/03/16 0832 02/03/16 0837 02/03/16 1126  BP: 136/52   128/54  Pulse: 77  81 82  Temp:    99.7 F (37.6 C)  TempSrc:    Axillary  Resp: 31  32 31  Height:      Weight:      SpO2: 98% 93% 94% 90%    Wt Readings from Last 3  Encounters:  02/03/16 98.1 kg (216 lb 4.3 oz)     Intake/Output Summary (Last 24 hours) at 02/03/16 1243 Last data filed at 02/03/16 0521  Gross per 24 hour    Intake    340 ml  Output    450 ml  Net   -110 ml     Physical Exam  Gen:On facemask, awake and answering to questions HEENT: No pallor, dry mucosa Chest:Coarse breath sounds bilaterally  CVS: N S1&S2, 3/6 systolic murmur, no rubs or gallop GI: soft, NT, ND, BS+ Musculoskeletal: warm, no edema, dressing over left hip. CNS: Alert and awake, answering simple questions    Data Review:    CBC  Recent Labs Lab 01/28/16 0515 02/02/16 0746  WBC 9.6 19.5*  HGB 9.8* 9.4*  HCT 32.8* 32.2*  PLT 205 125*  MCV 98.2 99.7  MCH 29.3 29.1  MCHC 29.9* 29.2*  RDW 16.7* 17.9*  LYMPHSABS 1.1  --   MONOABS 0.7  --   EOSABS 0.1  --   BASOSABS 0.0  --     Chemistries   Recent Labs Lab 01/31/16 0450 01/31/16 1824 02/01/16 0537 02/02/16 0746 02/03/16 0858  NA 155* 154* 155* 154* 156*  K 3.9 3.5 3.8 3.9 4.0  CL 125* 125* 124* 125* 127*  CO2 23 21* 23 20* 22  GLUCOSE 270* 159* 236* 158* 216*  BUN 33* 29* 29* 48* 62*  CREATININE 1.90* 2.07* 2.19* 3.22* 3.96*  CALCIUM 7.7* 7.7* 7.5* 7.4* 7.4*   ------------------------------------------------------------------------------------------------------------------ No results for input(s): CHOL, HDL, LDLCALC, TRIG, CHOLHDL, LDLDIRECT in the last 72 hours.  No results found for: HGBA1C ------------------------------------------------------------------------------------------------------------------ No results for input(s): TSH, T4TOTAL, T3FREE, THYROIDAB in the last 72 hours.  Invalid input(s): FREET3 ------------------------------------------------------------------------------------------------------------------ No results for input(s): VITAMINB12, FOLATE, FERRITIN, TIBC, IRON, RETICCTPCT in the last 72 hours.  Coagulation profile No results for input(s): INR, PROTIME in the last 168 hours.  No results for input(s): DDIMER in the last 72 hours.  Cardiac Enzymes No results for input(s): CKMB, TROPONINI, MYOGLOBIN in the last  168 hours.  Invalid input(s): CK ------------------------------------------------------------------------------------------------------------------    Component Value Date/Time   BNP 243.6* 01/24/2016 1633    Inpatient Medications  Scheduled Meds: . antiseptic oral rinse  7 mL Mouth Rinse BID  . aztreonam  1 g Intravenous Q8H  . citalopram  20 mg Oral Daily  . dipyridamole-aspirin  1 capsule Oral BID  . feeding supplement (GLUCERNA SHAKE)  237 mL Oral TID WC  . heparin  5,000 Units Subcutaneous Q8H  . insulin aspart  0-15 Units Subcutaneous TID WC  . insulin aspart  0-5 Units Subcutaneous QHS  . insulin glargine  30 Units Subcutaneous Daily  . ipratropium-albuterol  3 mL Nebulization TID  . isosorbide mononitrate  30 mg Oral Daily  . memantine  28 mg Oral Q supper  . niacin  500 mg Oral QHS  . pantoprazole  40 mg Oral Daily  . pravastatin  40 mg Oral QHS  . QUEtiapine  25 mg Oral Q1200  . Rivastigmine  13.3 mg Transdermal Daily   Continuous Infusions: . dextrose 125 mL/hr at 02/03/16 0822   PRN Meds:.acetaminophen (TYLENOL) oral liquid 160 mg/5 mL, acetaminophen, ipratropium-albuterol, RESOURCE THICKENUP CLEAR  Micro Results Recent Results (from the past 240 hour(s))  Culture, blood (routine x 2)     Status: None   Collection Time: 01/24/16  4:33 PM  Result Value Ref Range Status   Specimen Description BLOOD BLOOD LEFT HAND  Final  Special Requests IN PEDIATRIC BOTTLE 3CC  Final   Culture NO GROWTH 5 DAYS  Final   Report Status 01/29/2016 FINAL  Final  Culture, blood (routine x 2)     Status: None   Collection Time: 01/24/16  4:41 PM  Result Value Ref Range Status   Specimen Description BLOOD BLOOD RIGHT HAND  Final   Special Requests BOTTLES DRAWN AEROBIC AND ANAEROBIC 5CC  Final   Culture NO GROWTH 5 DAYS  Final   Report Status 01/29/2016 FINAL  Final  MRSA PCR Screening     Status: None   Collection Time: 01/25/16  9:23 AM  Result Value Ref Range Status    MRSA by PCR NEGATIVE NEGATIVE Final    Comment:        The GeneXpert MRSA Assay (FDA approved for NASAL specimens only), is one component of a comprehensive MRSA colonization surveillance program. It is not intended to diagnose MRSA infection nor to guide or monitor treatment for MRSA infections.   Culture, Urine     Status: None   Collection Time: 01/25/16  4:24 PM  Result Value Ref Range Status   Specimen Description URINE, CATHETERIZED  Final   Special Requests NONE  Final   Culture NO GROWTH  Final   Report Status 01/26/2016 FINAL  Final  Culture, blood (routine x 2)     Status: None (Preliminary result)   Collection Time: 01/31/16 10:15 AM  Result Value Ref Range Status   Specimen Description BLOOD LEFT HAND  Final   Special Requests BOTTLES DRAWN AEROBIC ONLY 7CC  Final   Culture NO GROWTH 3 DAYS  Final   Report Status PENDING  Incomplete  Culture, blood (routine x 2)     Status: None (Preliminary result)   Collection Time: 01/31/16 10:20 AM  Result Value Ref Range Status   Specimen Description BLOOD RIGHT ANTECUBITAL  Final   Special Requests BOTTLES DRAWN AEROBIC AND ANAEROBIC 5CC  Final   Culture NO GROWTH 3 DAYS  Final   Report Status PENDING  Incomplete  Culture, Urine     Status: None   Collection Time: 01/31/16 11:45 AM  Result Value Ref Range Status   Specimen Description URINE, CLEAN CATCH  Final   Special Requests Normal  Final   Culture NO GROWTH  Final   Report Status 02/01/2016 FINAL  Final    Radiology Reports US Renal  02/03/2016  CLINICAL DATA:  Acute renal insufficiency; history of diabetes and hypertension. EXAM: RENAL / URINARY TRACT ULTRASOUND COMPLETE COMPARISON:  None in PACs FINDINGS: Right Kidney: Length: 13.1 cm. The renal cortical echotexture is increased as compared to the liver. There is no focal mass. There is no hydronephrosis. Left Kidney: Length: 12.9 cm. The renal cortical echotexture is mildly increased similar to that on the right.  There is no hydronephrosis. Bladder: The partially distended urinary bladder is normal. IMPRESSION: Increased renal cortical echotexture consistent with medical renal disease. There is no hydronephrosis. Electronically Signed   By: David  [place] M.D.   On: 02/03/2016 12:01   Dg Chest Port 1 View  02/02/2016  CLINICAL DATA:  75 year old male with respiratory distress and chest congestion. Initial encounter. EXAM: PORTABLE CHEST 1 VIEW COMPARISON:  02/01/2016 and earlier. FINDINGS: Portable AP semi upright view at 1318 hours. The patient is mildly rotated to the right. Mediastinal contours are stable and within normal limits. No pneumothorax. No pulmonary edema or definite effusion. Dense retrocardiac opacity persists with suggestion of air bronchograms, and is  mostly obscuring the left hemidiaphragm. This appears stable since 01/31/2016, and improved since 01/26/2016. IMPRESSION: Continued left lower lobe consolidation compatible with pneumonia, stable since 01/31/2016. No definite pleural effusion. Electronically Signed   By: Odessa Fleming M.D.   On: 02/02/2016 12:50   Dg Chest Port 1 View  02/01/2016  CLINICAL DATA:  Patient with history of pneumonia. Respiratory distress. EXAM: PORTABLE CHEST 1 VIEW COMPARISON:  Chest radiograph 01/31/2016. FINDINGS: Monitoring leads overlie the patient. Stable cardiomegaly. Unchanged mid lower lung perihilar interstitial pulmonary opacities. More focal consolidation left lower lung. Small left pleural effusion. No definite pneumothorax. IMPRESSION: Mid and lower lung perihilar interstitial opacities may represent pulmonary edema in the appropriate clinical setting. More focal consolidation left lower lung may represent atelectasis or infection. Small left pleural effusion. Electronically Signed   By: Annia Belt M.D.   On: 02/01/2016 14:57   Dg Chest Port 1 View  01/31/2016  CLINICAL DATA:  Fever.  Shortness of breath. EXAM: PORTABLE CHEST 1 VIEW COMPARISON:  01/26/2016 chest  radiograph. FINDINGS: Stable cardiomediastinal silhouette with mild cardiomegaly. No pneumothorax. Stable small left pleural effusion. Stable patchy opacities at the left greater than right lung bases. Stable diffuse linear parahilar interstitial opacities suggesting mild pulmonary edema. IMPRESSION: 1. Stable mild congestive heart failure and small left pleural effusion . 2. Stable patchy bibasilar lung opacities, left greater than right, favor atelectasis, cannot exclude a component of pneumonia or aspiration. Electronically Signed   By: Delbert Phenix M.D.   On: 01/31/2016 11:55   Dg Chest Port 1 View  01/26/2016  CLINICAL DATA:  Respiratory failure. EXAM: PORTABLE CHEST 1 VIEW COMPARISON:  01/25/2016. FINDINGS: Endotracheal and NG tube in stable position. Cardiomegaly with pulmonary vascular congestion and interstitial prominence along the left side pleural effusion no suggesting congestive heart failure. Persistent infiltrate next right mid lung again noted. Continued follow-up exams again suggested to demonstrate clearing. No pneumothorax. IMPRESSION: 1. Lines and tubes in stable position. 2. Persistent infiltrate right mid lung again noted. Continued follow-up chest x-rays to demonstrate clearing suggested. 3. Cardiomegaly with new onset pulmonary venous congestion and bilateral interstitial prominence along with left-sided pleural effusion. Findings consistent with congestive heart failure. Electronically Signed   By: Maisie Fus  Register   On: 01/26/2016 06:52   Dg Chest Port 1 View  01/25/2016  CLINICAL DATA:  Fever. EXAM: PORTABLE CHEST 1 VIEW COMPARISON:  January 24, 2016 FINDINGS: Stable support apparatus. No pneumothorax. The focal opacity in the medial right lung base has resolved. Minimal opacity in the bases may simply represent atelectasis. No other changes. IMPRESSION: Resolution of the focal opacity in the medial right lung base. Minimal residual opacity in the bases, likely atelectasis.  Electronically Signed   By: Gerome Sam III M.D   On: 01/25/2016 08:31   Dg Chest Port 1 View  01/24/2016  CLINICAL DATA:  Respiratory failure. EXAM: PORTABLE CHEST 1 VIEW COMPARISON:  None. FINDINGS: Endotracheal tube in satisfactory position. Nasogastric tube extending into the stomach. Mildly enlarged cardiac silhouette. Patchy opacity in the right mid to lower lung zone, medially. There is also airspace opacity in the left lower lobe. Possible small left pleural effusion. Diffuse osteopenia. IMPRESSION: 1. Probable pneumonia in the right mid to lower lung zone, medially. A mass cannot be excluded and this will require follow-up. 2. Left lower lobe atelectasis or pneumonia. 3. Possible small left pleural effusion. 4. Mild cardiomegaly. Electronically Signed   By: Beckie Salts M.D.   On: 01/24/2016 17:08  Time Spent in minutes  35   Eddie North M.D on 02/03/2016 at 12:43 PM  Between 7am to 7pm - Pager - [phone]  After 7pm go to [email or web address] - password [email or web address]  [facility] -  Office  [phone]

## 2016-02-03 NOTE — Progress Notes (Signed)
Assessment:  1 AKI, prob hemodynamically mediated with hx of a fall and enalapril on board, exacerbated by hypovolemia-worse 2 Hypernatremia due to free water deficit prob due to diuretic Tx and reduced PO intake Plan: 1 D5W infusion to begin as ordered yesterday, RN staff notified 2 Cont.  hold metoprolol, flomax and isordil due to Low BP 3 renal ultrasound 4 SPEP pending  Subjective: Interval History: Pt did not get the prescribed D5W yesterday or this morning  Objective: Vital signs in last 24 hours: Temp:  [98.5 F (36.9 [name])-102.3 F (39.1 [name])] 98.8 F (37.1 [name]) (06/20 0725) Pulse Rate:  [71-241] 71 (06/20 0519) Resp:  [25-45] 30 (06/20 0725) BP: (97-149)/(17-119) 125/57 mmHg (06/20 0725) SpO2:  [84 %-100 %] 96 % (06/20 0725) FiO2 (%):  [4 %-60 %] 30 % (06/20 0331) Weight:  [98.1 kg (216 lb 4.3 oz)] 98.1 kg (216 lb 4.3 oz) (06/20 0500) Weight change: -2.9 kg (-6 lb 6.3 oz)  Intake/Output from previous day: 06/19 0701 - 06/20 0700 In: 340 [P.O.:240; IV Piggyback:100] Out: 450 [Urine:450] Intake/Output this shift:    General appearance: alert and cooperative Head: Normocephalic, without obvious abnormality, atraumatic, wearing CPAP Extremities: extremities normal, atraumatic, no cyanosis or edema  Lab Results:  Recent Labs  02/02/16 0746  WBC 19.5*  HGB 9.4*  HCT 32.2*  PLT 125*   BMET:  Recent Labs  02/01/16 0537 02/02/16 0746  NA 155* 154*  K 3.8 3.9  CL 124* 125*  CO2 23 20*  GLUCOSE 236* 158*  BUN 29* 48*  CREATININE 2.19* 3.22*  CALCIUM 7.5* 7.4*   No results for input(s): PTH in the last 72 hours. Iron Studies: No results for input(s): IRON, TIBC, TRANSFERRIN, FERRITIN in the last 72 hours. Studies/Results: Dg Chest Port 1 View  02/02/2016  CLINICAL DATA:  75 year old male with respiratory distress and chest congestion. Initial encounter. EXAM: PORTABLE CHEST 1 VIEW COMPARISON:  02/01/2016 and earlier. FINDINGS: Portable AP semi upright view at  1318 hours. The patient is mildly rotated to the right. Mediastinal contours are stable and within normal limits. No pneumothorax. No pulmonary edema or definite effusion. Dense retrocardiac opacity persists with suggestion of air bronchograms, and is mostly obscuring the left hemidiaphragm. This appears stable since 01/31/2016, and improved since 01/26/2016. IMPRESSION: Continued left lower lobe consolidation compatible with pneumonia, stable since 01/31/2016. No definite pleural effusion. Electronically Signed   By: Odessa FlemingH  Hall M.D.   On: 02/02/2016 12:50   Dg Chest Port 1 View  02/01/2016  CLINICAL DATA:  Patient with history of pneumonia. Respiratory distress. EXAM: PORTABLE CHEST 1 VIEW COMPARISON:  Chest radiograph 01/31/2016. FINDINGS: Monitoring leads overlie the patient. Stable cardiomegaly. Unchanged mid lower lung perihilar interstitial pulmonary opacities. More focal consolidation left lower lung. Small left pleural effusion. No definite pneumothorax. IMPRESSION: Mid and lower lung perihilar interstitial opacities may represent pulmonary edema in the appropriate clinical setting. More focal consolidation left lower lung may represent atelectasis or infection. Small left pleural effusion. Electronically Signed   By: Annia Beltrew  Davis M.D.   On: 02/01/2016 14:57    Scheduled: . antiseptic oral rinse  7 mL Mouth Rinse BID  . aztreonam  1 g Intravenous Q8H  . citalopram  20 mg Oral Daily  . dipyridamole-aspirin  1 capsule Oral BID  . feeding supplement (GLUCERNA SHAKE)  237 mL Oral TID WC  . heparin  5,000 Units Subcutaneous Q8H  . insulin aspart  0-15 Units Subcutaneous TID WC  . insulin aspart  0-5 Units Subcutaneous QHS  . insulin glargine  30 Units Subcutaneous Daily  . ipratropium-albuterol  3 mL Nebulization TID  . isosorbide mononitrate  30 mg Oral Daily  . memantine  28 mg Oral Q supper  . niacin  500 mg Oral QHS  . pantoprazole  40 mg Oral Daily  . pravastatin  40 mg Oral QHS  .  QUEtiapine  25 mg Oral Q1200  . Rivastigmine  13.3 mg Transdermal Daily      LOS: 10 days   Jared [name] 02/03/2016,8:05 AM

## 2016-02-04 ENCOUNTER — Inpatient Hospital Stay (HOSPITAL_COMMUNITY): Payer: Medicare Other

## 2016-02-04 DIAGNOSIS — E870 Hyperosmolality and hypernatremia: Secondary | ICD-10-CM

## 2016-02-04 DIAGNOSIS — Z794 Long term (current) use of insulin: Secondary | ICD-10-CM

## 2016-02-04 DIAGNOSIS — J810 Acute pulmonary edema: Secondary | ICD-10-CM

## 2016-02-04 DIAGNOSIS — F0150 Vascular dementia without behavioral disturbance: Secondary | ICD-10-CM

## 2016-02-04 DIAGNOSIS — E0865 Diabetes mellitus due to underlying condition with hyperglycemia: Secondary | ICD-10-CM

## 2016-02-04 LAB — GLUCOSE, CAPILLARY
GLUCOSE-CAPILLARY: 211 mg/dL — AB (ref 65–99)
Glucose-Capillary: 352 mg/dL — ABNORMAL HIGH (ref 65–99)
Glucose-Capillary: 279 mg/dL — ABNORMAL HIGH (ref 65–99)
Glucose-Capillary: 198 mg/dL — ABNORMAL HIGH (ref 65–99)

## 2016-02-04 LAB — BASIC METABOLIC PANEL
ANION GAP: 8 (ref 5–15)
BUN: 50 mg/dL — AB (ref 6–20)
CHLORIDE: 122 mmol/L — AB (ref 101–111)
CO2: 21 mmol/L — ABNORMAL LOW (ref 22–32)
Calcium: 7.3 mg/dL — ABNORMAL LOW (ref 8.9–10.3)
Creatinine, Ser: 3.18 mg/dL — ABNORMAL HIGH (ref 0.61–1.24)
GFR, EST AFRICAN AMERICAN: 21 mL/min — AB (ref >60)
GFR, EST NON AFRICAN AMERICAN: 18 mL/min — AB (ref >60)
Glucose, Bld: 329 mg/dL — ABNORMAL HIGH (ref 65–99)
Potassium: 3.7 mmol/L (ref 3.5–5.1)
SODIUM: 151 mmol/L — AB (ref 135–145)

## 2016-02-04 LAB — CBC
HEMATOCRIT: 30.2 % — AB (ref 39.0–52.0)
Hemoglobin: 9.1 g/dL — ABNORMAL LOW (ref 13.0–17.0)
MCH: 29.4 pg (ref 26.0–34.0)
MCHC: 30.1 g/dL (ref 30.0–36.0)
MCV: 97.7 fL (ref 78.0–100.0)
Platelets: 165 10*3/uL (ref 150–400)
RBC: 3.09 MIL/uL — AB (ref 4.22–5.81)
RDW: 17.7 % — AB (ref 11.5–15.5)
WBC: 12.5 10*3/uL — AB (ref 4.0–10.5)

## 2016-02-04 LAB — PROTEIN ELECTROPHORESIS, SERUM
A/G Ratio: 0.6 — ABNORMAL LOW (ref 0.7–1.7)
ALBUMIN ELP: 1.9 g/dL — AB (ref 2.9–4.4)
Alpha-1-Globulin: 0.4 g/dL (ref 0.0–0.4)
Alpha-2-Globulin: 1.1 g/dL — ABNORMAL HIGH (ref 0.4–1.0)
Beta Globulin: 0.6 g/dL — ABNORMAL LOW (ref 0.7–1.3)
GAMMA GLOBULIN: 1.2 g/dL (ref 0.4–1.8)
Globulin, Total: 3.3 g/dL (ref 2.2–3.9)
Total Protein ELP: 5.2 g/dL — ABNORMAL LOW (ref 6.0–8.5)

## 2016-02-04 LAB — VANCOMYCIN, RANDOM: Vancomycin Rm: 21 ug/mL

## 2016-02-04 MED ORDER — SODIUM CHLORIDE 0.9 % IN NEBU
3.0000 mL | INHALATION_SOLUTION | Freq: Three times a day (TID) | RESPIRATORY_TRACT | Status: DC
Start: 2016-02-04 — End: 2016-02-11
  Administered 2016-02-04 – 2016-02-11 (×18): 3 mL via RESPIRATORY_TRACT
  Filled 2016-02-04 (×25): qty 3

## 2016-02-04 MED ORDER — NITROGLYCERIN 0.2 MG/HR TD PT24
0.2000 mg | MEDICATED_PATCH | Freq: Every day | TRANSDERMAL | Status: DC
  Administered 2016-02-04 – 2016-02-10 (×7): 0.2 mg via TRANSDERMAL
  Filled 2016-02-04 (×8): qty 1

## 2016-02-04 MED ORDER — INSULIN ASPART 100 UNIT/ML ~~LOC~~ SOLN
0.0000 [IU] | SUBCUTANEOUS | Status: DC
  Administered 2016-02-04: 3 [IU] via SUBCUTANEOUS
  Administered 2016-02-04: 8 [IU] via SUBCUTANEOUS
  Administered 2016-02-04: 5 [IU] via SUBCUTANEOUS
  Administered 2016-02-04: 15 [IU] via SUBCUTANEOUS
  Administered 2016-02-05: 5 [IU] via SUBCUTANEOUS
  Administered 2016-02-05: 3 [IU] via SUBCUTANEOUS
  Administered 2016-02-05: 5 [IU] via SUBCUTANEOUS
  Administered 2016-02-05: 3 [IU] via SUBCUTANEOUS
  Administered 2016-02-05 (×2): 5 [IU] via SUBCUTANEOUS
  Administered 2016-02-06 (×3): 2 [IU] via SUBCUTANEOUS
  Administered 2016-02-06 (×2): 3 [IU] via SUBCUTANEOUS
  Administered 2016-02-06: 2 [IU] via SUBCUTANEOUS
  Administered 2016-02-07: 11 [IU] via SUBCUTANEOUS
  Administered 2016-02-07 (×2): 5 [IU] via SUBCUTANEOUS
  Administered 2016-02-07: 8 [IU] via SUBCUTANEOUS
  Administered 2016-02-07: 5 [IU] via SUBCUTANEOUS
  Administered 2016-02-07: 11 [IU] via SUBCUTANEOUS
  Administered 2016-02-08: 8 [IU] via SUBCUTANEOUS
  Administered 2016-02-08: 5 [IU] via SUBCUTANEOUS
  Administered 2016-02-08: 8 [IU] via SUBCUTANEOUS
  Administered 2016-02-08: 5 [IU] via SUBCUTANEOUS
  Administered 2016-02-08: 11 [IU] via SUBCUTANEOUS
  Administered 2016-02-08: 5 [IU] via SUBCUTANEOUS
  Administered 2016-02-09: 3 [IU] via SUBCUTANEOUS
  Administered 2016-02-09: 5 [IU] via SUBCUTANEOUS
  Administered 2016-02-09: 3 [IU] via SUBCUTANEOUS
  Administered 2016-02-09: 5 [IU] via SUBCUTANEOUS
  Administered 2016-02-09: 8 [IU] via SUBCUTANEOUS
  Administered 2016-02-09 (×2): 5 [IU] via SUBCUTANEOUS
  Administered 2016-02-10: 3 [IU] via SUBCUTANEOUS
  Administered 2016-02-10 (×2): 5 [IU] via SUBCUTANEOUS
  Administered 2016-02-10: 8 [IU] via SUBCUTANEOUS
  Administered 2016-02-11: 2 [IU] via SUBCUTANEOUS

## 2016-02-04 MED ORDER — ASPIRIN 300 MG RE SUPP
300.0000 mg | Freq: Every day | RECTAL | Status: DC
  Administered 2016-02-04 – 2016-02-07 (×4): 300 mg via RECTAL
  Filled 2016-02-04 (×4): qty 1

## 2016-02-04 MED ORDER — FUROSEMIDE 10 MG/ML IJ SOLN
160.0000 mg | Freq: Once | INTRAMUSCULAR | Status: AC
  Administered 2016-02-04: 160 mg via INTRAVENOUS
  Filled 2016-02-04: qty 16

## 2016-02-04 MED ORDER — IPRATROPIUM-ALBUTEROL 0.5-2.5 (3) MG/3ML IN SOLN
3.0000 mL | Freq: Four times a day (QID) | RESPIRATORY_TRACT | Status: DC
  Administered 2016-02-04 – 2016-02-11 (×28): 3 mL via RESPIRATORY_TRACT
  Filled 2016-02-04 (×29): qty 3

## 2016-02-04 MED ORDER — FAMOTIDINE IN NACL 20-0.9 MG/50ML-% IV SOLN
20.0000 mg | Freq: Two times a day (BID) | INTRAVENOUS | Status: DC
  Administered 2016-02-04 – 2016-02-06 (×6): 20 mg via INTRAVENOUS
  Filled 2016-02-04 (×6): qty 50

## 2016-02-04 MED ORDER — VANCOMYCIN HCL 10 G IV SOLR
2000.0000 mg | INTRAVENOUS | Status: DC
  Administered 2016-02-04: 2000 mg via INTRAVENOUS
  Filled 2016-02-04: qty 2000

## 2016-02-04 NOTE — Progress Notes (Signed)
Assessment:  1 AKI- sl improved, prob hemodynamically mediated with hx of a fall and enalapril on board, exacerbated by hypovolemia-worse 2 Hypernatremia, improved but overloaded  Plan: 1 Stop IVF 2 Furosemide bolus 3 CXR  Subjective: Interval History: Dyspneic this AM  Objective: Vital signs in last 24 hours: Temp:  [98.9 F (37.2 C)-101.3 F (38.5 C)] 100.6 F (38.1 C) (06/21 0700) Pulse Rate:  [75-104] 78 (06/21 0900) Resp:  [22-35] 26 (06/21 0900) BP: (107-163)/(54-106) 153/106 mmHg (06/21 0900) SpO2:  [90 %-100 %] 95 % (06/21 0900) FiO2 (%):  [30 %-45 %] 40 % (06/21 0900) Weight:  [100 kg (220 lb 7.4 oz)] 100 kg (220 lb 7.4 oz) (06/21 0500) Weight change: 1.9 kg (4 lb 3 oz)  Intake/Output from previous day: 06/20 0701 - 06/21 0700 In: 2797.9 [I.V.:2647.9; IV Piggyback:150] Out: 1300 [Urine:1300] Intake/Output this shift: Total I/O In: 500 [I.V.:500] Out: -   General appearance: moderate distress Resp: rales bibasilar and bilaterally Cardio: regular rate and rhythm, S1, S2 normal, no murmur, click, rub or gallop Extremities: extremities normal, atraumatic, no cyanosis or edema  Lab Results:  Recent Labs  02/02/16 0746 02/04/16 0320  WBC 19.5* 12.5*  HGB 9.4* 9.1*  HCT 32.2* 30.2*  PLT 125* 165   BMET:  Recent Labs  02/03/16 0858 02/04/16 0320  NA 156* 151*  K 4.0 3.7  CL 127* 122*  CO2 22 21*  GLUCOSE 216* 329*  BUN 62* 50*  CREATININE 3.96* 3.18*  CALCIUM 7.4* 7.3*   No results for input(s): PTH in the last 72 hours. Iron Studies: No results for input(s): IRON, TIBC, TRANSFERRIN, FERRITIN in the last 72 hours. Studies/Results: [place] Renal  02/03/2016  CLINICAL DATA:  Acute renal insufficiency; history of diabetes and hypertension. EXAM: RENAL / URINARY TRACT ULTRASOUND COMPLETE COMPARISON:  None in PACs FINDINGS: Right Kidney: Length: 13.1 cm. The renal cortical echotexture is increased as compared to the liver. There is no focal mass. There is  no hydronephrosis. Left Kidney: Length: 12.9 cm. The renal cortical echotexture is mildly increased similar to that on the right. There is no hydronephrosis. Bladder: The partially distended urinary bladder is normal. IMPRESSION: Increased renal cortical echotexture consistent with medical renal disease. There is no hydronephrosis. Electronically Signed   By: David  SwazilandJordan M.D.   On: 02/03/2016 12:01   Dg Chest Port 1 View  02/02/2016  CLINICAL DATA:  75 year old male with respiratory distress and chest congestion. Initial encounter. EXAM: PORTABLE CHEST 1 VIEW COMPARISON:  02/01/2016 and earlier. FINDINGS: Portable AP semi upright view at 1318 hours. The patient is mildly rotated to the right. Mediastinal contours are stable and within normal limits. No pneumothorax. No pulmonary edema or definite effusion. Dense retrocardiac opacity persists with suggestion of air bronchograms, and is mostly obscuring the left hemidiaphragm. This appears stable since 01/31/2016, and improved since 01/26/2016. IMPRESSION: Continued left lower lobe consolidation compatible with pneumonia, stable since 01/31/2016. No definite pleural effusion. Electronically Signed   By: Odessa FlemingH  Hall M.D.   On: 02/02/2016 12:50    Scheduled: . antiseptic oral rinse  7 mL Mouth Rinse BID  . aspirin  300 mg Rectal Daily  . aztreonam  1 g Intravenous Q8H  . famotidine (PEPCID) IV  20 mg Intravenous Q12H  . furosemide  160 mg Intravenous Once  . heparin  5,000 Units Subcutaneous Q8H  . insulin aspart  0-15 Units Subcutaneous Q4H  . insulin glargine  15 Units Subcutaneous Daily  . ipratropium-albuterol  3  mL Nebulization TID  . nitroGLYCERIN  0.2 mg Transdermal Daily  . Rivastigmine  13.3 mg Transdermal Daily  . vancomycin  2,000 mg Intravenous Q48H     LOS: 11 days   Chistian Kasler C 02/04/2016,9:52 AM

## 2016-02-04 NOTE — Progress Notes (Addendum)
PULMONARY / CRITICAL CARE MEDICINE   Name: Jared [name] MRN: [id number] DOB: [birth year]    ADMISSION DATE:  01/24/2016  REFERRING MD:  [facility]   CHIEF COMPLAINT:  Acute respiratory failure, AKI   BRIEF:  75yo male with hx dementia, DM, previous CVA with residual L sided weakness initially admitted to [facility] 6/7 after a fall at home with L femoral neck fx which was surgically repaired.  Post op patient was unable to wean from vent.  Pt had progressive renal failure, volume overload and was tx to [facility] 6/10 for ongoing care and renal eval.   The patient remained in ICU on mechanical ventilation & vasopressors. He was diuresed with lasix with improvement in renal function.  Empiric abx were continued for PNA.  He was extubated on 6/12 without difficulty.  He was transferred to hospitalist service 6/16.  On 6/17, he became febrile with concerns for sepsis thought secondary to possible aspiration pneumonia and went into acute hypoxic respiratory on 6/18 requiring NRB and transferred to stepdown unit.  Empiric vancomycin and aztreonam was continued and he was supported on BiPAP.  The patient was DNR prior to admission and confirmed after presentation (he initially thought it would be reversal for surgery only).    He continued to have difficulty with respiratory status - on/off bipap.  Nephrology continued with aggressive diuresis.    PCCM asked to revaluate 6/21 for continued hypoxic respiratory failure requiring PRN BiPAP.   SUBJECTIVE:  Drowsy, but answers questions.  On 5L [place] and admits to SOB.    VITAL SIGNS: BP 134/66 mmHg  Pulse 91  Temp(Src) 98.3 F (36.8 C) (Oral)  Resp 29  Ht  (1.778 m)  Wt 220 lb 7.4 oz (100 kg)  BMI 31.63 kg/m2  SpO2 95%  HEMODYNAMICS:    VENTILATOR SETTINGS: Vent Mode:  [-]  FiO2 (%):  [30 %-45 %] 40 %  INTAKE / OUTPUT: I/O last 3 completed shifts: In: 2897.9 [I.V.:2647.9; IV Piggyback:250] Out: 1750 [Urine:1750]  PHYSICAL  EXAMINATION: General:  Chronically ill appearing male in NAD HENT: NCAT, supple, no JVD PULM: coarse, diminished with rales in left base, mildly tachypneic, weak cough CV: s1s2, RRR, no mgr GI: BS+ x4, soft, obese, nontender, nondistended MSK: normal bulk/tone Derm: no rashes or lesions Neuro: Drowsy but oriented to person / place, follows commands, pupils 3 mm equal brisk, generalized weakness  LABS:  BMET  Recent Labs Lab 02/02/16 0746 02/03/16 0858 02/04/16 0320  NA 154* 156* 151*  K 3.9 4.0 3.7  CL 125* 127* 122*  CO2 20* 22 21*  BUN 48* 62* 50*  CREATININE 3.22* 3.96* 3.18*  GLUCOSE 158* 216* 329*    Electrolytes  Recent Labs Lab 02/02/16 0746 02/03/16 0858 02/04/16 0320  CALCIUM 7.4* 7.4* 7.3*    CBC  Recent Labs Lab 02/02/16 0746 02/04/16 0320  WBC 19.5* 12.5*  HGB 9.4* 9.1*  HCT 32.2* 30.2*  PLT 125* 165    Coag's No results for input(s): APTT, INR in the last 168 hours.  Sepsis Markers No results for input(s): LATICACIDVEN, PROCALCITON, O2SATVEN in the last 168 hours.  ABG  Recent Labs Lab 02/01/16 1440 02/02/16 0928  PHART 7.362 7.298*  PCO2ART 39.6 47.3*  PO2ART 56.8* 66.1*    Liver Enzymes No results for input(s): AST, ALT, ALKPHOS, BILITOT, ALBUMIN in the last 168 hours.  Cardiac Enzymes No results for input(s): TROPONINI, PROBNP in the last 168 hours.  Glucose  Recent Labs Lab 02/03/16 [id number]  02/03/16 1205 02/03/16 1612 02/03/16 2135 02/04/16 0719 02/04/16 1252  GLUCAP 191* 278* 267* 239* 352* 279*    Imaging Dg Chest Port 1 View  02/04/2016  CLINICAL DATA:  Congestive heart failure. EXAM: PORTABLE CHEST 1 VIEW COMPARISON:  Radiograph of February 02, 2016. FINDINGS: Stable cardiomegaly. No pneumothorax is noted. No significant pleural effusion is noted. Stable bibasilar opacities are noted concerning for subsegmental atelectasis or possibly infiltrates. Bony thorax is unremarkable. IMPRESSION: Stable bibasilar opacities  are noted concerning for subsegmental atelectasis or possibly infiltrates. Electronically Signed   By: Lupita RaiderJames  Green Jr, M.D.   On: 02/04/2016 10:19    STUDIES:  TTE 6/11 >> preserved LV fxn, apical ballooning/ aneurysm, EF 55%, mildly dilated LA  CULTURES: BC x 2 6/10>> negative  Urine 6/10>> negative  UC 6/17 >> negative BCx2 6/17 >>   ANTIBIOTICS: Vanc 6/10 >> 6/11 Imipenem 6/10 >> 7/16 Aztreonam 6/17 >>   SIGNIFICANT EVENTS: 6/08 Surgical repair L hip fx  6/10  tx [facility]  6/12  Extubated 6/18  fever, sepsis, hypoxia, BiPAP 6/21  PCCM called back to evaluate for hypoxic resp fx, concern for decline   LINES/TUBES: ETT 6/8 >> 6/12  DISCUSSION: 74yo male with hx dementia [facility] at home with L hip fx.  Unable to wean from vent post op.  Course c/b acute renal failure, volume overload.  Treated for possible HCAP.  Diuresed on lasix and extubated on 6/12.  Patient has had a complicated course with improvement waxing with periods of decline.  6/21, PCCM called back for respiratory distress, concern for decline.     ASSESSMENT:  Acute on chronic hypoxic respiratory failure HCAP vs Aspiration Pneumonia vs CHF AKI Hypernatremia Hx Vascular Dementia and CVA DNR  PLAN:    Cycle BiPAP as needed > 4 hours on, up to 2 hours off for the next 24 hours Follow up CXR in am  Diuresis as renal function permit  Nebulized saline for secretion clearance  Wean O2 for Sp02 > 92% Pulmonary Hygiene with IS and mobilize as tolerated Continue Duo Nebs with PRN albuterol  Empiric Aztreonam Continue Lasix per nephrology recommendations Vibra vest  NTS PRN  Minimize sedating medications Aspiration precautions Continue SLP efforts, high suspicion for ongoing aspiration in the setting of CVA, weakness / deconditioning  Consider Palliative Care consult      If all of the above to not contribute to improvement or he has a precipitous decline, consider comfort  measures.    FAMILY  - Updates: no family available 6/21.  Canary BrimBrandi Ollis, NP-C [facility] Pgr: [phone] or if no answer [phone] 02/04/2016, 2:26 PM  Attending Note:  75 year old male with AKI and CHF who is fluid overloaded and likely has a PNA. Patient is in hypoxic and hypercarbic respiratory failure, increased WOB on exam. Patient is DNR and is fluid overloaded. CXR I reviewed myself, pulmonary edema and low lung volumes. Would intubate but patient does not wish for that. Will emergently place BiPAP on patient. Agree with abx choices. Diureses as ordered. If fails or WOB continues to increase then recommend full comfort care. Patient is clearly struggling.   PCCM will sign off, please call back if needed.  The patient is critically ill with multiple organ systems failure and requires high complexity decision making for assessment and support, frequent evaluation and titration of therapies, application of advanced monitoring technologies and extensive interpretation of multiple databases.   Critical Care  Time devoted to patient care services described in this note is 35 Minutes. This time reflects time of care of this signee Dr Koren Bound. This critical care time does not reflect procedure time, or teaching time or supervisory time of PA/NP/Med student/Med Resident etc but could involve care discussion time.  Alyson Reedy, M.D. [facility] Pulmonary/Critical Care Medicine. Pager: [phone]. After hours pager: [phone].

## 2016-02-04 NOTE — Progress Notes (Signed)
SLP Cancellation Note  Patient Details Name: Jared [name] MRN: [id number] DOB: 03/02/1941   Cancelled treatment:       Reason Eval/Treat Not Completed: Medical issues which prohibited therapy; back on BiPap/  Will f/u next date for readiness for POs.    Blenda MountsCouture, Demetric Parslow Laurice 02/04/2016, 2:19 PM

## 2016-02-04 NOTE — Progress Notes (Signed)
Inpatient Diabetes Program Recommendations  AACE/ADA: New Consensus Statement on Inpatient Glycemic Control (2015)  Target Ranges:  Prepandial:   less than 140 mg/dL      Peak postprandial:   less than 180 mg/dL (1-2 hours)      Critically ill patients:  140 - 180 mg/dL   Lab Results  Component Value Date   GLUCAP 352* 02/04/2016    Review of Glycemic Control  Inpatient Diabetes Program Recommendations:  Insulin - IV drip/GlucoStabilizer: . Insulin - Basal: Will likely need Lantus 30 units  Thank you  Piedad ClimesGina Lory Galan MSN, RN,CDE Inpatient Diabetes Coordinator [phone] (team pager)

## 2016-02-04 NOTE — Progress Notes (Signed)
PT Cancellation Note  Patient Details Name: Jared [name] MRN: [id number] DOB: Jan 28, 1941   Cancelled Treatment:    Reason Eval/Treat Not Completed: Medical issues which prohibited therapy Per RN, respiratory status is not doing well enough for incr activity. (Noted on Venturi mask). Will continue to follow and progress as pt medically appropriate.   Keah Lamba 02/04/2016, 9:49 AM  Pager [phone]

## 2016-02-04 NOTE — Progress Notes (Addendum)
Nutrition Follow-up  DOCUMENTATION CODES:   Obesity unspecified  INTERVENTION:   PO diet re-advancement vs TF initiation, RD to monitor for nutrition care plan  NUTRITION DIAGNOSIS:   Inadequate oral intake related to inability to eat as evidenced by NPO status, ongoing  GOAL:   Patient will meet greater than or equal to 90% of their needs, currently unmet  MONITOR:   Diet advancement, PO intake, Supplement acceptance, Weight trends, Labs, I & O's  ASSESSMENT:   75yo male with hx dementia, DM, previous CVA with residual L sided weakness [facility] after a fall at home with L femoral neck fx which was surgically repaired. Post op patient was unable to wean from vent. Pt had progressive renal failure, volume overload and was tx to [facility] 6/10 for ongoing care and renal eval.    Patient extubated 6/12. Rapid Response event noted 6/18. Pt now NPO >> s/p bedside swallow evaluation 6/20. Previously on a Dys 1-nectar thick liquid diet and receiving Glucerna Shake TID. Question whether pt will need short term nutrition support.  Diet Order:  Diet NPO time specified  Skin:  Wound (see comment) (full thickness wound to scrotum)  Last BM:  6/19  Height:   Ht Readings from Last 1 Encounters:  02/01/16 5\' 10"  (1.778 m)    Weight:   Wt Readings from Last 1 Encounters:  02/05/16 216 lb 7.9 oz (98.2 kg)    Ideal Body Weight:  80.9 kg  BMI:  Body mass index is 31.06 kg/(m^2).  Estimated Nutritional Needs:   Kcal:  2000-2200  Protein:  110-130 grams  Fluid:  2- 2.2 L/day  EDUCATION NEEDS:   No education needs identified at this time  Maureen ChattersKatie Lindy Garczynski, RD, LDN Pager #: [phone] After-Hours Pager #: [phone]

## 2016-02-04 NOTE — Progress Notes (Signed)
CSW continuing to follow for eventual SNF placement when current medical issues resolve  Merlyn LotJenna Holoman, [facility] Health CenterCSWA Clinical Social Worker [phone]

## 2016-02-04 NOTE — Progress Notes (Signed)
[facility] Progress Note  Patient: Jared [name] WUJ:[id number]   PCP: Feliciana Rossetti, MD DOB: 1941-03-06   DOA: 01/24/2016   DOS: 02/04/2016   Date of Service: the patient was seen and examined on 02/04/2016  Subjective: The patient mentions his breathing is about the same as yesterday. He denies any chest pain or abdominal pain. No nausea no vomiting. No fever no chills. Nutrition: Remains nothing by mouth until speech evaluation  Brief hospital course: Pt. with PMH of CVA with residual left-sided weakness, dementia, diabetes mellitus; admitted on 01/24/2016, with complaint of ventilator dependent respiratory failure, patient was initially admitted in [facility] on 01/21/2016 after a fall at home with the left humeral neck fracture. Postoperatively the patient was unable to be extubated and had progressive renal failure with volume overload and therefore was transferred to [facility] on 16 2017. Patient received IV antibiotics and with improvement in oxygenation and he was extubated on 01/26/2016. Patient was transferred to hospitalist service and completed antibiotic course on 01/30/2016. 01/31/2016 the patient become febrile with worsening oxygenation with acute hypoxic respiratory failure and was transferred to step down unit on 02/01/2016 with BiPAP support. Currently further plan is continue BiPAP support.  Assessment and Plan: 1. Acute respiratory failure with hypoxia (HCC) Sepsis with healthcare associated pneumonia. Patient completed 7 days of IV antibiotics, initially was able to be extubated. With worsening respiratory status transferred back to step down unit. Continuing BiPAP at present. Continuing empiric aztreonam, day 5 of antibiotics. MRSA negative,  blood cultures negative, urine culture negative. Prognosis is guarded. Patient was started on IV fluids for acute kidney injury and appears to have developed volume overload on 02/04/2016 and is receiving IV Lasix per  nephrology.  2. Acute kidney injury. Hypernatremia. Appreciate nephrology consultation. Probably hemodynamically mediated with hypotension as well as enalapril. Worsen with postoperative hypovolemia. Was treated with IV fluids. Sodium improved with IV fluids as well. At appears that the patient has developed volume overload on 02/04/2016 and irregular staining Lasix bolus. We'll continue to monitor the patient in the step down unit.  3. Diabetes mellitus with hyperglycemia. Patient currently nothing by mouth with concern of aspiration. Continue sliding scale coverage at present.  4. Dysphagia. Protein calorie malnutrition in the context of critical illness. Speech therapy unable to evaluate the patient due to respiratory status. If requires persistent nothing by mouth status on 02/05/2016 we will discuss with patient as well as family regarding coRtrack insertion.  5. Left humeral neck fracture. Surgically repaired on 01/21/2016. Continue heparin for DVT prophylaxis. PT recommends SNF.  6. History of CVA with right-sided weakness. At on Aggrenox but due to her nothing by mouth status we will use suppository aspirin.  7. Vascular dementia. Holding Namenda due to nothing by mouth status. Continue patch. Also holding Seroquel for depression.  8. Goals of care discussion. Prognosis is guarded for the patient. Patient refused to allow me to discuss with patient's family at present. The patient's condition worsens further patient's family need to be notified.  Pain management: When necessary Tylenol Activity: SNF as per physical therapy Bowel regimen: last BM 02/04/2016 Diet: Nothing by mouth DVT Prophylaxis: subcutaneous Heparin  Advance goals of care discussion: DNR/DNI as per earlier discussion  Family Communication: Patient did not provide any permission to discuss with family member.   Disposition:  At present guarded prognosis. We will await improvement in clinical  condition before disposition.  Consultants: Critical care, nephrology Procedures: Extubation Echocardiogram  Antibiotics: Anti-infectives    Start  Dose/Rate Route Frequency Ordered Stop   02/04/16 0845  vancomycin (VANCOCIN) 2,000 mg in sodium chloride 0.9 % 500 mL IVPB  Status:  Discontinued     2,000 mg 250 mL/hr over 120 Minutes Intravenous Every 48 hours 02/04/16 0832 02/04/16 1132   02/02/16 1400  aztreonam (AZACTAM) 1 g in dextrose 5 % 50 mL IVPB     1 g 100 mL/hr over 30 Minutes Intravenous Every 8 hours 02/02/16 1229 02/08/16 2159   02/01/16 2300  vancomycin (VANCOCIN) 1,250 mg in sodium chloride 0.9 % 250 mL IVPB  Status:  Discontinued     1,250 mg 166.7 mL/hr over 90 Minutes Intravenous Every 24 hours 01/31/16 2133 02/02/16 1229   01/31/16 2200  aztreonam (AZACTAM) 2 g in dextrose 5 % 50 mL IVPB  Status:  Discontinued     2 g 100 mL/hr over 30 Minutes Intravenous Every 8 hours 01/31/16 2119 02/02/16 1229   01/31/16 2200  vancomycin (VANCOCIN) 1,750 mg in sodium chloride 0.9 % 500 mL IVPB     1,750 mg 250 mL/hr over 120 Minutes Intravenous  Once 01/31/16 2133 02/01/16 0113   01/27/16 1800  imipenem-cilastatin (PRIMAXIN) 500 mg in sodium chloride 0.9 % 100 mL IVPB  Status:  Discontinued     500 mg 200 mL/hr over 30 Minutes Intravenous Every 8 hours 01/27/16 1206 01/30/16 1616   01/26/16 1200  imipenem-cilastatin (PRIMAXIN) 250 mg in sodium chloride 0.9 % 100 mL IVPB  Status:  Discontinued     250 mg 200 mL/hr over 30 Minutes Intravenous Every 6 hours 01/26/16 1007 01/27/16 1206   01/25/16 0500  imipenem-cilastatin (PRIMAXIN) 250 mg in sodium chloride 0.9 % 100 mL IVPB  Status:  Discontinued     250 mg 200 mL/hr over 30 Minutes Intravenous Every 12 hours 01/24/16 1817 01/26/16 1007   01/24/16 1700  vancomycin (VANCOCIN) 2,000 mg in sodium chloride 0.9 % 500 mL IVPB     2,000 mg 250 mL/hr over 120 Minutes Intravenous  Once 01/24/16 1628 01/24/16 2038   01/24/16 1700   imipenem-cilastatin (PRIMAXIN) 500 mg in sodium chloride 0.9 % 100 mL IVPB     500 mg 200 mL/hr over 30 Minutes Intravenous  Once 01/24/16 1628 01/24/16 1821        Intake/Output Summary (Last 24 hours) at 02/04/16 1700 Last data filed at 02/04/16 0900  Gross per 24 hour  Intake 3247.92 ml  Output    850 ml  Net 2397.92 ml   Filed Weights   02/02/16 0500 02/03/16 0500 02/04/16 0500  Weight: 98.6 kg (217 lb 6 oz) 98.1 kg (216 lb 4.3 oz) 100 kg (220 lb 7.4 oz)    Objective: Physical Exam: Filed Vitals:   02/04/16 1353 02/04/16 1448 02/04/16 1503 02/04/16 1612  BP: 130/64  140/80   Pulse: 103  106 108  Temp:      TempSrc:      Resp:   30 15  Height:      Weight:      SpO2: 100% 100% 100% 100%    General: Alert, Awake and Oriented to Place and Person. Appear in moderate distress Eyes: PERRL, Conjunctiva normal ENT: Oral Mucosa clear moist. Neck: DA JVD, NO Abnormal Mass Or lumps Cardiovascular: S1 and S2 Present, aortic systolic Murmur, Respiratory: Bilateral Air entry equal and Decreased, bilateral Crackles, no wheezes Abdomen: Bowel Sound present, Soft and no tenderness Skin: no redness, no Rash  Extremities: no Pedal edema, no calf tenderness Neurologic: Grossly no  focal neuro deficit. Bilaterally Equal motor strength  Data Reviewed: CBC:  Recent Labs Lab 02/02/16 0746 02/04/16 0320  WBC 19.5* 12.5*  HGB 9.4* 9.1*  HCT 32.2* 30.2*  MCV 99.7 97.7  PLT 125* 165   Basic Metabolic Panel:  Recent Labs Lab 01/31/16 1824 02/01/16 0537 02/02/16 0746 02/03/16 0858 02/04/16 0320  NA 154* 155* 154* 156* 151*  K 3.5 3.8 3.9 4.0 3.7  CL 125* 124* 125* 127* 122*  CO2 21* 23 20* 22 21*  GLUCOSE 159* 236* 158* 216* 329*  BUN 29* 29* 48* 62* 50*  CREATININE 2.07* 2.19* 3.22* 3.96* 3.18*  CALCIUM 7.7* 7.5* 7.4* 7.4* 7.3*    Liver Function Tests: No results for input(s): AST, ALT, ALKPHOS, BILITOT, PROT, ALBUMIN in the last 168 hours. No results for input(s):  LIPASE, AMYLASE in the last 168 hours. No results for input(s): AMMONIA in the last 168 hours. Coagulation Profile: No results for input(s): INR, PROTIME in the last 168 hours. Cardiac Enzymes: No results for input(s): CKTOTAL, CKMB, CKMBINDEX, TROPONINI in the last 168 hours. BNP (last 3 results) No results for input(s): PROBNP in the last 8760 hours.  CBG:  Recent Labs Lab 02/03/16 1205 02/03/16 1612 02/03/16 2135 02/04/16 0719 02/04/16 1252  GLUCAP 278* 267* 239* 352* 279*    Studies: Dg Chest Port 1 View  02/04/2016  CLINICAL DATA:  Congestive heart failure. EXAM: PORTABLE CHEST 1 VIEW COMPARISON:  Radiograph of February 02, 2016. FINDINGS: Stable cardiomegaly. No pneumothorax is noted. No significant pleural effusion is noted. Stable bibasilar opacities are noted concerning for subsegmental atelectasis or possibly infiltrates. Bony thorax is unremarkable. IMPRESSION: Stable bibasilar opacities are noted concerning for subsegmental atelectasis or possibly infiltrates. Electronically Signed   By: Lupita RaiderJames  Green Jr, M.D.   On: 02/04/2016 10:19     Scheduled Meds: . antiseptic oral rinse  7 mL Mouth Rinse BID  . aspirin  300 mg Rectal Daily  . aztreonam  1 g Intravenous Q8H  . famotidine (PEPCID) IV  20 mg Intravenous Q12H  . heparin  5,000 Units Subcutaneous Q8H  . insulin aspart  0-15 Units Subcutaneous Q4H  . insulin glargine  15 Units Subcutaneous Daily  . ipratropium-albuterol  3 mL Nebulization Q6H  . nitroGLYCERIN  0.2 mg Transdermal Daily  . Rivastigmine  13.3 mg Transdermal Daily  . sodium chloride  3 mL Nebulization Q8H   Continuous Infusions:  PRN Meds: acetaminophen (TYLENOL) oral liquid 160 mg/5 mL, acetaminophen, ipratropium-albuterol, RESOURCE THICKENUP CLEAR  Time spent: 30 minutes  Author: Lynden OxfordPranav Patel, MD [facility] Pager: [phone] 02/04/2016 5:00 PM  If 7PM-7AM, please contact night-coverage at [email or web address], password [email or web address]

## 2016-02-04 NOTE — Progress Notes (Signed)
Pharmacy Antibiotic Note  Jared [name] is a 75 y.o. [name] admitted on 01/24/2016 with acute respiratory failure and AKI. Pt on Day #5 of Aztreonam and Vancomycin for HCAP. SCr stabilizing at 3.18. Nephrology following.  Random vancomycin level drawn this AM 21 mcg/ml (slightly SUPRAtherapeutic for goal 15-20). Will restart vanc this AM as anticipate now back in therapeutic range from level drawn at 0330.  Plan: Restart vanc 2g IV q48h Continue Aztreonam 1g IV q8h Will continue to follow renal function, culture results, LOT, and antibiotic de-escalation plans VT at new Css   Height: 5\' 10"  (177.8 cm) Weight: 220 lb 7.4 oz (100 kg) IBW/kg (Calculated) : 73  Temp (24hrs), Avg:100.1 F (37.8 C), Min:98.9 F (37.2 C), Max:101.3 F (38.5 C)   Recent Labs Lab 01/31/16 1824 02/01/16 0537 02/02/16 0746 02/02/16 2230 02/03/16 0858 02/04/16 0320 02/04/16 0330  WBC  --   --  19.5*  --   --  12.5*  --   CREATININE 2.07* 2.19* 3.22*  --  3.96* 3.18*  --   VANCORANDOM  --   --   --  38  --   --  21    Estimated Creatinine Clearance: 24.9 mL/min (by C-G formula based on Cr of 3.18).    Allergies  Allergen Reactions  . Penicillins Hives    Early childhood reaction - no other information available    Antimicrobials this admission: Vanc 6/10 x 1; restart 6/17 >> Primaxin 6/10 >> 6/16 Azactam 6/17 >>  Dose adjustments this admission: 6/19 VT: 37 on 1250mg  q12h 6/21 VR: 21  Microbiology results: 6/10 BCx: ngf 6/11 UC: ngf 6/11 mrsa pcr: neg 6/17 BCx: ngtd  Jared [name], PharmD, BCPS Clinical Pharmacist Pager [phone] 02/04/2016 8:31 AM

## 2016-02-05 ENCOUNTER — Inpatient Hospital Stay (HOSPITAL_COMMUNITY): Payer: Medicare Other

## 2016-02-05 DIAGNOSIS — Z515 Encounter for palliative care: Secondary | ICD-10-CM | POA: Insufficient documentation

## 2016-02-05 DIAGNOSIS — Z66 Do not resuscitate: Secondary | ICD-10-CM

## 2016-02-05 DIAGNOSIS — R1310 Dysphagia, unspecified: Secondary | ICD-10-CM | POA: Insufficient documentation

## 2016-02-05 LAB — RENAL FUNCTION PANEL
ALBUMIN: 1.6 g/dL — AB (ref 3.5–5.0)
ANION GAP: 10 (ref 5–15)
BUN: 47 mg/dL — ABNORMAL HIGH (ref 6–20)
CALCIUM: 7.7 mg/dL — AB (ref 8.9–10.3)
CO2: 22 mmol/L (ref 22–32)
Chloride: 122 mmol/L — ABNORMAL HIGH (ref 101–111)
Creatinine, Ser: 3 mg/dL — ABNORMAL HIGH (ref 0.61–1.24)
GFR calc non Af Amer: 19 mL/min — ABNORMAL LOW (ref >60)
GFR, EST AFRICAN AMERICAN: 22 mL/min — AB (ref >60)
GLUCOSE: 188 mg/dL — AB (ref 65–99)
PHOSPHORUS: 3.7 mg/dL (ref 2.5–4.6)
POTASSIUM: 3.6 mmol/L (ref 3.5–5.1)
SODIUM: 154 mmol/L — AB (ref 135–145)

## 2016-02-05 LAB — CBC WITH DIFFERENTIAL/PLATELET
BASOS PCT: 0 %
Basophils Absolute: 0 10*3/uL (ref 0.0–0.1)
EOS ABS: 0.2 10*3/uL (ref 0.0–0.7)
EOS PCT: 2 %
HEMATOCRIT: 31.1 % — AB (ref 39.0–52.0)
HEMOGLOBIN: 9.5 g/dL — AB (ref 13.0–17.0)
Lymphocytes Relative: 11 %
Lymphs Abs: 1.4 10*3/uL (ref 0.7–4.0)
MCH: 29 pg (ref 26.0–34.0)
MCHC: 30.5 g/dL (ref 30.0–36.0)
MCV: 94.8 fL (ref 78.0–100.0)
MONO ABS: 0.6 10*3/uL (ref 0.1–1.0)
Monocytes Relative: 5 %
NEUTROS ABS: 10.1 10*3/uL — AB (ref 1.7–7.7)
Neutrophils Relative %: 82 %
Platelets: 187 10*3/uL (ref 150–400)
RBC: 3.28 MIL/uL — ABNORMAL LOW (ref 4.22–5.81)
RDW: 17.1 % — AB (ref 11.5–15.5)
WBC: 12.3 10*3/uL — ABNORMAL HIGH (ref 4.0–10.5)

## 2016-02-05 LAB — MAGNESIUM: Magnesium: 2.4 mg/dL (ref 1.7–2.4)

## 2016-02-05 LAB — GLUCOSE, CAPILLARY
GLUCOSE-CAPILLARY: 180 mg/dL — AB (ref 65–99)
GLUCOSE-CAPILLARY: 221 mg/dL — AB (ref 65–99)
GLUCOSE-CAPILLARY: 242 mg/dL — AB (ref 65–99)
GLUCOSE-CAPILLARY: 223 mg/dL — AB (ref 65–99)
Glucose-Capillary: 124 mg/dL — ABNORMAL HIGH (ref 65–99)
Glucose-Capillary: 158 mg/dL — ABNORMAL HIGH (ref 65–99)
Glucose-Capillary: 215 mg/dL — ABNORMAL HIGH (ref 65–99)

## 2016-02-05 LAB — CULTURE, BLOOD (ROUTINE X 2)
CULTURE: NO GROWTH
CULTURE: NO GROWTH

## 2016-02-05 MED ORDER — SODIUM CHLORIDE 0.9 % IV SOLN
250.0000 mg | Freq: Four times a day (QID) | INTRAVENOUS | Status: DC
  Administered 2016-02-05 – 2016-02-08 (×13): 250 mg via INTRAVENOUS
  Filled 2016-02-05 (×15): qty 250

## 2016-02-05 NOTE — Consult Note (Signed)
Consultation Note Date: 02/05/2016   Patient Name: Jared [name]  DOB: [birth year]  MRN: [id number]  Age / Sex: 75 y.o., male  PCP: Gordan PaymentGreg A. Grisso, MD Referring Physician: Rolly SalterPranav M Patel, MD  Reason for Consultation: Establish GOC and emotional support  HPI/Patient Profile: 75 y.o. male admitted on 01/24/2016 with PMH High blood pressure; GERD (gastroesophageal reflux disease); Bruises easily; Anxiety; Hearing loss; Diabetes mellitus; Stroke [facility](HCC); and Heart attack (HCC), dementia, CVA and continued slow physical, functional and cognitive decline.     [facility] after a fall at home with L femoral neck fx which was surgically repaired. Post op patient was unable to wean from vent. Pt had progressive renal failure, volume overload and was tx to [facility] 6/10 for ongoing care and renal eval.   Currently on high flow oxygen, renal failure, dysphagia, PNA, continued physical, functional and cognitive decline, and overall poor prognosis.   Clinical Assessment and Goals of Care:  This NP Lorinda CreedMary Larach reviewed medical records, received report from team, assessed the patient and then meet at the patient's bedside along with his wife, daughter and son,  to discuss diagnosis, prognosis, GOC, EOL wishes disposition and options.   A detailed discussion was had today regarding advanced directives.  Concepts specific to code status, artifical feeding and hydration, continued IV antibiotics and rehospitalization was had.  The difference between a aggressive medical intervention path  and a palliative comfort care path for this patient at this time was had.  Values and goals of care important to patient and family were attempted to be elicited.  Concept of Hospice and Palliative Care were discussed  Natural trajectory and expectations at EOL were discussed.  Questions and concerns addressed.  Hard  Choices booklet left for review. Family encouraged to call with questions or concerns.  PMT will continue to support holistically.  MOST form introduced    SUMMARY OF RECOMMENDATIONS    - DNR/DNI  - continue current medical interventions, family is hopeful for improvement.    - continued support for family as they navigate hard decisions related to advanced directives, anticipatory care needs and EOL  decisions   Code Status/Advance Care Planning:   DNR/DNI   Psycho-social/Spiritual:   Family "very surprised" to hear today's information regarding  the overall poor prognosis.  Emotional support offered.    Desire for further Chaplaincy support: yes  Additional Recommendations: information on hospice  Prognosis:   Dependent on desire for life prolonging interventions, regardless of medical intervention overall prognosis is poor; likely weeks to months     Primary Diagnoses: Present on Admission:  . Acute kidney injury (HCC) . Acute respiratory failure with hypoxia (HCC) . HCAP (healthcare-associated pneumonia) . Vascular dementia  I have reviewed the medical record, interviewed the patient and family, and examined the patient. The following aspects are pertinent.  Past Medical History  Diagnosis Date  . High blood pressure   . GERD (gastroesophageal reflux disease)   . Bruises easily   . Anxiety   .  Hearing loss   . Diabetes mellitus   . Stroke (HCC)   . Heart attack [facility])    Social History   Social History  . Marital Status: Married    Spouse Name: N/A  . Number of Children: N/A  . Years of Education: N/A   Social History Main Topics  . Smoking status: Never Smoker   . Smokeless tobacco: Never Used  . Alcohol Use: No  . Drug Use: Not on file  . Sexual Activity: Not on file   Other Topics Concern  . Not on file   Social History Narrative   No family history on file. Scheduled Meds: . antiseptic oral rinse  7 mL Mouth Rinse BID  . aspirin  300  mg Rectal Daily  . famotidine (PEPCID) IV  20 mg Intravenous Q12H  . heparin  5,000 Units Subcutaneous Q8H  . imipenem-cilastatin  250 mg Intravenous Q6H  . insulin aspart  0-15 Units Subcutaneous Q4H  . insulin glargine  15 Units Subcutaneous Daily  . ipratropium-albuterol  3 mL Nebulization Q6H  . nitroGLYCERIN  0.2 mg Transdermal Daily  . Rivastigmine  13.3 mg Transdermal Daily  . sodium chloride  3 mL Nebulization Q8H   Continuous Infusions:  PRN Meds:.acetaminophen (TYLENOL) oral liquid 160 mg/5 mL, acetaminophen, ipratropium-albuterol, RESOURCE THICKENUP CLEAR Medications Prior to Admission:  Prior to Admission medications   Medication Sig Start Date End Date Taking? Authorizing Provider  citalopram (CELEXA) 40 MG tablet Take 20 mg by mouth daily.   Yes Historical Provider, MD  dipyridamole-aspirin (AGGRENOX) 200-25 MG 12hr capsule Take 1 capsule by mouth 2 (two) times daily.   Yes Historical Provider, MD  enalapril (VASOTEC) 20 MG tablet Take 20 mg by mouth daily.   Yes Historical Provider, MD  furosemide (LASIX) 20 MG tablet Take 20 mg by mouth daily.   Yes Historical Provider, MD  glimepiride (AMARYL) 2 MG tablet Take 4 mg by mouth daily with breakfast. 01/23/16  Yes Historical Provider, MD  insulin glargine (LANTUS) 100 UNIT/ML injection Inject 20 Units into the skin daily.   Yes Historical Provider, MD  isosorbide mononitrate (IMDUR) 30 MG 24 hr tablet Take 30 mg by mouth daily.   Yes Historical Provider, MD  memantine (NAMENDA XR) 28 MG CP24 24 hr capsule Take 28 mg by mouth daily with supper.   Yes Historical Provider, MD  metFORMIN (GLUCOPHAGE) 500 MG tablet Take 500 mg by mouth 3 (three) times daily with meals.   Yes Historical Provider, MD  metoprolol tartrate (LOPRESSOR) 25 MG tablet Take 12.5 mg by mouth daily.    Yes Historical Provider, MD  niacin (SLO-NIACIN) 500 MG tablet Take 500 mg by mouth at bedtime.   Yes Historical Provider, MD  pantoprazole (PROTONIX) 40 MG  tablet Take 40 mg by mouth daily.   Yes Historical Provider, MD  Polyvinyl Alcohol-Povidone (REFRESH OP) Place 1 drop into both eyes 3 (three) times daily.   Yes Historical Provider, MD  pravastatin (PRAVACHOL) 40 MG tablet Take 40 mg by mouth at bedtime.   Yes Historical Provider, MD  QUEtiapine (SEROQUEL) 25 MG tablet Take 25 mg by mouth daily at 12 noon. 01/23/16  Yes Historical Provider, MD  Rivastigmine 13.3 MG/24HR PT24 Take 13.3 mg by mouth daily. Exelon 01/23/16  Yes Historical Provider, MD  tamsulosin (FLOMAX) 0.4 MG CAPS capsule Take 0.4 mg by mouth at bedtime.   Yes Historical Provider, MD   Allergies  Allergen Reactions  . Penicillins Hives  Early childhood reaction - no other information available   Review of Systems  Physical Exam  Vital Signs: BP 123/82 mmHg  Pulse 96  Temp(Src) 97.9 F (36.6 C) (Axillary)  Resp 22  Ht 5\' 10"  (1.778 m)  Wt 98.2 kg (216 lb 7.9 oz)  BMI 31.06 kg/m2  SpO2 94% Pain Assessment: CPOT       SpO2: SpO2: 94 % O2 Device:SpO2: 94 % O2 Flow Rate: .O2 Flow Rate (L/min): 40 L/min  IO: Intake/output summary:  Intake/Output Summary (Last 24 hours) at 02/05/16 2008 Last data filed at 02/05/16 1839  Gross per 24 hour  Intake    600 ml  Output   1250 ml  Net   -650 ml    LBM: Last BM Date: 02/05/16 Baseline Weight: Weight: 101.2 kg (223 lb 1.7 oz) Most recent weight: Weight: 98.2 kg (216 lb 7.9 oz)      Palliative Assessment/Data:  30 % at best   Discussed with Dr Allena KatzPatel  Time In: 1330 Time Out: 1500 Time Total: 90 min Greater than 50%  of this time was spent counseling and coordinating care related to the above assessment and plan.  Signed by: Lorinda CreedLARACH, MARY, NP   Please contact Palliative Medicine Team phone at [phone] for questions and concerns.  For individual provider: See Loretha StaplerAmion

## 2016-02-05 NOTE — Progress Notes (Signed)
[facility] Progress Note  Patient: Jared [name] BHA:[id number]   PCP: Feliciana Rossetti, MD DOB: 08-08-1941   DOA: 01/24/2016   DOS: 02/05/2016   Date of Service: the patient was seen and examined on 02/05/2016  Subjective: Patient minimally interactive. Denies any acute complaint. Denies any shortness of breath. This morning has been placed on high flow nasal cannula and appears to be tolerating it well. Nutrition: Remains nothing by mouth until speech evaluation  Brief hospital course: Pt. with PMH of CVA with residual left-sided weakness, dementia, diabetes mellitus; admitted on 01/24/2016, with complaint of ventilator dependent respiratory failure, patient was initially admitted in [facility] on 01/21/2016 after a fall at home with the left humeral neck fracture. Postoperatively the patient was unable to be extubated and had progressive renal failure with volume overload and therefore was transferred to [facility] on 16 2017. Patient received IV antibiotics and with improvement in oxygenation and he was extubated on 01/26/2016. Patient was transferred to hospitalist service and completed antibiotic course on 01/30/2016. 01/31/2016 the patient become febrile with worsening oxygenation with acute hypoxic respiratory failure and was transferred to step down unit on 02/01/2016 with BiPAP support. Currently further plan is continue BiPAP support.  Assessment and Plan: 1. Acute respiratory failure with hypoxia (HCC) Sepsis with healthcare associated pneumonia. Patient completed 7 days of IV antibiotics, initially was able to be extubated. With worsening respiratory status transferred back to step down unit. Continuing BiPAP as needed, continue high flow nasal cannula at present. Switching aztreonam to Primaxin. MRSA negative,  blood cultures negative, urine culture negative. Prognosis is guarded. Patient was started on IV fluids for acute kidney injury and appears to have developed  volume overload on 02/04/2016 and a IV Lasix 1 per nephrology. At present no further plan for IV Lasix due to increase in the sodium level.  2. Acute kidney injury. Hypernatremia. Appreciate nephrology consultation. Probably hemodynamically mediated with hypotension as well as enalapril. Worsen with postoperative hypovolemia. Was treated with IV fluids. Sodium improved with IV fluids as well. At appears that the patient has developed volume overload on 02/04/2016 and given Lasix We'll continue to monitor the patient in the step down unit.  3. Diabetes mellitus with hyperglycemia. Patient currently nothing by mouth with concern of aspiration. Continue sliding scale coverage at present.  4. Dysphagia. Protein calorie malnutrition in the context of critical illness. Speech therapy unable to evaluate the patient due to respiratory status. Persistently remains nothing by mouth at present. Palliative care consulted. We will await recommendation regarding feeding tube. We will attempt to discuss with family again on 02/06/2016.  5. Left humeral neck fracture. Surgically repaired on 01/21/2016. Continue heparin for DVT prophylaxis. PT recommends SNF.  6. History of CVA with right-sided weakness. At on Aggrenox but due to her nothing by mouth status we will use suppository aspirin.  7. Vascular dementia. Holding Namenda due to nothing by mouth status. Continue patch. Also holding Seroquel for depression.  8. Scrotal ulcer. From severe edema. Also worsening from diarrhea. We will discuss with family on 02/05/2026. Regarding goals of care as well as further treatment and may require urology consultation.  9. Goals of care discussion. Prognosis is guarded for the patient. Discussed with patient's family regarding patient's guarded prognosis as well as worsening respiratory status. Also discussed regarding difficulty improving patient's sodium as well as renal function as well as  regaining adequate nutrition with future risk for aspiration. Palliative care was going to consult with patient's son as well  as wife at bedside, we will await recommendation that comes out from that discussion.  Pain management: When necessary Tylenol Activity: SNF as per physical therapy Bowel regimen: last BM 02/04/2016 Diet: Nothing by mouth DVT Prophylaxis: subcutaneous Heparin  Advance goals of care discussion: DNR/DNI as per earlier discussion  Family Communication: Family was at bedside and all questions were answered satisfactorily.   Disposition:  At present guarded prognosis. We will await improvement in clinical condition before disposition.  Consultants: Critical care, nephrology Procedures: Extubation Echocardiogram  Antibiotics: Anti-infectives    Start     Dose/Rate Route Frequency Ordered Stop   02/05/16 0830  imipenem-cilastatin (PRIMAXIN) 250 mg in sodium chloride 0.9 % 100 mL IVPB     250 mg 200 mL/hr over 30 Minutes Intravenous Every 6 hours 02/05/16 0818     02/04/16 0845  vancomycin (VANCOCIN) 2,000 mg in sodium chloride 0.9 % 500 mL IVPB  Status:  Discontinued     2,000 mg 250 mL/hr over 120 Minutes Intravenous Every 48 hours 02/04/16 0832 02/04/16 1132   02/02/16 1400  aztreonam (AZACTAM) 1 g in dextrose 5 % 50 mL IVPB  Status:  Discontinued     1 g 100 mL/hr over 30 Minutes Intravenous Every 8 hours 02/02/16 1229 02/05/16 0806   02/01/16 2300  vancomycin (VANCOCIN) 1,250 mg in sodium chloride 0.9 % 250 mL IVPB  Status:  Discontinued     1,250 mg 166.7 mL/hr over 90 Minutes Intravenous Every 24 hours 01/31/16 2133 02/02/16 1229   01/31/16 2200  aztreonam (AZACTAM) 2 g in dextrose 5 % 50 mL IVPB  Status:  Discontinued     2 g 100 mL/hr over 30 Minutes Intravenous Every 8 hours 01/31/16 2119 02/02/16 1229   01/31/16 2200  vancomycin (VANCOCIN) 1,750 mg in sodium chloride 0.9 % 500 mL IVPB     1,750 mg 250 mL/hr over 120 Minutes Intravenous  Once 01/31/16  2133 02/01/16 0113   01/27/16 1800  imipenem-cilastatin (PRIMAXIN) 500 mg in sodium chloride 0.9 % 100 mL IVPB  Status:  Discontinued     500 mg 200 mL/hr over 30 Minutes Intravenous Every 8 hours 01/27/16 1206 01/30/16 1616   01/26/16 1200  imipenem-cilastatin (PRIMAXIN) 250 mg in sodium chloride 0.9 % 100 mL IVPB  Status:  Discontinued     250 mg 200 mL/hr over 30 Minutes Intravenous Every 6 hours 01/26/16 1007 01/27/16 1206   01/25/16 0500  imipenem-cilastatin (PRIMAXIN) 250 mg in sodium chloride 0.9 % 100 mL IVPB  Status:  Discontinued     250 mg 200 mL/hr over 30 Minutes Intravenous Every 12 hours 01/24/16 1817 01/26/16 1007   01/24/16 1700  vancomycin (VANCOCIN) 2,000 mg in sodium chloride 0.9 % 500 mL IVPB     2,000 mg 250 mL/hr over 120 Minutes Intravenous  Once 01/24/16 1628 01/24/16 2038   01/24/16 1700  imipenem-cilastatin (PRIMAXIN) 500 mg in sodium chloride 0.9 % 100 mL IVPB     500 mg 200 mL/hr over 30 Minutes Intravenous  Once 01/24/16 1628 01/24/16 1821        Intake/Output Summary (Last 24 hours) at 02/05/16 1823 Last data filed at 02/05/16 1709  Gross per 24 hour  Intake    500 ml  Output   1250 ml  Net   -750 ml   Filed Weights   02/03/16 0500 02/04/16 0500 02/05/16 0351  Weight: 98.1 kg (216 lb 4.3 oz) 100 kg (220 lb 7.4 oz) 98.2 kg (216 lb 7.9  oz)    Objective: Physical Exam: Filed Vitals:   02/05/16 1500 02/05/16 1502 02/05/16 1549 02/05/16 1600  BP: 148/68   153/75  Pulse: 76 75  77  Temp:    97.9 F (36.6 C)  TempSrc:    Axillary  Resp: 23 16  26   Height:      Weight:      SpO2: 95% 96% 97% 95%    General: Alert, Awake and Disoriented Appear in moderate distress Eyes: PERRL, Conjunctiva normal ENT: Oral Mucosa clear moist. Neck: DA JVD, NO Abnormal Mass Or lumps Cardiovascular: S1 and S2 Present, aortic systolic Murmur, Respiratory: Bilateral Air entry equal and Decreased, bilateral Crackles, no wheezes Abdomen: Bowel Sound present, Soft  and no tenderness Skin:Scrotal ulcer Extremities: no Pedal edema, no calf tenderness Neurologic: Difficult and lethargy as well as disorientation  Data Reviewed: CBC:  Recent Labs Lab 02/02/16 0746 02/04/16 0320 02/05/16 0244  WBC 19.5* 12.5* 12.3*  NEUTROABS  --   --  10.1*  HGB 9.4* 9.1* 9.5*  HCT 32.2* 30.2* 31.1*  MCV 99.7 97.7 94.8  PLT 125* 165 187   Basic Metabolic Panel:  Recent Labs Lab 02/01/16 0537 02/02/16 0746 02/03/16 0858 02/04/16 0320 02/05/16 0244  NA 155* 154* 156* 151* 154*  K 3.8 3.9 4.0 3.7 3.6  CL 124* 125* 127* 122* 122*  CO2 23 20* 22 21* 22  GLUCOSE 236* 158* 216* 329* 188*  BUN 29* 48* 62* 50* 47*  CREATININE 2.19* 3.22* 3.96* 3.18* 3.00*  CALCIUM 7.5* 7.4* 7.4* 7.3* 7.7*  MG  --   --   --   --  2.4  PHOS  --   --   --   --  3.7    Liver Function Tests:  Recent Labs Lab 02/05/16 0244  ALBUMIN 1.6*   No results for input(s): LIPASE, AMYLASE in the last 168 hours. No results for input(s): AMMONIA in the last 168 hours. Coagulation Profile: No results for input(s): INR, PROTIME in the last 168 hours. Cardiac Enzymes: No results for input(s): CKTOTAL, CKMB, CKMBINDEX, TROPONINI in the last 168 hours. BNP (last 3 results) No results for input(s): PROBNP in the last 8760 hours.  CBG:  Recent Labs Lab 02/05/16 0023 02/05/16 0323 02/05/16 0841 02/05/16 1202 02/05/16 1644  GLUCAP 215* 180* 221* 242* 223*    Studies: Dg Chest Port 1 View  02/05/2016  CLINICAL DATA:  Acute respiratory failure EXAM: PORTABLE CHEST 1 VIEW COMPARISON:  02/04/2016 FINDINGS: Improved lung volumes. Persistent bibasilar airspace disease similar to yesterday. No pleural effusion. Pulmonary vascularity appears normal. IMPRESSION: Improved lung volume with persistent bibasilar atelectasis/pneumonia. Electronically Signed   By: Marlan Palauharles  Clark M.D.   On: 02/05/2016 07:04     Scheduled Meds: . antiseptic oral rinse  7 mL Mouth Rinse BID  . aspirin  300 mg  Rectal Daily  . famotidine (PEPCID) IV  20 mg Intravenous Q12H  . heparin  5,000 Units Subcutaneous Q8H  . imipenem-cilastatin  250 mg Intravenous Q6H  . insulin aspart  0-15 Units Subcutaneous Q4H  . insulin glargine  15 Units Subcutaneous Daily  . ipratropium-albuterol  3 mL Nebulization Q6H  . nitroGLYCERIN  0.2 mg Transdermal Daily  . Rivastigmine  13.3 mg Transdermal Daily  . sodium chloride  3 mL Nebulization Q8H   Continuous Infusions:  PRN Meds: acetaminophen (TYLENOL) oral liquid 160 mg/5 mL, acetaminophen, ipratropium-albuterol, RESOURCE THICKENUP CLEAR  Time spent: 30 minutes  Author: Lynden OxfordPranav Patel, MD [facility] Pager:  [phone] 02/05/2016 6:23 PM  If 7PM-7AM, please contact night-coverage at [email or web address], password [facility]

## 2016-02-05 NOTE — Progress Notes (Signed)
Physical Therapy Treatment Patient Details Name: Jared SalvageRonald Runyan MRN: [id number] DOB: [birth year] Today's Date: 02/05/2016    History of Present Illness 75yo male with hx dementia, DM, previous CVA with residual L sided weakness [facility] after a fall at home with L femoral neck fx which was surgically repaired. Post op patient was unable to wean from vent. Pt had progressive renal failure, volume overload and was tx to [facility] 6/10 for ongoing care and renal eval.     PT Comments    Respiratory status more stable today (on HFNC) and did well during EOB activities (SaO2 99-100%). Patient alert with very flat affect, however will respond with short answers, head nods, and smiles. Follows one step directions with delay.   Follow Up Recommendations  SNF     Equipment Recommendations  None recommended by PT    Recommendations for Other Services       Precautions / Restrictions Precautions Precautions: Fall;Anterior Hip Precaution Booklet Issued: No Restrictions Weight Bearing Restrictions: Yes LLE Weight Bearing: Weight bearing as tolerated    Mobility  Bed Mobility Overal bed mobility: Needs Assistance;+2 for physical assistance;+ 2 for safety/equipment Bed Mobility: Rolling;Supine to Sit;Sit to Supine Rolling: Max assist   Supine to sit: Total assist;+2 for physical assistance;+2 for safety/equipment;HOB elevated Sit to supine: Total assist;+2 for physical assistance;+2 for safety/equipment   General bed mobility comments: elevated HOB, however pt with decr ability to process how to use the rail and with following instructions  Transfers                 General transfer comment: Unable   Ambulation/Gait             General Gait Details: unable to stand with PT   Stairs            Wheelchair Mobility    Modified Rankin (Stroke Patients Only)       Balance   Sitting-balance support: Bilateral upper extremity  supported;Feet supported Sitting balance-Leahy Scale: Poor Sitting balance - Comments: at midline with bil UE support; refused to perform reaching activities with concern for falling                            Cognition Arousal/Alertness: Awake/alert Behavior During Therapy: Flat affect Overall Cognitive Status: No family/caregiver present to determine baseline cognitive functioning                      Exercises General Exercises - Lower Extremity Ankle Circles/Pumps: AAROM;Both;10 reps;Supine;Seated (5reps x 2 sets) Long Arc Quad: AROM;Both;10 reps;Seated    General Comments        Pertinent Vitals/Pain Pain Assessment: Faces Faces Pain Scale: Hurts little more Pain Location: Lt hip with ROM Pain Descriptors / Indicators: Grimacing;Operative site guarding Pain Intervention(s): Limited activity within patient's tolerance;Monitored during session;Repositioned    Home Living                      Prior Function            PT Goals (current goals can now be found in the care plan section) Acute Rehab PT Goals Patient Stated Goal: to get better Time For Goal Achievement: 02/11/16 Progress towards PT goals: Progressing toward goals    Frequency  Min 3X/week    PT Plan Current plan remains appropriate    Co-evaluation  End of Session Equipment Utilized During Treatment: Oxygen Activity Tolerance: Patient limited by fatigue Patient left: with call bell/phone within reach;in bed;with bed alarm set     Time: 1343-1406 PT Time Calculation (min) (ACUTE ONLY): 23 min  Charges:  $Therapeutic Exercise: 8-22 mins $Therapeutic Activity: 8-22 mins                    G Codes:      Paizleigh Wilds 02/05/2016, 3:34 PM Pager [phone]

## 2016-02-05 NOTE — Care Management Important Message (Signed)
Important Message  Patient Details  Name: Jared [name] MRN: [id number] Date of Birth: 1941/07/06   Medicare Important Message Given:  Yes    Idabelle Mcpeters T, RN 02/05/2016, 1:00 PM

## 2016-02-05 NOTE — Consult Note (Addendum)
WOC wound consult note Reason for Consult: Consult requested for scrotum wound.   Wound type: Posterior scrotum with full thickness wound; appearance consistent with previous scrotal edema and moisture associated skin damage. Measurement:  3X3X.1cm Wound bed: 20% eschar, 80% red Drainage (amount, consistency, odor) No odor or drainage Periwound: Moist macerated skin with patchy areas of partial thickness skin loss surrounding the wound; appearance consistent with moisture associated skin damage.  Dressing procedure/placement/frequency: This location is very difficult to have a dressing to remain in place. Pt is also frequently incontinent of diarrhea stools and it is difficult to keep location from becomming soiled related to close proximity to rectum.  Xeroform gauze to promote healing and assist with softening nonviable tissue.  If aggressive plan of care is desired, please consult urology for further plan of care. Please re-consult if further assistance is needed.  Thank-you,  Cammie Mcgeeawn Sherman Donaldson MSN, RN, CWOCN, [place]AP, CNS [phone]

## 2016-02-05 NOTE — Progress Notes (Signed)
Assessment:  1 AKI- sl improved, prob hemodynamically mediated with hx of a fall and enalapril on board, exacerbated by hypovolemia-worse 2 Hypernatremia, worsened with diuresis("rock and hard place") 3 ?CHF Rec: Ask Cardiology to give opinion regarding ?CHF. Will hold diuretic today    Subjective: Interval History: Off BiPAP this AM; 3.2 L UOP yesterday  Objective: Vital signs in last 24 hours: Temp:  [97.8 F (36.6 C)-100.6 F (38.1 C)] 97.8 F (36.6 C) (06/22 0800) Pulse Rate:  [82-125] 82 (06/22 0800) Resp:  [15-36] 30 (06/22 0800) BP: (130-148)/(64-84) 140/75 mmHg (06/22 0800) SpO2:  [92 %-100 %] 95 % (06/22 0802) FiO2 (%):  [30 %-40 %] 30 % (06/22 0802) Weight:  [98.2 kg (216 lb 7.9 oz)] 98.2 kg (216 lb 7.9 oz) (06/22 0351) Weight change: -1.8 kg (-3 lb 15.5 oz)  Intake/Output from previous day: 06/21 0701 - 06/22 0700 In: 750 [I.V.:500; IV Piggyback:250] Out: 3225 [Urine:3225] Intake/Output this shift: Total I/O In: -  Out: 300 [Urine:300]  General appearance: alert and mild distress Resp: rales bilaterally Cardio: regular rate and rhythm, S1, S2 normal, no murmur, click, rub or gallop  Lab Results:  Recent Labs  02/04/16 0320 02/05/16 0244  WBC 12.5* 12.3*  HGB 9.1* 9.5*  HCT 30.2* 31.1*  PLT 165 187   BMET:  Recent Labs  02/04/16 0320 02/05/16 0244  NA 151* 154*  K 3.7 3.6  CL 122* 122*  CO2 21* 22  GLUCOSE 329* 188*  BUN 50* 47*  CREATININE 3.18* 3.00*  CALCIUM 7.3* 7.7*   No results for input(s): PTH in the last 72 hours. Iron Studies: No results for input(s): IRON, TIBC, TRANSFERRIN, FERRITIN in the last 72 hours. Studies/Results: [place]as Renal  02/03/2016  CLINICAL DATA:  Acute renal insufficiency; history of diabetes and hypertension. EXAM: RENAL / URINARY TRACT ULTRASOUND COMPLETE COMPARISON:  None in PACs FINDINGS: Right Kidney: Length: 13.1 cm. The renal cortical echotexture is increased as compared to the liver. There is no focal  mass. There is no hydronephrosis. Left Kidney: Length: 12.9 cm. The renal cortical echotexture is mildly increased similar to that on the right. There is no hydronephrosis. Bladder: The partially distended urinary bladder is normal. IMPRESSION: Increased renal cortical echotexture consistent with medical renal disease. There is no hydronephrosis. Electronically Signed   By: David  SwazilandJordan M.D.   On: 02/03/2016 12:01   Dg Chest Port 1 View  02/05/2016  CLINICAL DATA:  Acute respiratory failure EXAM: PORTABLE CHEST 1 VIEW COMPARISON:  02/04/2016 FINDINGS: Improved lung volumes. Persistent bibasilar airspace disease similar to yesterday. No pleural effusion. Pulmonary vascularity appears normal. IMPRESSION: Improved lung volume with persistent bibasilar atelectasis/pneumonia. Electronically Signed   By: Marlan Palauharles  Clark M.D.   On: 02/05/2016 07:04   Dg Chest Port 1 View  02/04/2016  CLINICAL DATA:  Congestive heart failure. EXAM: PORTABLE CHEST 1 VIEW COMPARISON:  Radiograph of February 02, 2016. FINDINGS: Stable cardiomegaly. No pneumothorax is noted. No significant pleural effusion is noted. Stable bibasilar opacities are noted concerning for subsegmental atelectasis or possibly infiltrates. Bony thorax is unremarkable. IMPRESSION: Stable bibasilar opacities are noted concerning for subsegmental atelectasis or possibly infiltrates. Electronically Signed   By: Lupita RaiderJames  Green Jr, M.D.   On: 02/04/2016 10:19    Scheduled: . antiseptic oral rinse  7 mL Mouth Rinse BID  . aspirin  300 mg Rectal Daily  . famotidine (PEPCID) IV  20 mg Intravenous Q12H  . heparin  5,000 Units Subcutaneous Q8H  . imipenem-cilastatin  250  mg Intravenous Q6H  . insulin aspart  0-15 Units Subcutaneous Q4H  . insulin glargine  15 Units Subcutaneous Daily  . ipratropium-albuterol  3 mL Nebulization Q6H  . nitroGLYCERIN  0.2 mg Transdermal Daily  . Rivastigmine  13.3 mg Transdermal Daily  . sodium chloride  3 mL Nebulization Q8H     LOS: 12 days   Marjory Meints C 02/05/2016,10:12 AM

## 2016-02-05 NOTE — Plan of Care (Signed)
Problem: SLP Dysphagia Goals Goal: Patient will demonstrate readiness for PO's Patient will demonstrate readiness for PO's and/or instrumental swallow study as evidenced by: Ability to maintain adequate respirations during po intake with min assist

## 2016-02-05 NOTE — Progress Notes (Signed)
Pharmacy Antibiotic Note  Jared [name] is a 75 y.o. male admitted on 01/24/2016 with acute respiratory failure and AKI. Pharmacy consulted to broaden back to imipenem for HCAP. Tmax/24h 100.6, wbc stable 12.3, SCr down to 3.  Plan: Imipenem 250mg  IV q6h Will continue to follow renal function, culture results, LOT, and antibiotic de-escalation plans   Height: 5\' 10"  (177.8 cm) Weight: 216 lb 7.9 oz (98.2 kg) IBW/kg (Calculated) : 73  Temp (24hrs), Avg:99.6 F (37.6 C), Min:98.3 F (36.8 C), Max:100.6 F (38.1 C)   Recent Labs Lab 02/01/16 0537 02/02/16 0746 02/02/16 2230 02/03/16 0858 02/04/16 0320 02/04/16 0330 02/05/16 0244  WBC  --  19.5*  --   --  12.5*  --  12.3*  CREATININE 2.19* 3.22*  --  3.96* 3.18*  --  3.00*  VANCORANDOM  --   --  38  --   --  21  --     Estimated Creatinine Clearance: 26.2 mL/min (by C-G formula based on Cr of 3).    Allergies  Allergen Reactions  . Penicillins Hives    Early childhood reaction - no other information available    Antimicrobials this admission: Vanc x 1 6/10 Primaxin 6/10>> 6/17; 6/22 Vanc 6/17 >>6/21 Aztreonam 6/17 >>6/22  Dose adjustments this admission: 6/19 PM VR (prior to 3rd dose): 38 (continue to hold) 6/20 VR: 21  Microbiology results: 6/10 BCx: ngf 6/11 UC: ngf 6/11 mrsa pcr: neg 6/17 BCx: ngtd   Jared [name], PharmD, BCPS Clinical Pharmacist Pager [phone] 02/05/2016 8:17 AM

## 2016-02-05 NOTE — Progress Notes (Signed)
Speech Language Pathology Treatment: Dysphagia  Patient Details Name: Jared [name] MRN: [id number] DOB: August 27, 1940 Today's Date: 02/05/2016 Time: [id number] SLP Time Calculation (min) (ACUTE ONLY): 8 min  Assessment / Plan / Recommendation Clinical Impression   Pt continues with high respiratory needs, currently on HFNC with increased WOB with activity. Limited po trials provided to assess for ability to orally transit bolus and initiate a swallow. Oral phase appears intact however hyo-laryngeal movement sluggish and patient with consistent cough post swallow. Difficult to discern source of cough at bedside, aspiration vs triggered by apneic phase and significant chest congestion, however extend of respiratory deficit and overall lethargy increase aspiration risk significantly.   Continue to recommend NPO. Will f/u. May wish to consider temporary non-oral means of nutrition. Prognosis for ability to resume pos good with improved respiratory function.            HPI HPI: 75 yo male with hx dementia, DM, previous CVA with residual L sided weakness [facility] after a fall at home with L femoral neck fx which was surgically repaired. Post op patient was unable to wean from vent. Pt had progressive renal failure, volume overload and was tx to [facility] 6/10 for ongoing care and renal eval. Intubated 6/10-6/12.  Swallow evaluation 6/13 with recs for dysphagia 1, thin liquids and anticipation of D/C on same diet. On 6/17 patient became febrile with possible aspiration pneumonia and on 6/18 became septic and went into acute hypoxic respiratory failure requiting NRB and transfer to stepdown unit. New orders for swallow evaluation 6/20 given concerns for aspiration.       SLP Plan  Continue with current plan of care     Recommendations  Diet recommendations: NPO Medication Administration: Via alternative means            Oral Care Recommendations: Oral care QID Follow up  Recommendations: Skilled Nursing facility Plan: Continue with current plan of care     GO             [facility] Jared [name] [place] MA, CCC-SLP [phone]    Jared [name] Jared 02/05/2016, 11:15 AM

## 2016-02-06 ENCOUNTER — Inpatient Hospital Stay (HOSPITAL_COMMUNITY): Payer: Medicare Other

## 2016-02-06 DIAGNOSIS — R1310 Dysphagia, unspecified: Secondary | ICD-10-CM

## 2016-02-06 DIAGNOSIS — J9601 Acute respiratory failure with hypoxia: Secondary | ICD-10-CM | POA: Insufficient documentation

## 2016-02-06 LAB — GLUCOSE, CAPILLARY
GLUCOSE-CAPILLARY: 139 mg/dL — AB (ref 65–99)
GLUCOSE-CAPILLARY: 190 mg/dL — AB (ref 65–99)
GLUCOSE-CAPILLARY: 155 mg/dL — AB (ref 65–99)
Glucose-Capillary: 149 mg/dL — ABNORMAL HIGH (ref 65–99)
Glucose-Capillary: 152 mg/dL — ABNORMAL HIGH (ref 65–99)

## 2016-02-06 LAB — CBC
HCT: 32.4 % — ABNORMAL LOW (ref 39.0–52.0)
Hemoglobin: 9.6 g/dL — ABNORMAL LOW (ref 13.0–17.0)
MCH: 28.7 pg (ref 26.0–34.0)
MCHC: 29.6 g/dL — ABNORMAL LOW (ref 30.0–36.0)
MCV: 97 fL (ref 78.0–100.0)
PLATELETS: 225 10*3/uL (ref 150–400)
RBC: 3.34 MIL/uL — ABNORMAL LOW (ref 4.22–5.81)
RDW: 17.1 % — AB (ref 11.5–15.5)
WBC: 13.4 10*3/uL — AB (ref 4.0–10.5)

## 2016-02-06 LAB — RENAL FUNCTION PANEL
Albumin: 1.6 g/dL — ABNORMAL LOW (ref 3.5–5.0)
Anion gap: 5 (ref 5–15)
BUN: 41 mg/dL — ABNORMAL HIGH (ref 6–20)
CALCIUM: 7.9 mg/dL — AB (ref 8.9–10.3)
CHLORIDE: 130 mmol/L — AB (ref 101–111)
CO2: 24 mmol/L (ref 22–32)
CREATININE: 2.62 mg/dL — AB (ref 0.61–1.24)
GFR calc Af Amer: 26 mL/min — ABNORMAL LOW (ref >60)
GFR, EST NON AFRICAN AMERICAN: 23 mL/min — AB (ref >60)
Glucose, Bld: 142 mg/dL — ABNORMAL HIGH (ref 65–99)
Phosphorus: 3.3 mg/dL (ref 2.5–4.6)
Potassium: 3.3 mmol/L — ABNORMAL LOW (ref 3.5–5.1)
SODIUM: 159 mmol/L — AB (ref 135–145)

## 2016-02-06 LAB — BASIC METABOLIC PANEL
BUN: 42 mg/dL — ABNORMAL HIGH (ref 6–20)
CO2: 23 mmol/L (ref 22–32)
Calcium: 7.8 mg/dL — ABNORMAL LOW (ref 8.9–10.3)
Creatinine, Ser: 2.35 mg/dL — ABNORMAL HIGH (ref 0.61–1.24)
GFR calc Af Amer: 30 mL/min — ABNORMAL LOW (ref >60)
GFR calc non Af Amer: 26 mL/min — ABNORMAL LOW (ref >60)
Glucose, Bld: 161 mg/dL — ABNORMAL HIGH (ref 65–99)
POTASSIUM: 3.7 mmol/L (ref 3.5–5.1)
Sodium: 161 mmol/L (ref 135–145)

## 2016-02-06 MED ORDER — GLUCERNA 1.2 CAL PO LIQD
1000.0000 mL | ORAL | Status: DC
  Administered 2016-02-06 – 2016-02-09 (×4): 1000 mL
  Filled 2016-02-06 (×8): qty 1000

## 2016-02-06 MED ORDER — JEVITY 1.2 CAL PO LIQD: 1000.0000 mL | ORAL | Status: DC

## 2016-02-06 MED ORDER — POTASSIUM CHLORIDE 20 MEQ/15ML (10%) PO SOLN
40.0000 meq | Freq: Every day | ORAL | Status: DC
  Administered 2016-02-06 – 2016-02-10 (×5): 40 meq via ORAL
  Filled 2016-02-06 (×5): qty 30

## 2016-02-06 MED ORDER — FREE WATER
100.0000 mL | Freq: Four times a day (QID) | Status: DC
  Administered 2016-02-06 (×2): 100 mL

## 2016-02-06 MED ORDER — FREE WATER
200.0000 mL | Status: DC
  Administered 2016-02-06 – 2016-02-09 (×16): 200 mL

## 2016-02-06 MED ORDER — DEXTROSE 5 % IV SOLN
INTRAVENOUS | Status: DC
  Administered 2016-02-07 – 2016-02-09 (×4): via INTRAVENOUS

## 2016-02-06 MED ORDER — WHITE PETROLATUM GEL
Status: AC
  Administered 2016-02-06: 0.2
  Filled 2016-02-06: qty 1

## 2016-02-06 NOTE — Progress Notes (Signed)
Assessment:  1 AKI- sl improved, prob hemodynamically mediated with hx of a fall and enalapril on board, exacerbated by hypovolemia-worse 2 Hypernatremia, worsened("rock and hard place") 3 ?CHF v asp PNA Rec: Add D5W & TF with free water  Subjective: Interval History: Brisk UOP  Objective: Vital signs in last 24 hours: Temp:  [97.7 F (36.5 C)-98.4 F (36.9 C)] 98.4 F (36.9 C) (06/23 0752) Pulse Rate:  [75-138] 100 (06/23 0752) Resp:  [16-33] 23 (06/23 0752) BP: (121-153)/(68-99) 121/84 mmHg (06/23 0752) SpO2:  [92 %-100 %] 100 % (06/23 0940) FiO2 (%):  [40 %-50 %] 40 % (06/23 0940) Weight:  [96.2 kg (212 lb 1.3 oz)] 96.2 kg (212 lb 1.3 oz) (06/23 0359) Weight change: -2 kg (-4 lb 6.6 oz)  Intake/Output from previous day: 06/22 0701 - 06/23 0700 In: 550 [IV Piggyback:550] Out: 1525 [Urine:1525] Intake/Output this shift:    Resp: rales on left  Abd soft Alert & awake More comfortable today  Lab Results:  Recent Labs  02/05/16 0244 02/06/16 0340  WBC 12.3* 13.4*  HGB 9.5* 9.6*  HCT 31.1* 32.4*  PLT 187 225   BMET:  Recent Labs  02/05/16 0244 02/06/16 0345  NA 154* 159*  K 3.6 3.3*  CL 122* 130*  CO2 22 24  GLUCOSE 188* 142*  BUN 47* 41*  CREATININE 3.00* 2.62*  CALCIUM 7.7* 7.9*   No results for input(s): PTH in the last 72 hours. Iron Studies: No results for input(s): IRON, TIBC, TRANSFERRIN, FERRITIN in the last 72 hours. Studies/Results: Dg Chest Port 1 View  02/05/2016  CLINICAL DATA:  Acute respiratory failure EXAM: PORTABLE CHEST 1 VIEW COMPARISON:  02/04/2016 FINDINGS: Improved lung volumes. Persistent bibasilar airspace disease similar to yesterday. No pleural effusion. Pulmonary vascularity appears normal. IMPRESSION: Improved lung volume with persistent bibasilar atelectasis/pneumonia. Electronically Signed   By: Marlan Palauharles  Clark M.D.   On: 02/05/2016 07:04    Scheduled: . antiseptic oral rinse  7 mL Mouth Rinse BID  . aspirin  300 mg  Rectal Daily  . famotidine (PEPCID) IV  20 mg Intravenous Q12H  . free water  100 mL Per Tube Q6H  . heparin  5,000 Units Subcutaneous Q8H  . imipenem-cilastatin  250 mg Intravenous Q6H  . insulin aspart  0-15 Units Subcutaneous Q4H  . insulin glargine  15 Units Subcutaneous Daily  . ipratropium-albuterol  3 mL Nebulization Q6H  . nitroGLYCERIN  0.2 mg Transdermal Daily  . Rivastigmine  13.3 mg Transdermal Daily  . sodium chloride  3 mL Nebulization Q8H      LOS: 13 days   Beyonka Pitney C 02/06/2016,12:06 PM

## 2016-02-06 NOTE — Progress Notes (Signed)
ABD xray done to check Cortrak placement. Tube in gastric antrum - talked to Radiologist that read xray and he suggested that tube need to be advanced than have another xray to confirm placement - Paged Cortrak nurse for advancement

## 2016-02-06 NOTE — Progress Notes (Signed)
OT Cancellation Note  Patient Details Name: Jared SalvageRonald [name] MRN: [id number] DOB: 14-Oct-1940   Cancelled Treatment:    Reason Eval/Treat Not Completed: Patient at procedure or test/ unavailable. Pt having xray. Will attempt later.   [facility] AscWARD,HILLARY  [name], OTR/L  [phone] 02/06/2016 02/06/2016, 5:04 PM

## 2016-02-06 NOTE — Progress Notes (Signed)
Daily Progress Note   Patient Name: Marrian SalvageRonald Sienkiewicz       Date: 02/06/2016 DOB: 1940/09/08  Age: 75 y.o. MRN#: [id number] Attending Physician: Rolly SalterPranav M Patel, MD Primary Care Physician: [name],GREG, MD Admit Date: 01/24/2016  Reason for Consultation/Follow-up: Establishing goals of care and Psychosocial/spiritual support  Subjective: Family still in the hopeful, watchful waiting mode. There are still having a great deal of trouble absorbing how rapid decline he has had after being relatively independent at home prior to falling on 01/21/2016. His wife and daughter are at the bedside. They state that they as well as their son French Anaracy were present this morning for updates from the primary physician. He continues on high flow oxygen. They do seem to understand that this device anchors him to an intensive care environment and that he would have to be weaned from this. They did agree to an NG tube for nourishment this morning which was placed and started. In reviewing patient's chart his albumin on 02/06/2016 was 1.6; on 01/24/2016 it was 2.5. His creatinine is improving, now 2.6 to but he is becoming hypernatremic. His sodium today is 159. Family at bedside and I did discuss dementia and how this factors into his fall as well as his other symptoms of decline.  Length of Stay: 13  Current Medications: Scheduled Meds:  . antiseptic oral rinse  7 mL Mouth Rinse BID  . aspirin  300 mg Rectal Daily  . famotidine (PEPCID) IV  20 mg Intravenous Q12H  . free water  100 mL Per Tube Q6H  . heparin  5,000 Units Subcutaneous Q8H  . imipenem-cilastatin  250 mg Intravenous Q6H  . insulin aspart  0-15 Units Subcutaneous Q4H  . insulin glargine  15 Units Subcutaneous Daily  . ipratropium-albuterol  3 mL Nebulization Q6H   . nitroGLYCERIN  0.2 mg Transdermal Daily  . potassium chloride  40 mEq Oral Daily  . Rivastigmine  13.3 mg Transdermal Daily  . sodium chloride  3 mL Nebulization Q8H    Continuous Infusions: . dextrose 50 mL (02/06/16 1300)  . feeding supplement (GLUCERNA 1.2 CAL)      PRN Meds: acetaminophen (TYLENOL) oral liquid 160 mg/5 mL, acetaminophen, ipratropium-albuterol, RESOURCE THICKENUP CLEAR  Physical Exam  Constitutional: He appears well-developed.  Acutely ill appearing man  Cardiovascular:  irrg  Pulmonary/Chest:  On hi flow 02  Abdominal: He exhibits distension.  Genitourinary:  foley  Neurological:  Spontaneously awakens; does not follow commands. Confused  Skin: Skin is warm.  Psychiatric:  Agitated, confused. Wearing mitten to right hand  Nursing note and vitals reviewed.           Vital Signs: BP 138/71 mmHg  Pulse 85  Temp(Src) 98.3 F (36.8 C) (Axillary)  Resp 32  Ht  (1.778 m)  Wt 96.2 kg (212 lb 1.3 oz)  BMI 30.43 kg/m2  SpO2 98% SpO2: SpO2: 98 % O2 Device: O2 Device: High Flow Nasal Cannula O2 Flow Rate: O2 Flow Rate (L/min): 30 L/min (Weaned to 30L. Pt tolerating it well.)  Intake/output summary:  Intake/Output Summary (Last 24 hours) at 02/06/16 1724 Last data filed at 02/06/16 0500  Gross per 24 hour  Intake    300 ml  Output    900 ml  Net   -600 ml   LBM: Last BM Date: 02/05/16 Baseline Weight: Weight: 101.2 kg (223 lb 1.7 oz) Most recent weight: Weight: 96.2 kg (212 lb 1.3 oz)       Palliative Assessment/Data:      Patient Active Problem List   Diagnosis Date Noted  . DNR (do not resuscitate)   . Palliative care encounter   . Dysphagia   . Uncontrolled diabetes mellitus with hyperglycemia, with long-term current use of insulin (HCC) 01/28/2016  . Hypernatremia 01/28/2016  . Acute kidney injury (HCC) 01/28/2016  . Acute respiratory failure with hypoxia (HCC) 01/28/2016  . HCAP (healthcare-associated pneumonia)  01/28/2016  . 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 Acute respiratory failure (HCC) 01/24/2016  . Diabetes mellitus 03/25/2011  . Hearing loss 03/25/2011  . GERD (gastroesophageal reflux disease) 03/25/2011  . High blood pressure 03/25/2011  . Anxiety 03/25/2011    Palliative Care Assessment & Plan   Patient Profile: Patient is a 75 year old man admitted 01/24/2016 with a previous medical history of hypertension, GERD, anxiety, hearing loss, diabetes, history of stroke, history of MI, dementia as well as associated slow physical and functional decline associated with his prior CVA and dementia. He was recently admitted to [facility] on 01/21/2016 after a fall at home where he sustained a left femoral neck fracture which was surgically repaired. Postop patient was unable to wean from the vent. He has had progressive renal failure, volume overload and was transferred to [facility] on 01/24/2016 for ongoing care and renal evaluation  Assessment: Older man who appears delirious. He has a fluctuating level of consciousness, where he will sometimes spontaneously awaken; his speech is unintelligible at times, he cannot follow commands. He is sleeping the majority of the day and is not eating or drinking. He continues on high flow O2 and had an NG tube placed today  Recommendations/Plan:  We'll continue to support the family where they are in terms of hopeful, watchful waiting over the next couple of days  Family is slowly but beginning to grasp the severity of this fall with associated decline particularly under the umbrella of a previous history of stroke and dementia  This family will need ongoing goals of care discussion as we see patients clinical course palliative  Palliative medicine team to remain actively involved  Goals of Care and Additional Recommendations:  Limitations on Scope of Treatment: Full Scope Treatment  Code Status:    Code Status Orders        Start     Ordered    01/24/16 1919  Do  not attempt resuscitation (DNR)   Continuous     01/24/16 1918    Code Status History    Date Active Date Inactive Code Status Order ID Comments User Context   01/24/2016  4:15 PM 01/24/2016  7:18 PM Full Code [id number]  [name], NP Inpatient       Prognosis:   < 6 months but likely sooner in the setting of dementia now with a sentinel event of a hip fracture, not returning to baseline with acute respiratory failure, pneumonia now on high flow oxygen. Patient is high risk for an acute cardiopulmonary event which could take his life  Discharge Planning:  To Be Determined  Offered to call and update patient's son, but spouse reported that he had been present for medical providers discussion this morning. Palliative medicine team will continue to round over the weekend to monitor patient's clinical condition  Thank you for allowing the Palliative Medicine Team to assist in the care of this patient.   Time In: 1300 Time Out: 1340 Total Time 40 min Prolonged Time Billed  no       Greater than 50%  of this time was spent counseling and coordinating care related to the above assessment and plan.  Irean HongSarah Grace Jerrel Tiberio, NP  Please contact Palliative Medicine Team phone at [phone] for questions and concerns.

## 2016-02-06 NOTE — Progress Notes (Signed)
Chaplain visited with family for awhile...not sure with family has understood or accepted the Pt's medical condition. Chaplain spoke with family about faith tradition and support systems. Family and Pt has a really active church.  Chaplain will continue to check in on this Pt. And his family.

## 2016-02-06 NOTE — Care Management Important Message (Signed)
Important Message  Patient Details  Name: Jared [name] MRN: [id number] Date of Birth: 10/23/1940   Medicare Important Message Given:  Yes    Kyla BalzarineShealy, Javoris Star Abena 02/06/2016, 10:23 AM

## 2016-02-06 NOTE — Progress Notes (Signed)
[facility] Progress Note  Patient: Jared [name] JYN:[id number]   PCP: [name],GREG, MD DOB: 1941/08/11   DOA: 01/24/2016   DOS: 02/06/2016   Date of Service: the patient was seen and examined on 02/06/2016  Subjective: The patient continues to have significant respiratory distress and shortness of breath. Minimal to tolerate anything orally. Nutrition: Remains nothing by mouth until speech evaluation  Brief hospital course: Pt. with PMH of CVA with residual left-sided weakness, dementia, diabetes mellitus; admitted on 01/24/2016, with complaint of ventilator dependent respiratory failure, patient was initially [facility] in [facility] on 01/21/2016 after a fall at home with the left humeral neck fracture. Postoperatively the patient was unable to be extubated and had progressive renal failure with volume overload and therefore was [facility] 16 2017. Patient received IV antibiotics and with improvement in oxygenation and he was extubated on 01/26/2016. Patient was transferred to hospitalist service and completed antibiotic course on 01/30/2016. 01/31/2016 the patient become febrile with worsening oxygenation with acute hypoxic respiratory failure and was transferred to step down unit on 02/01/2016 with BiPAP support. Currently further plan is continue BiPAP support.  Assessment and Plan: 1. Acute respiratory failure with hypoxia (HCC) Sepsis with healthcare associated pneumonia. Patient completed 7 days of IV antibiotics, initially was able to be extubated. With worsening respiratory status transferred back to step down unit. Continuing BiPAP as needed, continue high flow nasal cannula at present. Switching aztreonam to Primaxin. MRSA negative,  blood cultures negative, urine culture negative. Prognosis is guarded. Patient was started on IV fluids for acute kidney injury and appears to have developed volume overload on 02/04/2016 and a IV Lasix 1 per nephrology.  At present no further plan for IV Lasix due to increase in the sodium level.  2. Acute kidney injury. Hypernatremia. Appreciate nephrology consultation. Probably hemodynamically mediated with hypotension as well as enalapril. Worsen with postoperative hypovolemia. Was treated with IV fluids. Sodium improved with IV fluids as well. At appears that the patient has developed volume overload on 02/04/2016 and given Lasix and she was started again on D5 IV fluids will use free water with 2 feeding.  3. Diabetes mellitus with hyperglycemia. Patient currently nothing by mouth with concern of aspiration. Continue sliding scale coverage at present.  4. Dysphagia. Protein calorie malnutrition in the context of critical illness. Speech therapy unable to evaluate the patient due to respiratory status. Persistently remains nothing by mouth at present. Palliative care consulted. Family wants to attempt feeding tube. We will get nasogastric tube insertion. And start the patient on every feeding.  5. Left femoral neck fracture. Surgically repaired on 01/21/2016. Continue heparin for DVT prophylaxis. PT recommends SNF.  6. History of CVA with right-sided weakness. At on Aggrenox but due to her nothing by mouth status we will use suppository aspirin.  7. Vascular dementia. Holding Namenda due to nothing by mouth status. Continue patch. Also holding Seroquel for depression.  8. Scrotal ulcer. From severe edema. Also worsening from diarrhea. Discuss with family regarding possible options and at present I would like to hold off on urology consultation for surgical debridement. Use air mattress.  9. Goals of care discussion. Prognosis is guarded for the patient. Discussed with patient's family regarding patient's guarded prognosis as well as worsening respiratory status. Also discussed regarding difficulty improving patient's sodium as well as renal function as well as regaining adequate  nutrition with future risk for aspiration. Palliative care was going to consult with patient's son as well as wife  at bedside, plan is to continue current care  Pain management: When necessary Tylenol Activity: SNF as per physical therapy Bowel regimen: last BM 02/04/2016 Diet: Nothing by mouth DVT Prophylaxis: subcutaneous Heparin  Advance goals of care discussion: DNR/DNI as per earlier discussion  Family Communication: Family was at bedside and all questions were answered satisfactorily.   Disposition:  At present guarded prognosis. We will await improvement in clinical condition before disposition.  Consultants: Critical care, nephrology Procedures: Extubation Echocardiogram  Antibiotics: Anti-infectives    Start     Dose/Rate Route Frequency Ordered Stop   02/05/16 0830  imipenem-cilastatin (PRIMAXIN) 250 mg in sodium chloride 0.9 % 100 mL IVPB     250 mg 200 mL/hr over 30 Minutes Intravenous Every 6 hours 02/05/16 0818     02/04/16 0845  vancomycin (VANCOCIN) 2,000 mg in sodium chloride 0.9 % 500 mL IVPB  Status:  Discontinued     2,000 mg 250 mL/hr over 120 Minutes Intravenous Every 48 hours 02/04/16 0832 02/04/16 1132   02/02/16 1400  aztreonam (AZACTAM) 1 g in dextrose 5 % 50 mL IVPB  Status:  Discontinued     1 g 100 mL/hr over 30 Minutes Intravenous Every 8 hours 02/02/16 1229 02/05/16 0806   02/01/16 2300  vancomycin (VANCOCIN) 1,250 mg in sodium chloride 0.9 % 250 mL IVPB  Status:  Discontinued     1,250 mg 166.7 mL/hr over 90 Minutes Intravenous Every 24 hours 01/31/16 2133 02/02/16 1229   01/31/16 2200  aztreonam (AZACTAM) 2 g in dextrose 5 % 50 mL IVPB  Status:  Discontinued     2 g 100 mL/hr over 30 Minutes Intravenous Every 8 hours 01/31/16 2119 02/02/16 1229   01/31/16 2200  vancomycin (VANCOCIN) 1,750 mg in sodium chloride 0.9 % 500 mL IVPB     1,750 mg 250 mL/hr over 120 Minutes Intravenous  Once 01/31/16 2133 02/01/16 0113   01/27/16 1800   imipenem-cilastatin (PRIMAXIN) 500 mg in sodium chloride 0.9 % 100 mL IVPB  Status:  Discontinued     500 mg 200 mL/hr over 30 Minutes Intravenous Every 8 hours 01/27/16 1206 01/30/16 1616   01/26/16 1200  imipenem-cilastatin (PRIMAXIN) 250 mg in sodium chloride 0.9 % 100 mL IVPB  Status:  Discontinued     250 mg 200 mL/hr over 30 Minutes Intravenous Every 6 hours 01/26/16 1007 01/27/16 1206   01/25/16 0500  imipenem-cilastatin (PRIMAXIN) 250 mg in sodium chloride 0.9 % 100 mL IVPB  Status:  Discontinued     250 mg 200 mL/hr over 30 Minutes Intravenous Every 12 hours 01/24/16 1817 01/26/16 1007   01/24/16 1700  vancomycin (VANCOCIN) 2,000 mg in sodium chloride 0.9 % 500 mL IVPB     2,000 mg 250 mL/hr over 120 Minutes Intravenous  Once 01/24/16 1628 01/24/16 2038   01/24/16 1700  imipenem-cilastatin (PRIMAXIN) 500 mg in sodium chloride 0.9 % 100 mL IVPB     500 mg 200 mL/hr over 30 Minutes Intravenous  Once 01/24/16 1628 01/24/16 1821        Intake/Output Summary (Last 24 hours) at 02/06/16 1827 Last data filed at 02/06/16 1800  Gross per 24 hour  Intake    750 ml  Output    900 ml  Net   -150 ml   Filed Weights   02/04/16 0500 02/05/16 0351 02/06/16 0359  Weight: 100 kg (220 lb 7.4 oz) 98.2 kg (216 lb 7.9 oz) 96.2 kg (212 lb 1.3 oz)    Objective:  Physical Exam: Filed Vitals:   02/06/16 1454 02/06/16 1500 02/06/16 1600 02/06/16 1700  BP:  133/69 138/71 130/73  Pulse:  83 85 86  Temp:   98.3 F (36.8 C)   TempSrc:   Axillary   Resp:  26 32 28  Height:      Weight:      SpO2: 99% 98% 98% 97%    General: Alert, Awake and Disoriented Appear in moderate distress Eyes: PERRL, Conjunctiva normal ENT: Oral Mucosa clear moist. Neck: DA JVD, NO Abnormal Mass Or lumps Cardiovascular: S1 and S2 Present, aortic systolic Murmur, Respiratory: Bilateral Air entry equal and Decreased, bilateral Crackles, no wheezes Abdomen: Bowel Sound present, Soft and no tenderness Skin:Scrotal  ulcer Extremities: no Pedal edema, no calf tenderness Neurologic: Difficult and lethargy as well as disorientation  Data Reviewed: CBC:  Recent Labs Lab 02/02/16 0746 02/04/16 0320 02/05/16 0244 02/06/16 0340  WBC 19.5* 12.5* 12.3* 13.4*  NEUTROABS  --   --  10.1*  --   HGB 9.4* 9.1* 9.5* 9.6*  HCT 32.2* 30.2* 31.1* 32.4*  MCV 99.7 97.7 94.8 97.0  PLT 125* 165 187 225   Basic Metabolic Panel:  Recent Labs Lab 02/02/16 0746 02/03/16 0858 02/04/16 0320 02/05/16 0244 02/06/16 0345  NA 154* 156* 151* 154* 159*  K 3.9 4.0 3.7 3.6 3.3*  CL 125* 127* 122* 122* 130*  CO2 20* 22 21* 22 24  GLUCOSE 158* 216* 329* 188* 142*  BUN 48* 62* 50* 47* 41*  CREATININE 3.22* 3.96* 3.18* 3.00* 2.62*  CALCIUM 7.4* 7.4* 7.3* 7.7* 7.9*  MG  --   --   --  2.4  --   PHOS  --   --   --  3.7 3.3    Liver Function Tests:  Recent Labs Lab 02/05/16 0244 02/06/16 0345  ALBUMIN 1.6* 1.6*   No results for input(s): LIPASE, AMYLASE in the last 168 hours. No results for input(s): AMMONIA in the last 168 hours. Coagulation Profile: No results for input(s): INR, PROTIME in the last 168 hours. Cardiac Enzymes: No results for input(s): CKTOTAL, CKMB, CKMBINDEX, TROPONINI in the last 168 hours. BNP (last 3 results) No results for input(s): PROBNP in the last 8760 hours.  CBG:  Recent Labs Lab 02/05/16 2056 02/05/16 2330 02/06/16 0323 02/06/16 0750 02/06/16 1307  GLUCAP 158* 124* 139* 149* 152*    Studies: Dg Abd Portable 1v  02/06/2016  CLINICAL DATA:  Feeding tube placement EXAM: PORTABLE ABDOMEN - 1 VIEW COMPARISON:  Portable exam 1605 hours compared to 1339 hours FINDINGS: Tip of feeding tube projects over approximately the junction of the second and third portions of the duodenum. Bowel gas pattern normal. Bones demineralized. Surgical clips RIGHT upper quadrant question cholecystectomy. IMPRESSION: Tip of feeding tube projects over approximately the junction of the second and  third portions of the duodenum. Electronically Signed   By: Ulyses Southward M.D.   On: 02/06/2016 16:12   Dg Abd Portable 1v  02/06/2016  CLINICAL DATA:  Nasogastric tube placement. EXAM: PORTABLE ABDOMEN - 1 VIEW COMPARISON:  Chest radiograph of 1 day prior FINDINGS: Feeding tube terminates at the gastric antrum. Cholecystectomy. Bibasilar airspace disease with right hemidiaphragm elevation. IMPRESSION: Feeding tube terminating at the gastric antrum. Electronically Signed   By: Jeronimo Greaves M.D.   On: 02/06/2016 13:48     Scheduled Meds: . antiseptic oral rinse  7 mL Mouth Rinse BID  . aspirin  300 mg Rectal Daily  . famotidine (PEPCID)  IV  20 mg Intravenous Q12H  . free water  100 mL Per Tube Q6H  . heparin  5,000 Units Subcutaneous Q8H  . imipenem-cilastatin  250 mg Intravenous Q6H  . insulin aspart  0-15 Units Subcutaneous Q4H  . insulin glargine  15 Units Subcutaneous Daily  . ipratropium-albuterol  3 mL Nebulization Q6H  . nitroGLYCERIN  0.2 mg Transdermal Daily  . potassium chloride  40 mEq Oral Daily  . Rivastigmine  13.3 mg Transdermal Daily  . sodium chloride  3 mL Nebulization Q8H   Continuous Infusions: . dextrose 50 mL (02/06/16 1300)  . feeding supplement (GLUCERNA 1.2 CAL) 1,000 mL (02/06/16 1800)   PRN Meds: acetaminophen (TYLENOL) oral liquid 160 mg/5 mL, acetaminophen, ipratropium-albuterol, RESOURCE THICKENUP CLEAR  Time spent: 30 minutes  Author: Lynden OxfordPranav Kaari Zeigler, MD [facility] Pager: [phone] 02/06/2016 6:27 PM  If 7PM-7AM, please contact night-coverage at [email or web address], password [email or web address]

## 2016-02-06 NOTE — Progress Notes (Signed)
CRITICAL VALUE ALERT  Critical value received:  Na-161 & Chloride- >130  Date of notification:  02/06/16  Time of notification:  2009  Critical value read back: yes  Nurse who received alert:  Kizzie BaneAmy Jovane Foutz, RN  MD notified (1st page):  Claiborne Billingsallahan  Time of first page:  2008  Responding MD:  Claiborne Billingsallahan  Time MD responded:  2015

## 2016-02-06 NOTE — Progress Notes (Signed)
SLP Cancellation Note  Patient Details Name: Marrian SalvageRonald Knoke MRN: [id number] DOB: 12/08/1940   Cancelled treatment:       Reason Eval/Treat Not Completed: NG just placed; pt on HFNC; RR high 30s. Pt remains high aspiration risk. Recommend continued NPO - further discussion with Palliative Medicine is pending.  SLP will f/u on Monday for GOC.   Blenda MountsCouture, Kemon Devincenzi Laurice 02/06/2016, 1:35 PM

## 2016-02-06 NOTE — Progress Notes (Signed)
Nutrition Consult/Follow Up  DOCUMENTATION CODES:   Obesity unspecified  INTERVENTION:    Once CORTRAK tube placed & verified, initiate Glucerna 1.2 formula at 25 ml/hr and increase by 10 ml every 4 hours to goal rate of 75 ml/hr  Above TF regimen to provide 2160 kcals, 108 gm protein, 1449 ml of free water  NUTRITION DIAGNOSIS:   Inadequate oral intake related to inability to eat as evidenced by NPO status, ongoing  GOAL:   Patient will meet greater than or equal to 90% of their needs, currently unmet  MONITOR:   TF tolerance, Diet advancement, Labs, Weight trends, I & O's  ASSESSMENT:   75yo male with hx dementia, DM, previous CVA with residual L sided weakness initially admitted to [facility] 6/7 after a fall at home with L femoral neck fx which was surgically repaired. Post op patient was unable to wean from vent. Pt had progressive renal failure, volume overload and was tx to [facility] 6/10 for ongoing care and renal eval.   Patient extubated 6/12. Pt now NPO >> s/p bedside swallow evaluation 6/20. Previously on a Dys 1-nectar thick liquid diet and receiving Glucerna Shake TID. Palliative Care Team following >> note reviewed 6/22. RD consulted to initiate & manage TF >> CORTRAK tube placement pending.  Diet Order:  Diet NPO time specified  Skin:  Wound (see comment) (full thickness wound to scrotum)  Last BM:  6/22   CBG (last 3)   Recent Labs  02/05/16 2330 02/06/16 0323 02/06/16 0750  GLUCAP 124* 139* 149*    Height:   Ht Readings from Last 1 Encounters:  02/01/16 5\' 10"  (1.778 m)    Weight:   Wt Readings from Last 1 Encounters:  02/06/16 212 lb 1.3 oz (96.2 kg)    Ideal Body Weight:  80.9 kg  BMI:  Body mass index is 30.43 kg/(m^2).  Estimated Nutritional Needs:   Kcal:  2000-2200  Protein:  110-130 grams  Fluid:  2- 2.2 L/day  EDUCATION NEEDS:   No education needs identified at this time  Maureen ChattersKatie Terron Merfeld, RD, LDN Pager #:  [phone] After-Hours Pager #: [phone]

## 2016-02-06 NOTE — Progress Notes (Signed)
Patient converted into Afib around 2000 02/05/16. HR-90's. NP notified. Will closeey monitor patient.

## 2016-02-07 DIAGNOSIS — Z515 Encounter for palliative care: Secondary | ICD-10-CM

## 2016-02-07 LAB — CBC WITH DIFFERENTIAL/PLATELET
Basophils Absolute: 0.1 10*3/uL (ref 0.0–0.1)
Basophils Relative: 1 %
Eosinophils Absolute: 0.2 10*3/uL (ref 0.0–0.7)
Eosinophils Relative: 2 %
HCT: 31.5 % — ABNORMAL LOW (ref 39.0–52.0)
Hemoglobin: 9.6 g/dL — ABNORMAL LOW (ref 13.0–17.0)
Lymphocytes Relative: 9 %
Lymphs Abs: 1.1 10*3/uL (ref 0.7–4.0)
MCH: 30.5 pg (ref 26.0–34.0)
MCHC: 30.5 g/dL (ref 30.0–36.0)
MCV: 100 fL (ref 78.0–100.0)
Monocytes Absolute: 0.7 10*3/uL (ref 0.1–1.0)
Monocytes Relative: 6 %
Neutro Abs: 9.6 10*3/uL — ABNORMAL HIGH (ref 1.7–7.7)
Neutrophils Relative %: 82 %
Platelets: 236 10*3/uL (ref 150–400)
RBC: 3.15 MIL/uL — ABNORMAL LOW (ref 4.22–5.81)
RDW: 17.6 % — ABNORMAL HIGH (ref 11.5–15.5)
WBC: 11.7 10*3/uL — ABNORMAL HIGH (ref 4.0–10.5)

## 2016-02-07 LAB — BASIC METABOLIC PANEL WITH GFR
BUN: 41 mg/dL — ABNORMAL HIGH (ref 6–20)
CO2: 23 mmol/L (ref 22–32)
Calcium: 7.7 mg/dL — ABNORMAL LOW (ref 8.9–10.3)
Chloride: >130 mmol/L (ref 101–111)
Creatinine, Ser: 2.2 mg/dL — ABNORMAL HIGH (ref 0.61–1.24)
GFR calc Af Amer: 33 mL/min — ABNORMAL LOW
GFR calc non Af Amer: 28 mL/min — ABNORMAL LOW
Glucose, Bld: 225 mg/dL — ABNORMAL HIGH (ref 65–99)
Potassium: 3.7 mmol/L (ref 3.5–5.1)
Sodium: 158 mmol/L — ABNORMAL HIGH (ref 135–145)

## 2016-02-07 LAB — PHOSPHORUS: PHOSPHORUS: 3.1 mg/dL (ref 2.5–4.6)

## 2016-02-07 LAB — GLUCOSE, CAPILLARY
GLUCOSE-CAPILLARY: 232 mg/dL — AB (ref 65–99)
GLUCOSE-CAPILLARY: 242 mg/dL — AB (ref 65–99)
GLUCOSE-CAPILLARY: 301 mg/dL — AB (ref 65–99)
Glucose-Capillary: 208 mg/dL — ABNORMAL HIGH (ref 65–99)
Glucose-Capillary: 259 mg/dL — ABNORMAL HIGH (ref 65–99)
Glucose-Capillary: 315 mg/dL — ABNORMAL HIGH (ref 65–99)

## 2016-02-07 LAB — MAGNESIUM: Magnesium: 2.6 mg/dL — ABNORMAL HIGH (ref 1.7–2.4)

## 2016-02-07 MED ORDER — FAMOTIDINE 40 MG/5ML PO SUSR
20.0000 mg | Freq: Every day | ORAL | Status: DC
  Administered 2016-02-08 – 2016-02-10 (×3): 20 mg
  Filled 2016-02-07 (×3): qty 2.5

## 2016-02-07 MED ORDER — FAMOTIDINE 40 MG/5ML PO SUSR
20.0000 mg | Freq: Two times a day (BID) | ORAL | Status: DC
  Administered 2016-02-07: 20 mg
  Filled 2016-02-07: qty 2.5

## 2016-02-07 NOTE — Progress Notes (Signed)
[facility] Progress Note  Patient: Jared [name] ZOX:[id number]   PCP: Feliciana Rossetti, MD DOB: [birth year]   DOA: 01/24/2016   DOS: 02/07/2016   Date of Service: the patient was seen and examined on 02/07/2016  Subjective: Patient more interactive this morning. Still disoriented. Denies any acute complaint. Continues to require high flow nasal cannula Nutrition: Remains nothing by mouth until speech evaluation  Brief hospital course: Pt. with PMH of CVA with residual left-sided weakness, dementia, diabetes mellitus; admitted on 01/24/2016, with complaint of ventilator dependent respiratory failure, patient was initially admitted in [facility] on 01/21/2016 after a fall at home with the left humeral neck fracture. Postoperatively the patient was unable to be extubated and had progressive renal failure with volume overload and therefore was transferred to [facility] on 16 2017. Patient received IV antibiotics and with improvement in oxygenation and he was extubated on 01/26/2016. Patient was transferred to hospitalist service and completed antibiotic course on 01/30/2016. 01/31/2016 the patient become febrile with worsening oxygenation with acute hypoxic respiratory failure and was transferred to step down unit on 02/01/2016 with BiPAP support. Currently further plan is continue BiPAP support.  Assessment and Plan: 1. Acute respiratory failure with hypoxia (HCC) Sepsis with healthcare associated pneumonia. Patient completed 7 days of IV antibiotics, initially was able to be extubated. With worsening respiratory status transferred back to step down unit. Continuing BiPAP as needed, continue high flow nasal cannula at present. Initially was on aztreonam and switched to Primaxin. Showing improvement. MRSA negative,  blood cultures negative, urine culture negative. Prognosis is guarded. Continue chest physical therapy  2. Acute kidney injury. Hypernatremia. Appreciate nephrology  consultation. Probably hemodynamically mediated with hypotension as well as enalapril. Worsen with postoperative hypovolemia. Renal function improving, sodium level gradually getting better. Continue D5W as well as free water 200 mL every 4 hours with tube feeding.  3. Diabetes mellitus with hyperglycemia. Patient currently nothing by mouth with concern of aspiration. Continue sliding scale coverage at present.  4. Dysphagia. Protein calorie malnutrition in the context of critical illness. Speech therapy unable to evaluate the patient due to respiratory status. Persistently remains nothing by mouth at present. Palliative care consulted. Family wants to attempt feeding tube. She is currently on tube feeding and tolerating it well. We will continue to monitor patient's progress. If speech therapy reevaluation fails to identify meaningful improvement to allow oral nutrition we will again discuss with goals of care discussion with patient and family.  5. Left femoral neck fracture. Surgically repaired on 01/21/2016. Continue heparin for DVT prophylaxis. PT recommends SNF.  6. History of CVA with right-sided weakness. At on Aggrenox but due to her nothing by mouth status we will use suppository aspirin.  7. Vascular dementia. Holding Namenda due to nothing by mouth status. Continue patch. Also holding Seroquel for depression.  8. Scrotal ulcer. From severe edema. Also worsening from diarrhea. Discuss with family regarding possible options and at present family would like to hold off on urology consultation for surgical debridement. Use air mattress.  9. Goals of care discussion. Prognosis is guarded for the patient. Discussed with patient's family regarding patient's guarded prognosis as well as worsening respiratory status. Also discussed regarding difficulty improving patient's sodium as well as renal function as well as regaining adequate nutrition with future risk for  aspiration. At present family wants to continue current care as they are seen hopeful for further recovery. I discuss that if the patient is unable to take oral nutrition or his oxygenation does  not improve further that the patient can tolerate a regular nasal cannula hope for meaningful recovery is less. We will continue to monitor.  Pain management: When necessary Tylenol Activity: SNF as per physical therapy Bowel regimen: last BM 02/04/2016 Diet: Nothing by mouth DVT Prophylaxis: subcutaneous Heparin  Advance goals of care discussion: DNR/DNI as per earlier discussion  Family Communication: Family was at bedside and all questions were answered satisfactorily.   Disposition:  At present guarded prognosis. We will await improvement in clinical condition before disposition.  Consultants: Critical care, nephrology Procedures: Extubation Echocardiogram  Antibiotics: Anti-infectives    Start     Dose/Rate Route Frequency Ordered Stop   02/05/16 0830  imipenem-cilastatin (PRIMAXIN) 250 mg in sodium chloride 0.9 % 100 mL IVPB     250 mg 200 mL/hr over 30 Minutes Intravenous Every 6 hours 02/05/16 0818     02/04/16 0845  vancomycin (VANCOCIN) 2,000 mg in sodium chloride 0.9 % 500 mL IVPB  Status:  Discontinued     2,000 mg 250 mL/hr over 120 Minutes Intravenous Every 48 hours 02/04/16 0832 02/04/16 1132   02/02/16 1400  aztreonam (AZACTAM) 1 g in dextrose 5 % 50 mL IVPB  Status:  Discontinued     1 g 100 mL/hr over 30 Minutes Intravenous Every 8 hours 02/02/16 1229 02/05/16 0806   02/01/16 2300  vancomycin (VANCOCIN) 1,250 mg in sodium chloride 0.9 % 250 mL IVPB  Status:  Discontinued     1,250 mg 166.7 mL/hr over 90 Minutes Intravenous Every 24 hours 01/31/16 2133 02/02/16 1229   01/31/16 2200  aztreonam (AZACTAM) 2 g in dextrose 5 % 50 mL IVPB  Status:  Discontinued     2 g 100 mL/hr over 30 Minutes Intravenous Every 8 hours 01/31/16 2119 02/02/16 1229   01/31/16 2200  vancomycin  (VANCOCIN) 1,750 mg in sodium chloride 0.9 % 500 mL IVPB     1,750 mg 250 mL/hr over 120 Minutes Intravenous  Once 01/31/16 2133 02/01/16 0113   01/27/16 1800  imipenem-cilastatin (PRIMAXIN) 500 mg in sodium chloride 0.9 % 100 mL IVPB  Status:  Discontinued     500 mg 200 mL/hr over 30 Minutes Intravenous Every 8 hours 01/27/16 1206 01/30/16 1616   01/26/16 1200  imipenem-cilastatin (PRIMAXIN) 250 mg in sodium chloride 0.9 % 100 mL IVPB  Status:  Discontinued     250 mg 200 mL/hr over 30 Minutes Intravenous Every 6 hours 01/26/16 1007 01/27/16 1206   01/25/16 0500  imipenem-cilastatin (PRIMAXIN) 250 mg in sodium chloride 0.9 % 100 mL IVPB  Status:  Discontinued     250 mg 200 mL/hr over 30 Minutes Intravenous Every 12 hours 01/24/16 1817 01/26/16 1007   01/24/16 1700  vancomycin (VANCOCIN) 2,000 mg in sodium chloride 0.9 % 500 mL IVPB     2,000 mg 250 mL/hr over 120 Minutes Intravenous  Once 01/24/16 1628 01/24/16 2038   01/24/16 1700  imipenem-cilastatin (PRIMAXIN) 500 mg in sodium chloride 0.9 % 100 mL IVPB     500 mg 200 mL/hr over 30 Minutes Intravenous  Once 01/24/16 1628 01/24/16 1821        Intake/Output Summary (Last 24 hours) at 02/07/16 1644 Last data filed at 02/07/16 0700  Gross per 24 hour  Intake 1541.34 ml  Output    650 ml  Net 891.34 ml   Filed Weights   02/05/16 0351 02/06/16 0359 02/07/16 0355  Weight: 98.2 kg (216 lb 7.9 oz) 96.2 kg (212 lb 1.3 oz)  97.07 kg (214 lb)    Objective: Physical Exam: Filed Vitals:   02/07/16 0803 02/07/16 0900 02/07/16 1200 02/07/16 1407  BP:  119/56 140/79   Pulse:  78 72   Temp:   98.4 F (36.9 C)   TempSrc:   Axillary   Resp:  24 25   Height:      Weight:      SpO2: 93% 88% 99% 100%    General: Alert, Awake and Disoriented Appear in moderate distress Eyes: PERRL, Conjunctiva normal ENT: Oral Mucosa clear moist. Neck: DA JVD, NO Abnormal Mass Or lumps Cardiovascular: S1 and S2 Present, aortic systolic  Murmur, Respiratory: Bilateral Air entry equal and Decreased, bilateral Crackles, no wheezes Abdomen: Bowel Sound present, Soft and no tenderness Skin:Scrotal ulcer Extremities: no Pedal edema, no calf tenderness Neurologic: Examination is Difficult due to lethargy as well as disorientation  Data Reviewed: CBC:  Recent Labs Lab 02/02/16 0746 02/04/16 0320 02/05/16 0244 02/06/16 0340 02/07/16 0427  WBC 19.5* 12.5* 12.3* 13.4* 11.7*  NEUTROABS  --   --  10.1*  --  9.6*  HGB 9.4* 9.1* 9.5* 9.6* 9.6*  HCT 32.2* 30.2* 31.1* 32.4* 31.5*  MCV 99.7 97.7 94.8 97.0 100.0  PLT 125* 165 187 225 236   Basic Metabolic Panel:  Recent Labs Lab 02/04/16 0320 02/05/16 0244 02/06/16 0345 02/06/16 1905 02/07/16 0423 02/07/16 0427  NA 151* 154* 159* 161*  --  158*  K 3.7 3.6 3.3* 3.7  --  3.7  CL 122* 122* 130* >130*  --  >130*  CO2 21* 22 24 23   --  23  GLUCOSE 329* 188* 142* 161*  --  225*  BUN 50* 47* 41* 42*  --  41*  CREATININE 3.18* 3.00* 2.62* 2.35*  --  2.20*  CALCIUM 7.3* 7.7* 7.9* 7.8*  --  7.7*  MG  --  2.4  --   --   --  2.6*  PHOS  --  3.7 3.3  --  3.1  --     Liver Function Tests:  Recent Labs Lab 02/05/16 0244 02/06/16 0345  ALBUMIN 1.6* 1.6*   No results for input(s): LIPASE, AMYLASE in the last 168 hours. No results for input(s): AMMONIA in the last 168 hours. Coagulation Profile: No results for input(s): INR, PROTIME in the last 168 hours. Cardiac Enzymes: No results for input(s): CKTOTAL, CKMB, CKMBINDEX, TROPONINI in the last 168 hours. BNP (last 3 results) No results for input(s): PROBNP in the last 8760 hours.  CBG:  Recent Labs Lab 02/06/16 2030 02/07/16 0005 02/07/16 0351 02/07/16 0739 02/07/16 1152  GLUCAP 155* 208* 232* 242* 301*    Studies: No results found.   Scheduled Meds: . antiseptic oral rinse  7 mL Mouth Rinse BID  . aspirin  300 mg Rectal Daily  . [START ON 02/08/2016] famotidine  20 mg Per Tube Daily  . free water  200 mL  Per Tube Q4H  . heparin  5,000 Units Subcutaneous Q8H  . imipenem-cilastatin  250 mg Intravenous Q6H  . insulin aspart  0-15 Units Subcutaneous Q4H  . insulin glargine  15 Units Subcutaneous Daily  . ipratropium-albuterol  3 mL Nebulization Q6H  . nitroGLYCERIN  0.2 mg Transdermal Daily  . potassium chloride  40 mEq Oral Daily  . Rivastigmine  13.3 mg Transdermal Daily  . sodium chloride  3 mL Nebulization Q8H   Continuous Infusions: . dextrose 100 mL/hr at 02/07/16 0854  . feeding supplement (GLUCERNA 1.2 CAL) 1,000  mL (02/07/16 0611)   PRN Meds: acetaminophen (TYLENOL) oral liquid 160 mg/5 mL, acetaminophen, ipratropium-albuterol, RESOURCE THICKENUP CLEAR  Time spent: 30 minutes  Author: Lynden OxfordPranav Zaheer Wageman, MD [facility] Pager: [phone] 02/07/2016 4:44 PM  If 7PM-7AM, please contact night-coverage at [email or web address], password [email or web address]

## 2016-02-07 NOTE — Progress Notes (Signed)
Assessment:  1 AKI- sl improved, prob hemodynamically mediated with hx of a fall and enalapril on board, exacerbated by hypovolemia-worse 2 Hypernatremia, minimal improvement("rock and hard place") 3 Prob asp PNA Rec: Increase D5W (plus TF with free water)  Subjective: Interval History: More alert, being tube fed  Objective: Vital signs in last 24 hours: Temp:  [97 F (36.1 C)-98.5 F (36.9 C)] 98.4 F (36.9 C) (06/24 1200) Pulse Rate:  [72-86] 72 (06/24 1200) Resp:  [23-32] 25 (06/24 1200) BP: (103-140)/(54-79) 140/79 mmHg (06/24 1200) SpO2:  [88 %-100 %] 100 % (06/24 1407) FiO2 (%):  [40 %] 40 % (06/24 1407) Weight:  [97.07 kg (214 lb)] 97.07 kg (214 lb) (06/24 0355) Weight change: 0.87 kg (1 lb 14.7 oz)  Intake/Output from previous day: 06/23 0701 - 06/24 0700 In: 1841.3 [I.V.:225; WU/JW:1191.4G/GT:1116.3; IV Piggyback:500] Out: 1000 [Urine:1000] Intake/Output this shift:    General appearance: more alert today Resp: diminished breath sounds bilaterally Cardio: regular rate and rhythm, S1, S2 normal, no murmur, click, rub or gallop Extremities: extremities normal, atraumatic, no cyanosis or edema  Lab Results:  Recent Labs  02/06/16 0340 02/07/16 0427  WBC 13.4* 11.7*  HGB 9.6* 9.6*  HCT 32.4* 31.5*  PLT 225 236   BMET:  Recent Labs  02/06/16 1905 02/07/16 0427  NA 161* 158*  K 3.7 3.7  CL >130* >130*  CO2 23 23  GLUCOSE 161* 225*  BUN 42* 41*  CREATININE 2.35* 2.20*  CALCIUM 7.8* 7.7*   No results for input(s): PTH in the last 72 hours. Iron Studies: No results for input(s): IRON, TIBC, TRANSFERRIN, FERRITIN in the last 72 hours. Studies/Results: Dg Abd Portable 1v  02/06/2016  CLINICAL DATA:  Feeding tube placement EXAM: PORTABLE ABDOMEN - 1 VIEW COMPARISON:  Portable exam 1605 hours compared to 1339 hours FINDINGS: Tip of feeding tube projects over approximately the junction of the second and third portions of the duodenum. Bowel gas pattern normal. Bones  demineralized. Surgical clips RIGHT upper quadrant question cholecystectomy. IMPRESSION: Tip of feeding tube projects over approximately the junction of the second and third portions of the duodenum. Electronically Signed   By: Ulyses SouthwardMark  Boles M.D.   On: 02/06/2016 16:12   Dg Abd Portable 1v  02/06/2016  CLINICAL DATA:  Nasogastric tube placement. EXAM: PORTABLE ABDOMEN - 1 VIEW COMPARISON:  Chest radiograph of 1 day prior FINDINGS: Feeding tube terminates at the gastric antrum. Cholecystectomy. Bibasilar airspace disease with right hemidiaphragm elevation. IMPRESSION: Feeding tube terminating at the gastric antrum. Electronically Signed   By: Jeronimo GreavesKyle  Talbot M.D.   On: 02/06/2016 13:48    Scheduled: . antiseptic oral rinse  7 mL Mouth Rinse BID  . aspirin  300 mg Rectal Daily  . famotidine  20 mg Per Tube BID  . free water  200 mL Per Tube Q4H  . heparin  5,000 Units Subcutaneous Q8H  . imipenem-cilastatin  250 mg Intravenous Q6H  . insulin aspart  0-15 Units Subcutaneous Q4H  . insulin glargine  15 Units Subcutaneous Daily  . ipratropium-albuterol  3 mL Nebulization Q6H  . nitroGLYCERIN  0.2 mg Transdermal Daily  . potassium chloride  40 mEq Oral Daily  . Rivastigmine  13.3 mg Transdermal Daily  . sodium chloride  3 mL Nebulization Q8H    LOS: 14 days   Rockney Grenz C 02/07/2016,2:21 PM

## 2016-02-08 ENCOUNTER — Inpatient Hospital Stay (HOSPITAL_COMMUNITY): Payer: Medicare Other

## 2016-02-08 LAB — RENAL FUNCTION PANEL
ALBUMIN: 1.5 g/dL — AB (ref 3.5–5.0)
Anion gap: 3 — ABNORMAL LOW (ref 5–15)
BUN: 38 mg/dL — AB (ref 6–20)
CALCIUM: 7.5 mg/dL — AB (ref 8.9–10.3)
CHLORIDE: 130 mmol/L — AB (ref 101–111)
CO2: 23 mmol/L (ref 22–32)
CREATININE: 1.9 mg/dL — AB (ref 0.61–1.24)
GFR, EST AFRICAN AMERICAN: 39 mL/min — AB (ref >60)
GFR, EST NON AFRICAN AMERICAN: 34 mL/min — AB (ref >60)
Glucose, Bld: 208 mg/dL — ABNORMAL HIGH (ref 65–99)
PHOSPHORUS: 3.6 mg/dL (ref 2.5–4.6)
Potassium: 3.6 mmol/L (ref 3.5–5.1)
Sodium: 156 mmol/L — ABNORMAL HIGH (ref 135–145)

## 2016-02-08 LAB — GLUCOSE, CAPILLARY
GLUCOSE-CAPILLARY: 210 mg/dL — AB (ref 65–99)
GLUCOSE-CAPILLARY: 227 mg/dL — AB (ref 65–99)
GLUCOSE-CAPILLARY: 290 mg/dL — AB (ref 65–99)
Glucose-Capillary: 205 mg/dL — ABNORMAL HIGH (ref 65–99)
Glucose-Capillary: 210 mg/dL — ABNORMAL HIGH (ref 65–99)
Glucose-Capillary: 321 mg/dL — ABNORMAL HIGH (ref 65–99)
Glucose-Capillary: 252 mg/dL — ABNORMAL HIGH (ref 65–99)

## 2016-02-08 LAB — MAGNESIUM: MAGNESIUM: 2.7 mg/dL — AB (ref 1.7–2.4)

## 2016-02-08 MED ORDER — SODIUM CHLORIDE 0.9 % IV SOLN
500.0000 mg | Freq: Three times a day (TID) | INTRAVENOUS | Status: AC
  Administered 2016-02-08 – 2016-02-11 (×9): 500 mg via INTRAVENOUS
  Filled 2016-02-08 (×9): qty 500

## 2016-02-08 MED ORDER — ASPIRIN-DIPYRIDAMOLE ER 25-200 MG PO CP12
1.0000 | ORAL_CAPSULE | Freq: Every day | ORAL | Status: DC
  Administered 2016-02-08 – 2016-02-10 (×3): 1
  Filled 2016-02-08 (×4): qty 1

## 2016-02-08 MED ORDER — FLORANEX PO PACK
1.0000 g | PACK | Freq: Three times a day (TID) | ORAL | Status: DC
  Administered 2016-02-08 – 2016-02-10 (×6): 1 g
  Filled 2016-02-08 (×10): qty 1

## 2016-02-08 MED ORDER — INSULIN GLARGINE 100 UNIT/ML ~~LOC~~ SOLN
20.0000 [IU] | Freq: Every day | SUBCUTANEOUS | Status: DC
  Administered 2016-02-09 – 2016-02-10 (×2): 20 [IU] via SUBCUTANEOUS
  Filled 2016-02-08 (×2): qty 0.2

## 2016-02-08 NOTE — Progress Notes (Signed)
Pharmacy Antibiotic Note  Jared SalvageRonald [name] is a 75 y.o. male admitted on 01/24/2016 with acute respiratory failure and AKI. Pharmacy consulted to broaden back to imipenem for HCAP. Renal function has improved, now afebrile, WBC trended down. At risk for aspiration with dysphagia.  Plan: Increase imipenem to 500 mg IV q8h - consider stopping after 8 days (through 6/29) Pharmacy signing off, will adjust peripherally as renal function improves   Height: 5\' 10"  (177.8 cm) Weight: 216 lb (97.977 kg) IBW/kg (Calculated) : 73  Temp (24hrs), Avg:97.8 F (36.6 C), Min:96 F (35.6 C), Max:98.6 F (37 C)   Recent Labs Lab 02/02/16 0746 02/02/16 2230  02/04/16 0320 02/04/16 0330 02/05/16 0244 02/06/16 0340 02/06/16 0345 02/06/16 1905 02/07/16 0427 02/08/16 0212  WBC 19.5*  --   --  12.5*  --  12.3* 13.4*  --   --  11.7*  --   CREATININE 3.22*  --   < > 3.18*  --  3.00*  --  2.62* 2.35* 2.20* 1.90*  VANCORANDOM  --  38  --   --  21  --   --   --   --   --   --   < > = values in this interval not displayed.  Estimated Creatinine Clearance: 41.3 mL/min (by C-G formula based on Cr of 1.9).    Allergies  Allergen Reactions  . Penicillins Hives    Early childhood reaction - no other information available    Antimicrobials this admission: Vanc x 1 6/10 Primaxin 6/10>> 6/17; 6/22 Vanc 6/17 >>6/21 Aztreonam 6/17 >>6/22  Dose adjustments this admission: 6/19 PM VR (prior to 3rd dose): 38 (continue to hold) 6/20 VR: 21  Microbiology results: 6/10 BCx: ngf 6/11 UC: ngf 6/11 mrsa pcr: neg 6/17 BCx: ngtd   Loura BackJennifer Chicot, Pharm.D., BCPS Clinical Pharmacist Pager: [phone] 02/08/2016 9:47 AM

## 2016-02-08 NOTE — Progress Notes (Signed)
Assessment:  1 AKI-  improved, prob hemodynamically mediated with hx of a fall and enalapril on board, exacerbated by hypovolemia-worse 2 Hypernatremia, minimal improvement("rock and hard place") 3 Prob asp PNA Rec: Continue D5W (plus TF with free water)  Subjective: Interval History: Feels better  Objective: Vital signs in last 24 hours: Temp:  [96 F (35.6 C)-98.6 F (37 C)] 97.8 F (36.6 C) (06/25 1100) Pulse Rate:  [64-88] 74 (06/25 1141) Resp:  [16-33] 19 (06/25 1141) BP: (103-146)/(42-97) 108/57 mmHg (06/25 1100) SpO2:  [71 %-100 %] 95 % (06/25 1141) FiO2 (%):  [30 %-50 %] 50 % (06/25 1141) Weight:  [97.977 kg (216 lb)] 97.977 kg (216 lb) (06/25 0355) Weight change: 0.907 kg (2 lb)  Intake/Output from previous day: 06/24 0701 - 06/25 0700 In: 4274.3 [I.V.:2305; NW/GN:5621.3G/GT:1569.3; IV Piggyback:400] Out: 1075 [Urine:1075] Intake/Output this shift: Total I/O In: 175 [I.V.:100; NG/GT:75] Out: -   General appearance: alert and cooperative Resp: faily clear today Cardio: regular rate and rhythm, S1, S2 normal, no murmur, click, rub or gallop Extremities: extremities normal, atraumatic, no cyanosis or edema  Lab Results:  Recent Labs  02/06/16 0340 02/07/16 0427  WBC 13.4* 11.7*  HGB 9.6* 9.6*  HCT 32.4* 31.5*  PLT 225 236   BMET:  Recent Labs  02/07/16 0427 02/08/16 0212  NA 158* 156*  K 3.7 3.6  CL >130* 130*  CO2 23 23  GLUCOSE 225* 208*  BUN 41* 38*  CREATININE 2.20* 1.90*  CALCIUM 7.7* 7.5*   No results for input(s): PTH in the last 72 hours. Iron Studies: No results for input(s): IRON, TIBC, TRANSFERRIN, FERRITIN in the last 72 hours. Studies/Results: Dg Chest Port 1 View  02/08/2016  CLINICAL DATA:  Follow-up pneumonia, stroke EXAM: PORTABLE CHEST 1 VIEW COMPARISON:  02/05/2016 FINDINGS: Cardiomegaly again noted. There is NG feeding tube with tip in proximal duodenum. No pulmonary edema. Persistent bilateral basilar streaky atelectasis or  infiltrate. IMPRESSION: Persistent bilateral basilar streaky atelectasis or infiltrate. NG feeding tube in place. Electronically Signed   By: Natasha MeadLiviu  Pop M.D.   On: 02/08/2016 09:11   Dg Abd Portable 1v  02/06/2016  CLINICAL DATA:  Feeding tube placement EXAM: PORTABLE ABDOMEN - 1 VIEW COMPARISON:  Portable exam 1605 hours compared to 1339 hours FINDINGS: Tip of feeding tube projects over approximately the junction of the second and third portions of the duodenum. Bowel gas pattern normal. Bones demineralized. Surgical clips RIGHT upper quadrant question cholecystectomy. IMPRESSION: Tip of feeding tube projects over approximately the junction of the second and third portions of the duodenum. Electronically Signed   By: Ulyses SouthwardMark  Boles M.D.   On: 02/06/2016 16:12   Dg Abd Portable 1v  02/06/2016  CLINICAL DATA:  Nasogastric tube placement. EXAM: PORTABLE ABDOMEN - 1 VIEW COMPARISON:  Chest radiograph of 1 day prior FINDINGS: Feeding tube terminates at the gastric antrum. Cholecystectomy. Bibasilar airspace disease with right hemidiaphragm elevation. IMPRESSION: Feeding tube terminating at the gastric antrum. Electronically Signed   By: Jeronimo GreavesKyle  Talbot M.D.   On: 02/06/2016 13:48    Scheduled: . antiseptic oral rinse  7 mL Mouth Rinse BID  . dipyridamole-aspirin  1 capsule Per Tube Daily  . famotidine  20 mg Per Tube Daily  . free water  200 mL Per Tube Q4H  . heparin  5,000 Units Subcutaneous Q8H  . imipenem-cilastatin  500 mg Intravenous Q8H  . insulin aspart  0-15 Units Subcutaneous Q4H  . insulin glargine  15 Units Subcutaneous Daily  .  ipratropium-albuterol  3 mL Nebulization Q6H  . lactobacillus  1 g Per Tube TID WC  . nitroGLYCERIN  0.2 mg Transdermal Daily  . potassium chloride  40 mEq Oral Daily  . Rivastigmine  13.3 mg Transdermal Daily  . sodium chloride  3 mL Nebulization Q8H    LOS: 15 days   Jared [name] C 02/08/2016,12:00 PM

## 2016-02-08 NOTE — Progress Notes (Signed)
Daily Progress Note   Patient Name: Jared [name]       Date: 02/08/2016 DOB: 05/10/1941  Age: 75 y.o. MRN#: [id number] Attending Physician: Rolly SalterPranav M Patel, MD Primary Care Physician: [name],GREG, MD Admit Date: 01/24/2016  Reason for Consultation/Follow-up: Establishing goals of care and Psychosocial/spiritual support  Subjective: Pt's sodium,. creatinine, trending in a better direction. He is more alert but remains confused. Still on hi flo. Updated wife and son at the bedside. They remain in a hopeful waiting place to see if he can wean off hi flo. No other GOC issues addressed  Length of Stay: 15  Current Medications: Scheduled Meds:  . antiseptic oral rinse  7 mL Mouth Rinse BID  . dipyridamole-aspirin  1 capsule Per Tube Daily  . famotidine  20 mg Per Tube Daily  . free water  200 mL Per Tube Q4H  . heparin  5,000 Units Subcutaneous Q8H  . imipenem-cilastatin  500 mg Intravenous Q8H  . insulin aspart  0-15 Units Subcutaneous Q4H  . insulin glargine  15 Units Subcutaneous Daily  . ipratropium-albuterol  3 mL Nebulization Q6H  . lactobacillus  1 g Per Tube TID WC  . nitroGLYCERIN  0.2 mg Transdermal Daily  . potassium chloride  40 mEq Oral Daily  . Rivastigmine  13.3 mg Transdermal Daily  . sodium chloride  3 mL Nebulization Q8H    Continuous Infusions: . dextrose 100 mL/hr at 02/08/16 0448  . feeding supplement (GLUCERNA 1.2 CAL) 1,000 mL (02/08/16 1104)    PRN Meds: acetaminophen (TYLENOL) oral liquid 160 mg/5 mL, acetaminophen, ipratropium-albuterol, RESOURCE THICKENUP CLEAR  Physical Exam  Constitutional: He appears well-developed and well-nourished.  HENT:  Head: Normocephalic and atraumatic.  Cardiovascular: Normal rate and regular rhythm.   Pulmonary/Chest:  On hi  flo 02 40%, 30L  Abdominal:  NG in for feeding  Genitourinary:  Foley and rectal tube  Neurological: He is alert.  More alert but remains confused. Makes better eye contact and speech is easier to understand  Skin: Skin is warm.  Nursing note and vitals reviewed.           Vital Signs: BP 128/54 mmHg  Pulse 64  Temp(Src) 96 F (35.6 C) (Axillary)  Resp 18  Ht 5\' 10"  (1.778 m)  Wt 97.977 kg (216 lb)  BMI 30.99 kg/m2  SpO2 96% SpO2: SpO2: 96 % O2 Device: O2 Device: High Flow Nasal Cannula O2 Flow Rate: O2 Flow Rate (L/min): 20 L/min  Intake/output summary:  Intake/Output Summary (Last 24 hours) at 02/08/16 1113 Last data filed at 02/08/16 0800  Gross per 24 hour  Intake 4449.33 ml  Output   1075 ml  Net 3374.33 ml   LBM: Last BM Date: 02/07/16 Baseline Weight: Weight: 101.2 kg (223 lb 1.7 oz) Most recent weight: Weight: 97.977 kg (216 lb)       Palliative Assessment/Data:      Patient Active Problem List   Diagnosis Date Noted  . Acute respiratory failure with hypoxemia (HCC)   . DNR (do not resuscitate)   . Palliative care encounter   . Dysphagia   . Uncontrolled diabetes mellitus with hyperglycemia, with long-term current use of insulin (HCC) 01/28/2016  . Hypernatremia 01/28/2016  . Acute kidney injury (HCC) 01/28/2016  . Acute respiratory failure with hypoxia (HCC) 01/28/2016  . HCAP (healthcare-associated pneumonia) 01/28/2016  . 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 Acute respiratory failure (HCC) 01/24/2016  . Diabetes mellitus 03/25/2011  . Hearing loss 03/25/2011  . GERD (gastroesophageal reflux disease) 03/25/2011  . High blood pressure 03/25/2011  . Anxiety 03/25/2011    Palliative Care Assessment & Plan   Patient Profile: Patient is a 75 year old man with a history of hypertension, GERD, anxiety, hearing loss, diabetes, stroke, MI, dementia who was [facility] after a fall at home where he sustained a left femoral  neck fracture. This has been surgically repaired. Postoperatively patient was unable to wean from the vent. He has had progressive renal failure as well as volume overload and was transferred to [facility] on 01/24/2016 for ongoing care and renal evaluation. He was placed on high flow oxygen which he continues to be on a 40%/30 L  Assessment: Pt is a little more alert. No overt distress. Family at the bedside and feels as though he is improving  Recommendations/Plan:  Wean hi flo if indicated  Continue with all supportive therapy  Family's goals are for pt to get better and rehab   Goals of Care and Additional Recommendations:  Limitations on Scope of Treatment: Full Scope Treatment  Code Status:    Code Status Orders        Start     Ordered   01/24/16 1919  Do not attempt resuscitation (DNR)   Continuous     01/24/16 1918    Code Status History    Date Active Date Inactive Code Status Order ID Comments User Context   01/24/2016  4:15 PM 01/24/2016  7:18 PM Full Code [id number]  [name], NP Inpatient       Prognosis:   Unable to determine  Discharge Planning:  To Be Determined   Thank you for allowing the Palliative Medicine Team to assist in the care of this patient.   Time In: 0915 Time Out: 0930 Total Time 15 min Prolonged Time Billed  no       Greater than 50%  of this time was spent counseling and coordinating care related to the above assessment and plan.  [name], NP  Please contact Palliative Medicine Team phone at [phone] for questions and concerns.

## 2016-02-08 NOTE — Progress Notes (Signed)
[facility] Progress Note  Patient: Jared [name] WUJ:[id number]   PCP: Feliciana Rossetti, MD DOB: 1940/10/07   DOA: 01/24/2016   DOS: 02/08/2016   Date of Service: the patient was seen and examined on 02/08/2016  Subjective: The patient is awake but confused and not following command as compared to yesterday. No acute events identified overnight. Continues to deny about any acute complaint Nutrition: Remains nothing by mouth until speech evaluation  Brief hospital course: Pt. with PMH of CVA with residual left-sided weakness, dementia, diabetes mellitus; admitted on 01/24/2016, with complaint of ventilator dependent respiratory failure, patient was initially admitted in [facility] on 01/21/2016 after a fall at home with the left humeral neck fracture. Postoperatively the patient was unable to be extubated and had progressive renal failure with volume overload and therefore was transferred to [facility] on 16 2017. Patient received IV antibiotics and with improvement in oxygenation and he was extubated on 01/26/2016. Patient was transferred to hospitalist service and completed antibiotic course on 01/30/2016. 01/31/2016 the patient become febrile with worsening oxygenation with acute hypoxic respiratory failure and was transferred to step down unit on 02/01/2016 with BiPAP support. Currently further plan is continue respiratory support.  Assessment and Plan: 1. Acute respiratory failure with hypoxia (HCC) Sepsis with healthcare associated pneumonia. Patient completed 7 days of IV antibiotics, initially was able to be extubated. With worsening respiratory status transferred back to step down unit. Continuing BiPAP as needed, continue high flow nasal cannula at present. Initially was on aztreonam and switched to Primaxin. Last day of antibiotic 02/10/2016. At present stable MRSA negative,  blood cultures negative, urine culture negative. Prognosis is guarded. Continue chest physical  therapy  2. Acute kidney injury. Hypernatremia. Appreciate nephrology consultation. Probably hemodynamically mediated with hypotension as well as enalapril. Worsen with postoperative hypovolemia. Renal function improving, sodium level gradually getting better. Continue D5W as well as free water 200 mL every 4 hours with tube feeding.  3. Diabetes mellitus with hyperglycemia. Patient currently nothing by mouth with concern of aspiration. Continue sliding scale coverage at present. Increase Lantus to 20 units  4. Dysphagia. Protein calorie malnutrition in the context of critical illness. Speech therapy unable to evaluate the patient due to respiratory status. Persistently remains nothing by mouth at present. Palliative care consulted. Family wants to attempt feeding tube. She is currently on tube feeding and tolerating it well. We will continue to monitor patient's progress. If speech therapy reevaluation fails to identify meaningful improvement to allow oral nutrition we will again discuss with goals of care discussion with patient and family. Patient is having diarrhea while initiation of the tube feeding, nutrition reconsulted to change tube feeding to low-residue.  5. Left femoral neck fracture. Surgically repaired on 01/21/2016. Continue heparin for DVT prophylaxis. PT recommends SNF.  6. History of CVA with right-sided weakness. Was given aspirin suppository while remaining nothing by mouth. At present we will transition back to Aggrenox per tube  7. Vascular dementia. Holding Namenda due to nothing by mouth status. Continue patch. Also holding Seroquel for depression.  8. Scrotal ulcer. From severe edema. Also worsening from diarrhea. Discuss with family regarding possible options and at present family would like to hold off on urology consultation for surgical debridement. Use air mattress.  9. Goals of care discussion. Prognosis is guarded for the patient. Discussed  with patient's family regarding patient's guarded prognosis as well as worsening respiratory status. Also discussed regarding difficulty improving patient's sodium as well as renal function as well as  regaining adequate nutrition with future risk for aspiration. At present family wants to continue current care as they are seen hopeful for further recovery. I discuss that if the patient is unable to take oral nutrition or his oxygenation does not improve further that the patient can tolerate a regular nasal cannula hope for meaningful recovery is less. We will continue to monitor.  Pain management: When necessary Tylenol Activity: SNF as per physical therapy Bowel regimen: last BM 02/08/2016 Diet: On tube feeding with Glucerna DVT Prophylaxis: subcutaneous Heparin  Advance goals of care discussion: DNR/DNI as per earlier discussion  Family Communication: no Family was at bedside  Disposition:  Continues to have guarded prognosis with high oxygen requirement.  Consultants: Critical care, nephrology Procedures: Extubation Echocardiogram  Antibiotics: Anti-infectives    Start     Dose/Rate Route Frequency Ordered Stop   02/08/16 1100  imipenem-cilastatin (PRIMAXIN) 500 mg in sodium chloride 0.9 % 100 mL IVPB     500 mg 200 mL/hr over 30 Minutes Intravenous Every 8 hours 02/08/16 0943     02/05/16 0830  imipenem-cilastatin (PRIMAXIN) 250 mg in sodium chloride 0.9 % 100 mL IVPB  Status:  Discontinued     250 mg 200 mL/hr over 30 Minutes Intravenous Every 6 hours 02/05/16 0818 02/08/16 0943   02/04/16 0845  vancomycin (VANCOCIN) 2,000 mg in sodium chloride 0.9 % 500 mL IVPB  Status:  Discontinued     2,000 mg 250 mL/hr over 120 Minutes Intravenous Every 48 hours 02/04/16 0832 02/04/16 1132   02/02/16 1400  aztreonam (AZACTAM) 1 g in dextrose 5 % 50 mL IVPB  Status:  Discontinued     1 g 100 mL/hr over 30 Minutes Intravenous Every 8 hours 02/02/16 1229 02/05/16 0806   02/01/16 2300   vancomycin (VANCOCIN) 1,250 mg in sodium chloride 0.9 % 250 mL IVPB  Status:  Discontinued     1,250 mg 166.7 mL/hr over 90 Minutes Intravenous Every 24 hours 01/31/16 2133 02/02/16 1229   01/31/16 2200  aztreonam (AZACTAM) 2 g in dextrose 5 % 50 mL IVPB  Status:  Discontinued     2 g 100 mL/hr over 30 Minutes Intravenous Every 8 hours 01/31/16 2119 02/02/16 1229   01/31/16 2200  vancomycin (VANCOCIN) 1,750 mg in sodium chloride 0.9 % 500 mL IVPB     1,750 mg 250 mL/hr over 120 Minutes Intravenous  Once 01/31/16 2133 02/01/16 0113   01/27/16 1800  imipenem-cilastatin (PRIMAXIN) 500 mg in sodium chloride 0.9 % 100 mL IVPB  Status:  Discontinued     500 mg 200 mL/hr over 30 Minutes Intravenous Every 8 hours 01/27/16 1206 01/30/16 1616   01/26/16 1200  imipenem-cilastatin (PRIMAXIN) 250 mg in sodium chloride 0.9 % 100 mL IVPB  Status:  Discontinued     250 mg 200 mL/hr over 30 Minutes Intravenous Every 6 hours 01/26/16 1007 01/27/16 1206   01/25/16 0500  imipenem-cilastatin (PRIMAXIN) 250 mg in sodium chloride 0.9 % 100 mL IVPB  Status:  Discontinued     250 mg 200 mL/hr over 30 Minutes Intravenous Every 12 hours 01/24/16 1817 01/26/16 1007   01/24/16 1700  vancomycin (VANCOCIN) 2,000 mg in sodium chloride 0.9 % 500 mL IVPB     2,000 mg 250 mL/hr over 120 Minutes Intravenous  Once 01/24/16 1628 01/24/16 2038   01/24/16 1700  imipenem-cilastatin (PRIMAXIN) 500 mg in sodium chloride 0.9 % 100 mL IVPB     500 mg 200 mL/hr over 30 Minutes Intravenous  Once 01/24/16 1628 01/24/16 1821        Intake/Output Summary (Last 24 hours) at 02/08/16 1625 Last data filed at 02/08/16 0800  Gross per 24 hour  Intake 3544.33 ml  Output   1075 ml  Net 2469.33 ml   Filed Weights   02/06/16 0359 02/07/16 0355 02/08/16 0355  Weight: 96.2 kg (212 lb 1.3 oz) 97.07 kg (214 lb) 97.977 kg (216 lb)    Objective: Physical Exam: Filed Vitals:   02/08/16 1441 02/08/16 1445 02/08/16 1500 02/08/16 1505  BP:    139/65   Pulse:   88   Temp:   97.8 F (36.6 C)   TempSrc:   Oral   Resp:   32 22  Height:      Weight:      SpO2: 100% 95% 98% 98%    General: Alert, Awake and Disoriented Appear in moderate distress, Moaning but denies pain Eyes: PERRL, Conjunctiva normal ENT: Oral Mucosa clear moist. Neck: DA JVD, NO Abnormal Mass Or lumps Cardiovascular: S1 and S2 Present, aortic systolic Murmur, Respiratory: Bilateral Air entry equal and Decreased, bilateral Crackles, no wheezes Abdomen: Bowel Sound present, Soft and no tenderness Skin:Scrotal ulcer Extremities: no Pedal edema, no calf tenderness Neurologic: Examination is Difficult due to lethargy as well as disorientation  Data Reviewed: CBC:  Recent Labs Lab 02/02/16 0746 02/04/16 0320 02/05/16 0244 02/06/16 0340 02/07/16 0427  WBC 19.5* 12.5* 12.3* 13.4* 11.7*  NEUTROABS  --   --  10.1*  --  9.6*  HGB 9.4* 9.1* 9.5* 9.6* 9.6*  HCT 32.2* 30.2* 31.1* 32.4* 31.5*  MCV 99.7 97.7 94.8 97.0 100.0  PLT 125* 165 187 225 236   Basic Metabolic Panel:  Recent Labs Lab 02/05/16 0244 02/06/16 0345 02/06/16 1905 02/07/16 0423 02/07/16 0427 02/08/16 0212  NA 154* 159* 161*  --  158* 156*  K 3.6 3.3* 3.7  --  3.7 3.6  CL 122* 130* >130*  --  >130* 130*  CO2 22 24 23   --  23 23  GLUCOSE 188* 142* 161*  --  225* 208*  BUN 47* 41* 42*  --  41* 38*  CREATININE 3.00* 2.62* 2.35*  --  2.20* 1.90*  CALCIUM 7.7* 7.9* 7.8*  --  7.7* 7.5*  MG 2.4  --   --   --  2.6* 2.7*  PHOS 3.7 3.3  --  3.1  --  3.6    Liver Function Tests:  Recent Labs Lab 02/05/16 0244 02/06/16 0345 02/08/16 0212  ALBUMIN 1.6* 1.6* 1.5*   No results for input(s): LIPASE, AMYLASE in the last 168 hours. No results for input(s): AMMONIA in the last 168 hours. Coagulation Profile: No results for input(s): INR, PROTIME in the last 168 hours. Cardiac Enzymes: No results for input(s): CKTOTAL, CKMB, CKMBINDEX, TROPONINI in the last 168 hours. BNP (last 3  results) No results for input(s): PROBNP in the last 8760 hours.  CBG:  Recent Labs Lab 02/07/16 2054 02/08/16 0001 02/08/16 0352 02/08/16 0730 02/08/16 1141  GLUCAP 315* 227* 205* 210* 290*    Studies: Dg Chest Port 1 View  02/08/2016  CLINICAL DATA:  Follow-up pneumonia, stroke EXAM: PORTABLE CHEST 1 VIEW COMPARISON:  02/05/2016 FINDINGS: Cardiomegaly again noted. There is NG feeding tube with tip in proximal duodenum. No pulmonary edema. Persistent bilateral basilar streaky atelectasis or infiltrate. IMPRESSION: Persistent bilateral basilar streaky atelectasis or infiltrate. NG feeding tube in place. Electronically Signed   By: Lanette Hampshire.D.  On: 02/08/2016 09:11     Scheduled Meds: . antiseptic oral rinse  7 mL Mouth Rinse BID  . dipyridamole-aspirin  1 capsule Per Tube Daily  . famotidine  20 mg Per Tube Daily  . free water  200 mL Per Tube Q4H  . heparin  5,000 Units Subcutaneous Q8H  . imipenem-cilastatin  500 mg Intravenous Q8H  . insulin aspart  0-15 Units Subcutaneous Q4H  . insulin glargine  15 Units Subcutaneous Daily  . ipratropium-albuterol  3 mL Nebulization Q6H  . lactobacillus  1 g Per Tube TID WC  . nitroGLYCERIN  0.2 mg Transdermal Daily  . potassium chloride  40 mEq Oral Daily  . Rivastigmine  13.3 mg Transdermal Daily  . sodium chloride  3 mL Nebulization Q8H   Continuous Infusions: . dextrose 100 mL/hr at 02/08/16 0448  . feeding supplement (GLUCERNA 1.2 CAL) 1,000 mL (02/08/16 1104)   PRN Meds: acetaminophen (TYLENOL) oral liquid 160 mg/5 mL, acetaminophen, ipratropium-albuterol, RESOURCE THICKENUP CLEAR  Time spent: 30 minutes  Author: Lynden OxfordPranav Makenzy Krist, MD [facility] Pager: [phone] 02/08/2016 4:25 PM  If 7PM-7AM, please contact night-coverage at [email or web address], password [email or web address]

## 2016-02-09 LAB — RENAL FUNCTION PANEL
Albumin: 1.5 g/dL — ABNORMAL LOW (ref 3.5–5.0)
Anion gap: 4 — ABNORMAL LOW (ref 5–15)
BUN: 34 mg/dL — AB (ref 6–20)
CHLORIDE: 124 mmol/L — AB (ref 101–111)
CO2: 23 mmol/L (ref 22–32)
Calcium: 7.3 mg/dL — ABNORMAL LOW (ref 8.9–10.3)
Creatinine, Ser: 1.59 mg/dL — ABNORMAL HIGH (ref 0.61–1.24)
GFR calc Af Amer: 48 mL/min — ABNORMAL LOW (ref >60)
GFR calc non Af Amer: 42 mL/min — ABNORMAL LOW (ref >60)
GLUCOSE: 238 mg/dL — AB (ref 65–99)
POTASSIUM: 4 mmol/L (ref 3.5–5.1)
Phosphorus: 3.3 mg/dL (ref 2.5–4.6)
Sodium: 151 mmol/L — ABNORMAL HIGH (ref 135–145)

## 2016-02-09 LAB — CBC
HEMATOCRIT: 29.6 % — AB (ref 39.0–52.0)
HEMOGLOBIN: 8.6 g/dL — AB (ref 13.0–17.0)
MCH: 28.5 pg (ref 26.0–34.0)
MCHC: 29.1 g/dL — AB (ref 30.0–36.0)
MCV: 98 fL (ref 78.0–100.0)
Platelets: 243 10*3/uL (ref 150–400)
RBC: 3.02 MIL/uL — ABNORMAL LOW (ref 4.22–5.81)
RDW: 17.7 % — AB (ref 11.5–15.5)
WBC: 9.3 10*3/uL (ref 4.0–10.5)

## 2016-02-09 LAB — GLUCOSE, CAPILLARY
GLUCOSE-CAPILLARY: 189 mg/dL — AB (ref 65–99)
GLUCOSE-CAPILLARY: 193 mg/dL — AB (ref 65–99)
Glucose-Capillary: 221 mg/dL — ABNORMAL HIGH (ref 65–99)
Glucose-Capillary: 236 mg/dL — ABNORMAL HIGH (ref 65–99)
Glucose-Capillary: 260 mg/dL — ABNORMAL HIGH (ref 65–99)
Glucose-Capillary: 231 mg/dL — ABNORMAL HIGH (ref 65–99)

## 2016-02-09 MED ORDER — FREE WATER
300.0000 mL | Status: DC
  Administered 2016-02-09 – 2016-02-10 (×6): 300 mL

## 2016-02-09 MED ORDER — CETYLPYRIDINIUM CHLORIDE 0.05 % MT LIQD
7.0000 mL | Freq: Two times a day (BID) | OROMUCOSAL | Status: DC
  Administered 2016-02-09 – 2016-02-10 (×4): 7 mL via OROMUCOSAL

## 2016-02-09 MED ORDER — VITAL AF 1.2 CAL PO LIQD
1000.0000 mL | ORAL | Status: DC
  Administered 2016-02-09: 1000 mL
  Filled 2016-02-09 (×6): qty 1000

## 2016-02-09 NOTE — Progress Notes (Signed)
[facility] Progress Note  Patient: Jared [name] ZHY:[id number]   PCP: Feliciana Rossetti, MD DOB: [birth year]   DOA: 01/24/2016   DOS: 02/09/2016   Date of Service: the patient was seen and examined on 02/09/2016  Subjective: Diarrhea continues to be an issue. No other acute events identified overnight. Patient has been able to transition to 6 L of nasal cannula. Nutrition: Remains nothing by mouth until speech evaluation  Brief hospital course: Pt. with PMH of CVA with residual left-sided weakness, dementia, diabetes mellitus; admitted on 01/24/2016, with complaint of ventilator dependent respiratory failure, patient was initially admitted in [facility] on 01/21/2016 after a fall at home with the left humeral neck fracture. Postoperatively the patient was unable to be extubated and had progressive renal failure with volume overload and therefore was transferred to [facility] on 16 2017. Patient received IV antibiotics and with improvement in oxygenation and he was extubated on 01/26/2016. Patient was transferred to hospitalist service and completed antibiotic course on 01/30/2016. 01/31/2016 the patient become febrile with worsening oxygenation with acute hypoxic respiratory failure and was transferred to step down unit on 02/01/2016 with BiPAP support. Currently further plan is continue respiratory support.  Assessment and Plan: 1. Acute respiratory failure with hypoxia (HCC) Sepsis with healthcare associated pneumonia. Patient completed 7 days of IV antibiotics, initially was able to be extubated. With worsening respiratory status transferred back to step down unit. Continuing BiPAP as needed, continue oxygen via nasal cannula Initially was on aztreonam and switched to Primaxin. Last day of antibiotic 02/10/2016. At present stable MRSA negative,  blood cultures negative, urine culture negative. Prognosis is guarded. Continue chest physical therapy when necessary  2. Acute kidney  injury. Hypernatremia. Appreciate nephrology consultation. Probably hemodynamically mediated with hypotension as well as enalapril. Worsen with postoperative hypovolemia. Renal function improving, sodium level gradually getting better. Discontinue D5W Increase free water 300 mL every 4 hours with tube feeding.  3. Diabetes mellitus with hyperglycemia. Patient currently nothing by mouth with concern of aspiration. Continue sliding scale coverage at present. Increase Lantus to 20 units  4. Dysphagia. Protein calorie malnutrition in the context of critical illness. Speech therapy unable to evaluate the patient due to respiratory status. Persistently remains nothing by mouth at present. Palliative care consulted. Per request of family tube feeding was initiated with nasogastric tube. Tube feeding change from Glucerna to vital AF The patient does not show any meaningful improvement in mentation for MBS then patient's prognosis over next few weeks is significantly guarded.  5. Left femoral neck fracture. Surgically repaired on 01/21/2016. Continue heparin for DVT prophylaxis. PT recommends SNF.  6. History of CVA with right-sided weakness. Was given aspirin suppository while remaining nothing by mouth. At present we will transition back to Aggrenox per tube  7. Vascular dementia. Holding Namenda due to nothing by mouth status. Continue patch. Also holding Seroquel for depression.  8. Scrotal ulcer. From severe edema. Also worsening from diarrhea. Discuss with family regarding possible options and at present family would like to hold off on urology consultation for surgical debridement. Use air mattress.  9. Goals of care discussion. Prognosis is guarded for the patient. Discussed with patient's family regarding patient's guarded prognosis as well as worsening respiratory status. Also discussed regarding difficulty improving patient's sodium as well as renal function as well as  regaining adequate nutrition with future risk for aspiration. At present family wants to continue current care as they are seen hopeful for further recovery. I discuss that if the patient  is unable to take oral nutrition or his oxygenation does not improve further that the patient can tolerate a regular nasal cannula hope for meaningful recovery is less. We will continue to monitor. A meeting planned on 02/10/2016.  Pain management: When necessary Tylenol Activity: SNF as per physical therapy Bowel regimen: last BM 02/09/2016 Diet: On tube feeding with Glucerna DVT Prophylaxis: subcutaneous Heparin  Advance goals of care discussion: DNR/DNI as per earlier discussion  Family Communication: no Family was at bedside  Disposition:  Continues to have guarded prognosis with high oxygen requirement.  Consultants: Critical care, nephrology Procedures: Extubation Echocardiogram  Antibiotics: Anti-infectives    Start     Dose/Rate Route Frequency Ordered Stop   02/08/16 1100  imipenem-cilastatin (PRIMAXIN) 500 mg in sodium chloride 0.9 % 100 mL IVPB     500 mg 200 mL/hr over 30 Minutes Intravenous Every 8 hours 02/08/16 0943     02/05/16 0830  imipenem-cilastatin (PRIMAXIN) 250 mg in sodium chloride 0.9 % 100 mL IVPB  Status:  Discontinued     250 mg 200 mL/hr over 30 Minutes Intravenous Every 6 hours 02/05/16 0818 02/08/16 0943   02/04/16 0845  vancomycin (VANCOCIN) 2,000 mg in sodium chloride 0.9 % 500 mL IVPB  Status:  Discontinued     2,000 mg 250 mL/hr over 120 Minutes Intravenous Every 48 hours 02/04/16 0832 02/04/16 1132   02/02/16 1400  aztreonam (AZACTAM) 1 g in dextrose 5 % 50 mL IVPB  Status:  Discontinued     1 g 100 mL/hr over 30 Minutes Intravenous Every 8 hours 02/02/16 1229 02/05/16 0806   02/01/16 2300  vancomycin (VANCOCIN) 1,250 mg in sodium chloride 0.9 % 250 mL IVPB  Status:  Discontinued     1,250 mg 166.7 mL/hr over 90 Minutes Intravenous Every 24 hours 01/31/16  2133 02/02/16 1229   01/31/16 2200  aztreonam (AZACTAM) 2 g in dextrose 5 % 50 mL IVPB  Status:  Discontinued     2 g 100 mL/hr over 30 Minutes Intravenous Every 8 hours 01/31/16 2119 02/02/16 1229   01/31/16 2200  vancomycin (VANCOCIN) 1,750 mg in sodium chloride 0.9 % 500 mL IVPB     1,750 mg 250 mL/hr over 120 Minutes Intravenous  Once 01/31/16 2133 02/01/16 0113   01/27/16 1800  imipenem-cilastatin (PRIMAXIN) 500 mg in sodium chloride 0.9 % 100 mL IVPB  Status:  Discontinued     500 mg 200 mL/hr over 30 Minutes Intravenous Every 8 hours 01/27/16 1206 01/30/16 1616   01/26/16 1200  imipenem-cilastatin (PRIMAXIN) 250 mg in sodium chloride 0.9 % 100 mL IVPB  Status:  Discontinued     250 mg 200 mL/hr over 30 Minutes Intravenous Every 6 hours 01/26/16 1007 01/27/16 1206   01/25/16 0500  imipenem-cilastatin (PRIMAXIN) 250 mg in sodium chloride 0.9 % 100 mL IVPB  Status:  Discontinued     250 mg 200 mL/hr over 30 Minutes Intravenous Every 12 hours 01/24/16 1817 01/26/16 1007   01/24/16 1700  vancomycin (VANCOCIN) 2,000 mg in sodium chloride 0.9 % 500 mL IVPB     2,000 mg 250 mL/hr over 120 Minutes Intravenous  Once 01/24/16 1628 01/24/16 2038   01/24/16 1700  imipenem-cilastatin (PRIMAXIN) 500 mg in sodium chloride 0.9 % 100 mL IVPB     500 mg 200 mL/hr over 30 Minutes Intravenous  Once 01/24/16 1628 01/24/16 1821        Intake/Output Summary (Last 24 hours) at 02/09/16 1937 Last data filed at 02/09/16  1921  Gross per 24 hour  Intake 2639.17 ml  Output   1275 ml  Net 1364.17 ml   Filed Weights   02/07/16 0355 02/08/16 0355 02/09/16 0800  Weight: 97.07 kg (214 lb) 97.977 kg (216 lb) 99.791 kg (220 lb)    Objective: Physical Exam: Filed Vitals:   02/09/16 1300 02/09/16 1412 02/09/16 1600 02/09/16 1919  BP: 109/55  121/50 120/68  Pulse: 70  69 76  Temp: 97.7 F (36.5 C)  98.3 F (36.8 C) 99 F (37.2 C)  TempSrc: Axillary  Axillary Oral  Resp: 26     Height:      Weight:       SpO2: 96% 100%  97%    General: Alert, Awake and Disoriented Appear in moderate distress, Moaning but denies pain Eyes: PERRL, Conjunctiva normal ENT: Oral Mucosa clear moist. Neck: DA JVD, NO Abnormal Mass Or lumps Cardiovascular: S1 and S2 Present, aortic systolic Murmur, Respiratory: Bilateral Air entry equal and Decreased, bilateral Crackles, no wheezes Abdomen: Bowel Sound present, Soft and no tenderness Skin:Scrotal ulcer Extremities: no Pedal edema, no calf tenderness Neurologic: Examination is Difficult due to lethargy as well as disorientation  Data Reviewed: CBC:  Recent Labs Lab 02/04/16 0320 02/05/16 0244 02/06/16 0340 02/07/16 0427 02/09/16 0344  WBC 12.5* 12.3* 13.4* 11.7* 9.3  NEUTROABS  --  10.1*  --  9.6*  --   HGB 9.1* 9.5* 9.6* 9.6* 8.6*  HCT 30.2* 31.1* 32.4* 31.5* 29.6*  MCV 97.7 94.8 97.0 100.0 98.0  PLT 165 187 225 236 243   Basic Metabolic Panel:  Recent Labs Lab 02/05/16 0244 02/06/16 0345 02/06/16 1905 02/07/16 0423 02/07/16 0427 02/08/16 0212 02/09/16 0344  NA 154* 159* 161*  --  158* 156* 151*  K 3.6 3.3* 3.7  --  3.7 3.6 4.0  CL 122* 130* >130*  --  >130* 130* 124*  CO2 22 24 23   --  23 23 23   GLUCOSE 188* 142* 161*  --  225* 208* 238*  BUN 47* 41* 42*  --  41* 38* 34*  CREATININE 3.00* 2.62* 2.35*  --  2.20* 1.90* 1.59*  CALCIUM 7.7* 7.9* 7.8*  --  7.7* 7.5* 7.3*  MG 2.4  --   --   --  2.6* 2.7*  --   PHOS 3.7 3.3  --  3.1  --  3.6 3.3    Liver Function Tests:  Recent Labs Lab 02/05/16 0244 02/06/16 0345 02/08/16 0212 02/09/16 0344  ALBUMIN 1.6* 1.6* 1.5* 1.5*   No results for input(s): LIPASE, AMYLASE in the last 168 hours. No results for input(s): AMMONIA in the last 168 hours. Coagulation Profile: No results for input(s): INR, PROTIME in the last 168 hours. Cardiac Enzymes: No results for input(s): CKTOTAL, CKMB, CKMBINDEX, TROPONINI in the last 168 hours. BNP (last 3 results) No results for input(s): PROBNP  in the last 8760 hours.  CBG:  Recent Labs Lab 02/09/16 0357 02/09/16 0744 02/09/16 1301 02/09/16 1627 02/09/16 1918  GLUCAP 221* 236* 260* 231* 189*    Studies: No results found.   Scheduled Meds: . antiseptic oral rinse  7 mL Mouth Rinse BID  . antiseptic oral rinse  7 mL Mouth Rinse q12n4p  . dipyridamole-aspirin  1 capsule Per Tube Daily  . famotidine  20 mg Per Tube Daily  . free water  300 mL Per Tube Q4H  . heparin  5,000 Units Subcutaneous Q8H  . imipenem-cilastatin  500 mg Intravenous Q8H  .  insulin aspart  0-15 Units Subcutaneous Q4H  . insulin glargine  20 Units Subcutaneous Daily  . ipratropium-albuterol  3 mL Nebulization Q6H  . lactobacillus  1 g Per Tube TID WC  . nitroGLYCERIN  0.2 mg Transdermal Daily  . potassium chloride  40 mEq Oral Daily  . Rivastigmine  13.3 mg Transdermal Daily  . sodium chloride  3 mL Nebulization Q8H   Continuous Infusions: . feeding supplement (VITAL AF 1.2 CAL) 1,000 mL (02/09/16 1329)   PRN Meds: acetaminophen (TYLENOL) oral liquid 160 mg/5 mL, acetaminophen, ipratropium-albuterol, RESOURCE THICKENUP CLEAR  Time spent: 30 minutes  Author: Lynden Oxford, MD [facility] Pager: [phone] 02/09/2016 7:37 PM  If 7PM-7AM, please contact night-coverage at [email or web address], password [facility]

## 2016-02-09 NOTE — Progress Notes (Signed)
CSW continuing to follow for potential SNF placement- per palliative note planning for meeting tomorrow afternoon to discuss disposition  CSW will continue to follow and assist pending palliative/family decisions  Merlyn LotJenna Holoman, [facility] Cedar RapidsCSWA Clinical Social Worker [phone]

## 2016-02-09 NOTE — Progress Notes (Signed)
Inpatient Diabetes Program Recommendations  AACE/ADA: New Consensus Statement on Inpatient Glycemic Control (2015)  Target Ranges:  Prepandial:   less than 140 mg/dL      Peak postprandial:   less than 180 mg/dL (1-2 hours)      Critically ill patients:  140 - 180 mg/dL   Lab Results  Component Value Date   GLUCAP 236* 02/09/2016    Review of Glycemic Control:  Results for Jared [name], Jared [name] (MRN [id number]) as of 02/09/2016 09:54  Ref. Range 02/08/2016 15:43 02/08/2016 20:11 02/08/2016 23:56 02/09/2016 03:57 02/09/2016 07:44  Glucose-Capillary Latest Ref Range: 65-99 mg/dL 409321 (H) 811252 (H) 914210 (H) 221 (H) 236 (H)   Diabetes history: Type 2 diabetes Outpatient Diabetes medications: Lantus 20 units daily, Metformin 500 mg tid with meals, Amaryl 4 mg daily Current orders for Inpatient glycemic control:  Novolog moderate q 4 hours, Lantus 20 units daily  Inpatient Diabetes Program Recommendations: Please consider adding Novolog tube feed coverage 3 units tid with meals.  Thanks, Beryl MeagerJenny Jakson Delpilar, RN, BC-ADM Inpatient Diabetes Coordinator Pager [phone] (8a-5p)

## 2016-02-09 NOTE — Progress Notes (Signed)
Speech Language Pathology Treatment: Dysphagia  Patient Details Name: Jared SalvageRonald Bobier MRN: [id number] DOB: February 25, 1941 Today's Date: 02/09/2016 Time: [id number] SLP Time Calculation (min) (ACUTE ONLY): 26 min  Assessment / Plan / Recommendation Clinical Impression  Patient making steady progress with short term goals towards a po diet today. O2 requirements remain high but improved from previous visit, off HFNC and transitioned to 6 L nasal cannula. Patient seated edge of bed by PT for optimal positioning and alertness. Patient not oriented but conversing with clinician, able to follow basic 1 step commands. Diagnostic po trials provided. Oral transfer of bolus functional, vocal quality remained clear post swallow with trials of thin liquids and pureed solids. Intermittent coughing noted post swallow with thin liquid via large sips (straw and cup) indicative of decreased airway protection. Overall however, patient appearing to be coordinating respirations and swallow better than during previous visits with lethargy now being the largest barrier to progression. Will f/u for treatment 6/27. If patient able to maintain today's same degree of alertness, would consider proceeding with instrumental testing to determine least restrictive po diet. Po intake may initially be minimal however would recommend initiation of a po diet if possible to begin utilization of swallowing musculature.    HPI HPI: 75 yo male with hx dementia, DM, previous CVA with residual L sided weakness [facility] after a fall at home with L femoral neck fx which was surgically repaired. Post op patient was unable to wean from vent. Pt had progressive renal failure, volume overload and was tx to [facility] 6/10 for ongoing care and renal eval. Intubated 6/10-6/12.  Swallow evaluation 6/13 with recs for dysphagia 1, thin liquids and anticipation of D/C on same diet. On 6/17 patient became febrile with possible aspiration  pneumonia and on 6/18 became septic and went into acute hypoxic respiratory failure requiting NRB and transfer to stepdown unit. New orders for swallow evaluation 6/20 given concerns for aspiration.       SLP Plan  Continue with current plan of care     Recommendations  Diet recommendations: NPO Medication Administration: Via alternative means             Oral Care Recommendations: Oral care QID Follow up Recommendations: Skilled Nursing facility Plan: Continue with current plan of care     GO              [facility] Leidy Massar MA, CCC-SLP [phone]   Rayder Sullenger Meryl 02/09/2016, 10:08 AM

## 2016-02-09 NOTE — Progress Notes (Addendum)
Nutrition Consult/Follow Up  DOCUMENTATION CODES:   Obesity unspecified  INTERVENTION:    D/C Glucerna 1.2 formula  Initiate Vital AF 1.2 formula at 20 ml/hr and increase by 10 ml every 4 hours to goal rate of 70 ml/hr  New TF regimen to provide 2016 kcals, 126 gm protein, 1362 ml of free water  NUTRITION DIAGNOSIS:   Inadequate oral intake related to inability to eat as evidenced by NPO status, ongoing  GOAL:   Patient will meet greater than or equal to 90% of their needs, met  MONITOR:   TF tolerance, Diet advancement, Labs, Weight trends, I & O's  ASSESSMENT:   75yo male with hx dementia, DM, previous CVA with residual L sided weakness initially admitted to [facility] 6/7 after a fall at home with L femoral neck fx which was surgically repaired. Post op patient was unable to wean from vent. Pt had progressive renal failure, volume overload and was tx to [facility] 6/10 for ongoing care and renal eval.   Patient extubated 6/12. Pt now NPO >> s/p bedside swallow evaluation 6/20. Previously on a Dys 1-nectar thick liquid diet and receiving Glucerna Shake TID.  CORTRAK feeding tube placed 6/23 >> tube tip projects over duodenum. Glucerna 1.2 formula ordered per RD, however, pt having diarrhea. Will change to semi-elemental/peptide-based formula to optimize tolerance. Noted probiotic (ie Lactinex) started 1 gm TID 6/25.  Diet Order:  Diet NPO time specified  Skin:  Wound (see comment) (full thickness wound to scrotum)  Last BM:  6/26  Height:   Ht Readings from Last 1 Encounters:  02/01/16 5' 10"  (1.778 m)    Weight:   Wt Readings from Last 1 Encounters:  02/09/16 220 lb (99.791 kg)    Ideal Body Weight:  80.9 kg  BMI:  Body mass index is 31.57 kg/(m^2).  Estimated Nutritional Needs:   Kcal:  2000-2200  Protein:  110-130 grams  Fluid:  2- 2.2 L/day  EDUCATION NEEDS:   No education needs identified at this time  Arthur Holms, RD, LDN Pager  #: [phone] After-Hours Pager #: [phone]

## 2016-02-09 NOTE — Progress Notes (Signed)
Physical Therapy Treatment Patient Details Name: Jared [name] MRN: [id number] DOB: Aug 30, 1940 Today's Date: 02/09/2016    History of Present Illness 74yo male with hx dementia, DM, previous CVA with residual L sided weakness [facility] after a fall at home with L femoral neck fx which was surgically repaired. Post op patient was unable to wean from vent. Pt had progressive renal failure, volume overload and was tx to [facility] 6/10 for ongoing care and renal eval. Respiratory status prohibited po intake; NG feeding tube; Palliative consult with pt DNF however continue all other interventions    PT Comments    Patient easily aroused and became very alert at EOB. Followed simple commands ~50% of time (not as well as previous visit).   Follow Up Recommendations  SNF     Equipment Recommendations  None recommended by PT    Recommendations for Other Services       Precautions / Restrictions Precautions Precautions: Fall;Anterior Hip Precaution Booklet Issued: No Restrictions Weight Bearing Restrictions: Yes LLE Weight Bearing: Weight bearing as tolerated    Mobility  Bed Mobility Overal bed mobility: Needs Assistance;+2 for physical assistance;+ 2 for safety/equipment Bed Mobility: Rolling;Sidelying to Sit;Sit to Sidelying Rolling: Max assist Sidelying to sit: Max assist;+2 for physical assistance;HOB elevated     Sit to sidelying: Max assist;+2 for physical assistance General bed mobility comments: pt in left sidelying on arrival and he returned to left sidelying after repositioned supine each time  Transfers                 General transfer comment: Unable   Ambulation/Gait             General Gait Details: unable to stand with PT   Stairs            Wheelchair Mobility    Modified Rankin (Stroke Patients Only)       Balance   Sitting-balance support: No upper extremity supported;Feet supported Sitting  balance-Leahy Scale: Poor Sitting balance - Comments: poor use of UEs with lt lean; sat EOB working on balance and with SLP x 10 minutes Postural control: Left lateral lean                          Cognition Arousal/Alertness: Lethargic Behavior During Therapy: Flat affect Overall Cognitive Status: No family/caregiver present to determine baseline cognitive functioning Area of Impairment: Orientation;Attention;Memory;Following commands;Awareness Orientation Level: Place;Time;Situation Current Attention Level: Sustained Memory: Decreased short-term memory Following Commands: Follows one step commands inconsistently;Follows one step commands with increased time       [name] could not recall correct info after 3 minutes    Exercises      General Comments General comments (skin integrity, edema, etc.): SLP in to assess swallowing while upright      Pertinent Vitals/Pain Pain Assessment: No/denies pain    Home Living                      Prior Function            PT Goals (current goals can now be found in the care plan section) Acute Rehab PT Goals Patient Stated Goal: to get better Time For Goal Achievement: 02/11/16 Progress towards PT goals: Not progressing toward goals - comment (alert, however decr command following)    Frequency  Min 2X/week    PT Plan Current plan remains appropriate;Frequency needs to be  updated    Co-evaluation             End of Session Equipment Utilized During Treatment: Oxygen Activity Tolerance: Patient limited by fatigue Patient left: in bed;Other (comment) (with SLP for swallow eval)     Time: [id number] PT Time Calculation (min) (ACUTE ONLY): 28 min  Charges:  $Therapeutic Activity: 23-37 mins                    G Codes:      Jared [name] 02/09/2016, 11:02 AM Pager [phone]

## 2016-02-09 NOTE — Care Management Important Message (Signed)
Important Message  Patient Details  Name: Jared [name] MRN: [id number] Date of Birth: 06-26-1941   Medicare Important Message Given:  Yes    Jamillah Camilo Abena 02/09/2016, 10:30 AM

## 2016-02-09 NOTE — Progress Notes (Signed)
No charge note  PMT meeting with family at 4:00 pm on Tuesday 6/27.  Algis DownsMarianne York, [place] JerseyPA-C Palliative Medicine Pager: [phone]

## 2016-02-09 NOTE — Progress Notes (Signed)
[facility] Progress Note    Assessment/ Plan:   1 AKI- continues to improve, prob hemodynamically mediated with hx of a fall and enalapril on board, exacerbated by hypovolemia. 2 Hypernatremia - good trend  - will continue the D5W @75ml /hr + free water NG 200ml q4hr.  - will change D5W to 5650ml/hr tomorrow if improving trend continues. 3 Prob asp PNA 4 Dementia?  Subjective:   Responsive but not appropriate. Patient doesn't follow commands.   Objective:   BP 102/48 mmHg  Pulse 70  Temp(Src) 98.1 F (36.7 C) (Oral)  Resp 24  Ht 5\' 10"  (1.778 m)  Wt 97.977 kg (216 lb)  BMI 30.99 kg/m2  SpO2 95%  Intake/Output Summary (Last 24 hours) at 02/09/16 0806 Last data filed at 02/09/16 0700  Gross per 24 hour  Intake 4764.17 ml  Output    800 ml  Net 3964.17 ml   Weight change:   Physical Exam: General appearance: Lethargic Resp: Minimal rales left base, right clear Cardio: regular rate and rhythm, S1, S2 normal, no murmur, click, rub or gallop Extremities: extremities normal, atraumatic, no cyanosis or edema, 2+ RA pulse on the right Abd: hyperactive BS, soft  Imaging: Dg Chest Port 1 View  02/08/2016  CLINICAL DATA:  Follow-up pneumonia, stroke EXAM: PORTABLE CHEST 1 VIEW COMPARISON:  02/05/2016 FINDINGS: Cardiomegaly again noted. There is NG feeding tube with tip in proximal duodenum. No pulmonary edema. Persistent bilateral basilar streaky atelectasis or infiltrate. IMPRESSION: Persistent bilateral basilar streaky atelectasis or infiltrate. NG feeding tube in place. Electronically Signed   By: Natasha MeadLiviu  Pop M.D.   On: 02/08/2016 09:11    Labs: BMET  Recent Labs Lab 02/04/16 0320 02/05/16 0244 02/06/16 0345 02/06/16 1905 02/07/16 0423 02/07/16 0427 02/08/16 0212 02/09/16 0344  NA 151* 154* 159* 161*  --  158* 156* 151*  K 3.7 3.6 3.3* 3.7  --  3.7 3.6 4.0  CL 122* 122* 130* >130*  --  >130* 130* 124*  CO2 21* 22 24 23   --  23 23 23   GLUCOSE 329*  188* 142* 161*  --  225* 208* 238*  BUN 50* 47* 41* 42*  --  41* 38* 34*  CREATININE 3.18* 3.00* 2.62* 2.35*  --  2.20* 1.90* 1.59*  CALCIUM 7.3* 7.7* 7.9* 7.8*  --  7.7* 7.5* 7.3*  PHOS  --  3.7 3.3  --  3.1  --  3.6 3.3   CBC  Recent Labs Lab 02/05/16 0244 02/06/16 0340 02/07/16 0427 02/09/16 0344  WBC 12.3* 13.4* 11.7* 9.3  NEUTROABS 10.1*  --  9.6*  --   HGB 9.5* 9.6* 9.6* 8.6*  HCT 31.1* 32.4* 31.5* 29.6*  MCV 94.8 97.0 100.0 98.0  PLT 187 225 236 243    Medications:    . antiseptic oral rinse  7 mL Mouth Rinse BID  . dipyridamole-aspirin  1 capsule Per Tube Daily  . famotidine  20 mg Per Tube Daily  . free water  200 mL Per Tube Q4H  . heparin  5,000 Units Subcutaneous Q8H  . imipenem-cilastatin  500 mg Intravenous Q8H  . insulin aspart  0-15 Units Subcutaneous Q4H  . insulin glargine  20 Units Subcutaneous Daily  . ipratropium-albuterol  3 mL Nebulization Q6H  . lactobacillus  1 g Per Tube TID WC  . nitroGLYCERIN  0.2 mg Transdermal Daily  . potassium chloride  40 mEq Oral Daily  . Rivastigmine  13.3 mg Transdermal Daily  . sodium chloride  3 mL Nebulization Q8H      Paulene FloorJames Brittan Mapel, MD 02/09/2016, 8:06 AM

## 2016-02-10 ENCOUNTER — Inpatient Hospital Stay (HOSPITAL_COMMUNITY): Payer: Medicare Other

## 2016-02-10 DIAGNOSIS — Z7189 Other specified counseling: Secondary | ICD-10-CM

## 2016-02-10 LAB — GLUCOSE, CAPILLARY
GLUCOSE-CAPILLARY: 115 mg/dL — AB (ref 65–99)
GLUCOSE-CAPILLARY: 101 mg/dL — AB (ref 65–99)
Glucose-Capillary: 178 mg/dL — ABNORMAL HIGH (ref 65–99)
Glucose-Capillary: 251 mg/dL — ABNORMAL HIGH (ref 65–99)
Glucose-Capillary: 247 mg/dL — ABNORMAL HIGH (ref 65–99)
Glucose-Capillary: 206 mg/dL — ABNORMAL HIGH (ref 65–99)

## 2016-02-10 LAB — RENAL FUNCTION PANEL
Albumin: 1.5 g/dL — ABNORMAL LOW (ref 3.5–5.0)
Anion gap: 4 — ABNORMAL LOW (ref 5–15)
BUN: 30 mg/dL — ABNORMAL HIGH (ref 6–20)
CALCIUM: 7.7 mg/dL — AB (ref 8.9–10.3)
CO2: 25 mmol/L (ref 22–32)
CREATININE: 1.45 mg/dL — AB (ref 0.61–1.24)
Chloride: 122 mmol/L — ABNORMAL HIGH (ref 101–111)
GFR, EST AFRICAN AMERICAN: 54 mL/min — AB (ref >60)
GFR, EST NON AFRICAN AMERICAN: 47 mL/min — AB (ref >60)
Glucose, Bld: 195 mg/dL — ABNORMAL HIGH (ref 65–99)
PHOSPHORUS: 3.6 mg/dL (ref 2.5–4.6)
Potassium: 4.6 mmol/L (ref 3.5–5.1)
SODIUM: 151 mmol/L — AB (ref 135–145)

## 2016-02-10 LAB — CBC
HEMATOCRIT: 31.2 % — AB (ref 39.0–52.0)
HEMOGLOBIN: 9.1 g/dL — AB (ref 13.0–17.0)
MCH: 28.7 pg (ref 26.0–34.0)
MCHC: 29.2 g/dL — AB (ref 30.0–36.0)
MCV: 98.4 fL (ref 78.0–100.0)
Platelets: 252 10*3/uL (ref 150–400)
RBC: 3.17 MIL/uL — ABNORMAL LOW (ref 4.22–5.81)
RDW: 17.6 % — AB (ref 11.5–15.5)
WBC: 8.9 10*3/uL (ref 4.0–10.5)

## 2016-02-10 MED ORDER — INSULIN GLARGINE 100 UNIT/ML ~~LOC~~ SOLN
6.0000 [IU] | Freq: Once | SUBCUTANEOUS | Status: AC
  Administered 2016-02-10: 6 [IU] via SUBCUTANEOUS
  Filled 2016-02-10: qty 0.06

## 2016-02-10 MED ORDER — SODIUM CHLORIDE 0.45 % IV SOLN
INTRAVENOUS | Status: DC
  Administered 2016-02-10 – 2016-02-11 (×2): via INTRAVENOUS

## 2016-02-10 MED ORDER — FREE WATER
400.0000 mL | Status: DC
  Administered 2016-02-10: 400 mL

## 2016-02-10 MED ORDER — INSULIN GLARGINE 100 UNIT/ML ~~LOC~~ SOLN
26.0000 [IU] | Freq: Every day | SUBCUTANEOUS | Status: DC
  Filled 2016-02-10: qty 0.26

## 2016-02-10 NOTE — Progress Notes (Signed)
Pt's feeding tube clogged. [facility] tube feeding team notified.

## 2016-02-10 NOTE — Progress Notes (Signed)
[facility] Progress Note  Patient: Jared SalvageRonald Maul ZOX:[id number]   PCP: [name],GREG, MD DOB: 1941/07/31   DOA: 01/24/2016   DOS: 02/10/2016   Date of Service: the patient was seen and examined on 02/10/2016  Subjective: Patient is more lethargic today. No other acute events identified overnight. Failed MBS Nutrition: Remains nothing by mouth. On tube feeding  Brief hospital course: Pt. with PMH of CVA with residual left-sided weakness, dementia, diabetes mellitus; admitted on 01/24/2016, with complaint of ventilator dependent respiratory failure, patient was [facility] after a fall at home with the left humeral neck fracture. Postoperatively the patient was unable to be extubated and had progressive renal failure with volume overload and therefore was [facility] on 73 2017. Patient received IV antibiotics and with improvement in oxygenation and he was extubated on 01/26/2016. Patient was transferred to hospitalist service and completed antibiotic course on 01/30/2016. 01/31/2016 the patient become febrile with worsening oxygenation with acute hypoxic respiratory failure and was transferred to step down unit on 02/01/2016 with BiPAP support. Currently further plan is continue respiratory support.  Assessment and Plan: 1. Acute respiratory failure with hypoxia (HCC) Sepsis with healthcare associated pneumonia. Patient completed 7 days of IV antibiotics, initially was able to be extubated. With worsening respiratory status transferred back to step down unit. Continuing BiPAP as needed, continue oxygen via nasal cannula Initially was on aztreonam and switched to Primaxin. Last day of antibiotic 02/10/2016. At present stable MRSA negative,  blood cultures negative, urine culture negative. Prognosis is guarded. Continue chest physical therapy when necessary  2. Acute kidney injury. Hypernatremia. Appreciate nephrology  consultation. Probably hemodynamically mediated with hypotension as well as enalapril. Worsen with postoperative hypovolemia. Renal function improving, sodium level gradually getting better. Discontinue D5W Increase free water 400 mL every 4 hours with tube feeding. Started on half normal saline.  3. Diabetes mellitus with hyperglycemia. Patient currently nothing by mouth with concern of aspiration. Continue sliding scale coverage at present. Increase Lantus to 26 units  4. Dysphagia. Protein calorie malnutrition in the context of critical illness. Speech therapy unable to evaluate the patient due to respiratory status. Persistently remains nothing by mouth at present. Palliative care consulted. Per request of family tube feeding was initiated with nasogastric tube. Tube feeding change from Glucerna to vital AF The patient does not show any meaningful improvement on MBS  prognosis over next few weeks is significantly guarded.  5. Left femoral neck fracture. Surgically repaired on 01/21/2016. Continue heparin for DVT prophylaxis. PT recommends SNF.  6. History of CVA with right-sided weakness. Was given aspirin suppository while remaining nothing by mouth. At present we will transition back to Aggrenox per tube  7. Vascular dementia. Holding Namenda due to nothing by mouth status. Continue patch. Also holding Seroquel for depression.  8. Scrotal ulcer. From severe edema. Also worsening from diarrhea. Discuss with family regarding possible options and at present family would like to hold off on urology consultation for surgical debridement. Use air mattress.  9. Goals of care discussion. Prognosis is guarded for the patient. Discussed with patient's family regarding patient's guarded prognosis as well as worsening respiratory status. Also discussed regarding difficulty improving patient's sodium as well as renal function as well as regaining adequate nutrition with future risk  for aspiration. At present family wants to continue current care as they are seen hopeful for further recovery. I discuss that if the patient is unable to take oral nutrition or his oxygenation  does not improve further that the patient can tolerate a regular nasal cannula hope for meaningful recovery is less. We will continue to monitor. A meeting planned on 02/10/2016. MBS failed.  Pain management: When necessary Tylenol Activity: SNF as per physical therapy Bowel regimen: last BM 02/10/2016 Diet: On tube feeding with vital AF DVT Prophylaxis: subcutaneous Heparin  Advance goals of care discussion: DNR/DNI as per earlier discussion  Family Communication: no Family was at bedside  Disposition:  Continues to have guarded prognosis with high oxygen requirement.  Consultants: Critical care, nephrology Procedures: Extubation Echocardiogram  Antibiotics: Anti-infectives    Start     Dose/Rate Route Frequency Ordered Stop   02/08/16 1100  imipenem-cilastatin (PRIMAXIN) 500 mg in sodium chloride 0.9 % 100 mL IVPB     500 mg 200 mL/hr over 30 Minutes Intravenous Every 8 hours 02/08/16 0943 02/11/16 1059   02/05/16 0830  imipenem-cilastatin (PRIMAXIN) 250 mg in sodium chloride 0.9 % 100 mL IVPB  Status:  Discontinued     250 mg 200 mL/hr over 30 Minutes Intravenous Every 6 hours 02/05/16 0818 02/08/16 0943   02/04/16 0845  vancomycin (VANCOCIN) 2,000 mg in sodium chloride 0.9 % 500 mL IVPB  Status:  Discontinued     2,000 mg 250 mL/hr over 120 Minutes Intravenous Every 48 hours 02/04/16 0832 02/04/16 1132   02/02/16 1400  aztreonam (AZACTAM) 1 g in dextrose 5 % 50 mL IVPB  Status:  Discontinued     1 g 100 mL/hr over 30 Minutes Intravenous Every 8 hours 02/02/16 1229 02/05/16 0806   02/01/16 2300  vancomycin (VANCOCIN) 1,250 mg in sodium chloride 0.9 % 250 mL IVPB  Status:  Discontinued     1,250 mg 166.7 mL/hr over 90 Minutes Intravenous Every 24 hours 01/31/16 2133 02/02/16 1229    01/31/16 2200  aztreonam (AZACTAM) 2 g in dextrose 5 % 50 mL IVPB  Status:  Discontinued     2 g 100 mL/hr over 30 Minutes Intravenous Every 8 hours 01/31/16 2119 02/02/16 1229   01/31/16 2200  vancomycin (VANCOCIN) 1,750 mg in sodium chloride 0.9 % 500 mL IVPB     1,750 mg 250 mL/hr over 120 Minutes Intravenous  Once 01/31/16 2133 02/01/16 0113   01/27/16 1800  imipenem-cilastatin (PRIMAXIN) 500 mg in sodium chloride 0.9 % 100 mL IVPB  Status:  Discontinued     500 mg 200 mL/hr over 30 Minutes Intravenous Every 8 hours 01/27/16 1206 01/30/16 1616   01/26/16 1200  imipenem-cilastatin (PRIMAXIN) 250 mg in sodium chloride 0.9 % 100 mL IVPB  Status:  Discontinued     250 mg 200 mL/hr over 30 Minutes Intravenous Every 6 hours 01/26/16 1007 01/27/16 1206   01/25/16 0500  imipenem-cilastatin (PRIMAXIN) 250 mg in sodium chloride 0.9 % 100 mL IVPB  Status:  Discontinued     250 mg 200 mL/hr over 30 Minutes Intravenous Every 12 hours 01/24/16 1817 01/26/16 1007   01/24/16 1700  vancomycin (VANCOCIN) 2,000 mg in sodium chloride 0.9 % 500 mL IVPB     2,000 mg 250 mL/hr over 120 Minutes Intravenous  Once 01/24/16 1628 01/24/16 2038   01/24/16 1700  imipenem-cilastatin (PRIMAXIN) 500 mg in sodium chloride 0.9 % 100 mL IVPB     500 mg 200 mL/hr over 30 Minutes Intravenous  Once 01/24/16 1628 01/24/16 1821        Intake/Output Summary (Last 24 hours) at 02/10/16 1649 Last data filed at 02/10/16 1504  Gross per 24 hour  Intake 2401.17 ml  Output   1725 ml  Net 676.17 ml   Filed Weights   02/08/16 0355 02/09/16 0800 02/10/16 0459  Weight: 97.977 kg (216 lb) 99.791 kg (220 lb) 100.699 kg (222 lb)    Objective: Physical Exam: Filed Vitals:   02/10/16 0828 02/10/16 1154 02/10/16 1300 02/10/16 1559  BP: 119/66 100/67 138/69 111/54  Pulse: 72 75 74 75  Temp:  98.8 F (37.1 C)  97.6 F (36.4 C)  TempSrc:  Axillary  Axillary  Resp: 30 27 30 25   Height:      Weight:      SpO2: 96% 97% 98%  95%    General: Alert, Awake and Disoriented Appear in moderate distress, Moaning but denies pain Eyes: PERRL, Conjunctiva normal ENT: Oral Mucosa clear moist. Cardiovascular: S1 and S2 Present, aortic systolic Murmur, Respiratory: Bilateral Air entry equal and Decreased, bilateral Crackles, no wheezes Abdomen: Bowel Sound present, Soft and no tenderness Skin:Scrotal ulcer Extremities: no Pedal edema, no calf tenderness Neurologic: No focal deficit  Data Reviewed: CBC:  Recent Labs Lab 02/05/16 0244 02/06/16 0340 02/07/16 0427 02/09/16 0344 02/10/16 0303  WBC 12.3* 13.4* 11.7* 9.3 8.9  NEUTROABS 10.1*  --  9.6*  --   --   HGB 9.5* 9.6* 9.6* 8.6* 9.1*  HCT 31.1* 32.4* 31.5* 29.6* 31.2*  MCV 94.8 97.0 100.0 98.0 98.4  PLT 187 225 236 243 252   Basic Metabolic Panel:  Recent Labs Lab 02/05/16 0244 02/06/16 0345 02/06/16 1905 02/07/16 0423 02/07/16 0427 02/08/16 0212 02/09/16 0344 02/10/16 0303  NA 154* 159* 161*  --  158* 156* 151* 151*  K 3.6 3.3* 3.7  --  3.7 3.6 4.0 4.6  CL 122* 130* >130*  --  >130* 130* 124* 122*  CO2 22 24 23   --  23 23 23 25   GLUCOSE 188* 142* 161*  --  225* 208* 238* 195*  BUN 47* 41* 42*  --  41* 38* 34* 30*  CREATININE 3.00* 2.62* 2.35*  --  2.20* 1.90* 1.59* 1.45*  CALCIUM 7.7* 7.9* 7.8*  --  7.7* 7.5* 7.3* 7.7*  MG 2.4  --   --   --  2.6* 2.7*  --   --   PHOS 3.7 3.3  --  3.1  --  3.6 3.3 3.6    Liver Function Tests:  Recent Labs Lab 02/05/16 0244 02/06/16 0345 02/08/16 0212 02/09/16 0344 02/10/16 0303  ALBUMIN 1.6* 1.6* 1.5* 1.5* 1.5*   No results for input(s): LIPASE, AMYLASE in the last 168 hours. No results for input(s): AMMONIA in the last 168 hours. Coagulation Profile: No results for input(s): INR, PROTIME in the last 168 hours. Cardiac Enzymes: No results for input(s): CKTOTAL, CKMB, CKMBINDEX, TROPONINI in the last 168 hours. BNP (last 3 results) No results for input(s): PROBNP in the last 8760  hours.  CBG:  Recent Labs Lab 02/09/16 2337 02/10/16 0256 02/10/16 0800 02/10/16 1152 02/10/16 1559  GLUCAP 193* 178* 251* 247* 206*    Studies: Dg Swallowing Func-speech Pathology  02/10/2016  Objective Swallowing Evaluation: Type of Study: MBS-Modified Barium Swallow Study Patient Details Name: Kennedy Bohanon MRN: [id number] Date of Birth: [birth year] Today's Date: 02/10/2016 Time: SLP Start Time (ACUTE ONLY): 1350-SLP Stop Time (ACUTE ONLY): 1420 SLP Time Calculation (min) (ACUTE ONLY): 30 min Past Medical History: Past Medical History Diagnosis Date . High blood pressure  . GERD (gastroesophageal reflux disease)  . Bruises easily  . Anxiety  . Hearing loss  .  Diabetes mellitus  . Stroke (HCC)  . Heart attack [facility])  Past Surgical History: Past Surgical History Procedure Laterality Date . Gallbladder surgery   HPI: 75 yo male with hx dementia, DM, previous CVA with residual L sided weakness initially admitted to [facility] 6/7 after a fall at home with L femoral neck fx which was surgically repaired. Post op patient was unable to wean from vent. Pt had progressive renal failure, volume overload and was tx to [facility] 6/10 for ongoing care and renal eval. Intubated 6/10-6/12.  Swallow evaluation 6/13 with recs for dysphagia 1, thin liquids and anticipation of D/C on same diet. On 6/17 patient became febrile with possible aspiration pneumonia and on 6/18 became septic and went into acute hypoxic respiratory failure requiting NRB and transfer to stepdown unit. New orders for swallow evaluation 6/20 given concerns for aspiration.  Subjective: confused - at baseline per family Assessment / Plan / Recommendation CHL IP CLINICAL IMPRESSIONS 02/10/2016 Therapy Diagnosis Moderate pharyngeal phase dysphagia;Moderate oral phase dysphagia Clinical Impression Pt presents with a mod-severe generalized dysphagia with poor oral control and propulsion; delayed swallow with sluggish mobility of hyolaryngeal musculature;  significant pharyngeal retention post-swallow; aspiration of nectar-thick liquids upon the swallow and frank penetration with trace aspiration of thicker residuals after the swallow.  Aspiration did not consistently elicit a cough response. Pt had difficulty following commands for head postures/strategies to improve airway protection.  He was less alert this afternoon than during earlier session, but study reflects performance during episodes of fatigue.  Recommend NPO with trial POs with SLP pending GOC meeting. Impact on safety and function Severe aspiration risk   CHL IP TREATMENT RECOMMENDATION 02/03/2016 Treatment Recommendations Therapy as outlined in treatment plan below   Prognosis 02/03/2016 Prognosis for Safe Diet Advancement Fair Barriers to Reach Goals Cognitive deficits Barriers/Prognosis Comment -- CHL IP DIET RECOMMENDATION 02/10/2016 SLP Diet Recommendations NPO Liquid Administration via -- Medication Administration -- Compensations -- Postural Changes --   CHL IP OTHER RECOMMENDATIONS 02/10/2016 Recommended Consults -- Oral Care Recommendations Oral care QID Other Recommendations --   CHL IP FOLLOW UP RECOMMENDATIONS 02/10/2016 Follow up Recommendations Skilled Nursing facility   [facility] IP FREQUENCY AND DURATION 02/03/2016 Speech Therapy Frequency (ACUTE ONLY) min 2x/week Treatment Duration 1 week      CHL IP ORAL PHASE 02/10/2016 Oral Phase Impaired Oral - honey Teaspoon -- Oral - Pudding Cup -- Oral - honeyTeaspoon Incomplete tongue to palate contact;Reduced posterior propulsion;Delayed oral transit;Weak lingual manipulation Oral - Honey Cup -- Oral - Nectar Teaspoon Incomplete tongue to palate contact;Reduced posterior propulsion;Delayed oral transit;Weak lingual manipulation Oral - Nectar Cup -- Oral - Nectar Straw -- Oral - Thin Teaspoon -- Oral - Thin Cup -- Oral - Thin Straw -- Oral - Puree Incomplete tongue to palate contact;Reduced posterior propulsion;Delayed oral transit;Weak lingual manipulation  Oral - Mech Soft -- Oral - Regular -- Oral - Multi-Consistency -- Oral - Pill -- Oral Phase - Comment --  CHL IP PHARYNGEAL PHASE 02/10/2016 Pharyngeal Phase Impaired Pharyngeal- Pudding Teaspoon -- Pharyngeal -- Pharyngeal- Pudding Cup -- Pharyngeal -- Pharyngeal- Honey Teaspoon -- Pharyngeal -- Pharyngeal- Honey Cup -- Pharyngeal -- Pharyngeal- Nectar Teaspoon -- Pharyngeal -- Pharyngeal- Nectar Cup -- Pharyngeal -- Pharyngeal- Nectar Straw -- Pharyngeal -- Pharyngeal- Thin Teaspoon -- Pharyngeal -- Pharyngeal- Thin Cup -- Pharyngeal -- Pharyngeal- Thin Straw -- Pharyngeal -- Pharyngeal- Puree -- Pharyngeal -- Pharyngeal- Mechanical Soft -- Pharyngeal -- Pharyngeal- Regular -- Pharyngeal -- Pharyngeal- Multi-consistency -- Pharyngeal -- Pharyngeal- Pill --  Pharyngeal -- Pharyngeal Comment --  CHL IP CERVICAL ESOPHAGEAL PHASE 02/10/2016 Cervical Esophageal Phase (No Data) Pudding Teaspoon -- Pudding Cup -- Honey Teaspoon -- Honey Cup -- Nectar Teaspoon -- Nectar Cup -- Nectar Straw -- Thin Teaspoon -- Thin Cup -- Thin Straw -- Puree -- Mechanical Soft -- Regular -- Multi-consistency -- Pill -- Cervical Esophageal Comment -- No flowsheet data found. Blenda Mounts Laurice 02/10/2016, 2:53 PM                Scheduled Meds: . antiseptic oral rinse  7 mL Mouth Rinse BID  . antiseptic oral rinse  7 mL Mouth Rinse q12n4p  . dipyridamole-aspirin  1 capsule Per Tube Daily  . famotidine  20 mg Per Tube Daily  . free water  400 mL Per Tube Q4H  . heparin  5,000 Units Subcutaneous Q8H  . imipenem-cilastatin  500 mg Intravenous Q8H  . insulin aspart  0-15 Units Subcutaneous Q4H  . [START ON 02/11/2016] insulin glargine  26 Units Subcutaneous Daily  . ipratropium-albuterol  3 mL Nebulization Q6H  . lactobacillus  1 g Per Tube TID WC  . nitroGLYCERIN  0.2 mg Transdermal Daily  . potassium chloride  40 mEq Oral Daily  . Rivastigmine  13.3 mg Transdermal Daily  . sodium chloride  3 mL Nebulization Q8H   Continuous  Infusions: . sodium chloride 75 mL/hr at 02/10/16 1057  . feeding supplement (VITAL AF 1.2 CAL) 1,000 mL (02/09/16 1329)   PRN Meds: acetaminophen (TYLENOL) oral liquid 160 mg/5 mL, acetaminophen, ipratropium-albuterol, RESOURCE THICKENUP CLEAR  Time spent: 30 minutes  Author: Lynden Oxford, MD [facility] Pager: [phone] 02/10/2016 4:49 PM  If 7PM-7AM, please contact night-coverage at [email or web address], password [facility]

## 2016-02-10 NOTE — Progress Notes (Signed)
Daily Progress Note   Patient Name: Jared [name]       Date: 02/10/2016 DOB: 11-29-1940  Age: 75 y.o. MRN#: [id number] Attending Physician: Lavina Hamman, MD Primary Care Physician: Gilford Rile, MD Admit Date: 01/24/2016  Mr. Givanni Staron is a 75 yo male with a pmh of CVA with residual right sided weakness, DM, MI, and dementia was transferred from [facility] with [facility] for renal consultation as he was found to have AKI after a fall and femoral neck fracture.  He underwent hip repair on 6/7 at [facility] and was transferred to [facility] on 6/10.  He was able to wean from the vent to high flow nasal cannula and now to 5L N/C.  He is at high risk for aspiration and currently has and N/G in place receiving tube feeds. He did very poorly on his modified barium study today.  His kidney function is improving. He has a significant scrotal ulcer that is unlikely to heal due to poor nutrition and he is not a candidate for surgical debridment.    Reason for Consultation/Follow-up: Establishing goals of care  Subjective: I met with Mr. Davisson early in the day.  He did not have any complaints.  He was confused but attempted to answer my questions and commands appropriately.  I met at bedside with his wife, son (durable power of attorney), as well as his grand-daughter Payton. His son tells me that prior to his stroke Mr. Saxton was a talkative man who loved people.  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  He loved to tell stories and was known for telling a great many.  I asked his son and wife how he was doing medically.  His son replied "he's improving, but he's still not where he needs to be".  We briefly highlighted his areas of improvement.  We then  discussed that he is now bedridden, he remains confused, and he is aspirating.  We discussed recurrent aspiration pneumonia as being a terminal condition.  We discussed Mr. Wahlen nutritional status - his albumin is 1.5.  His wounds will not heal and he is not a candidate for surgical debridment.  I explained comfort feeds with the risk of aspiration vs PEG feedings.  PEG feedings still carry a  risk of aspiration on saliva or on a stomach that is overly full of Jevity.  I asked what their feelings were about a PEG tube.  They had no immediate answers.   I explained that Mr. Russomanno is hospice house eligible and we discussed "weeks" at a very comfortable hospice house with comfort feeds and 24 hour nursing care vs weeks to months with a PEG tube in place living at [facility].  When I asked what Mr. Wiler would tell me if he could - the son and wife agreed that Mr. Farias would not want to live in a SNF.  He was very unhappy at SNF just prior to admission.    So I asked once more:  Place a PEG and have X number of months living in a SNF?  Or Start comfort feeds and discharge to hospice for comfort care.  He will likely die in 2-4 weeks.  Again the family had no immediate answers.  They wanted to talk it over with the patient's daughter and I will contact them again tomorrow morning to follow up.  Length of Stay: 17  Current Medications: Scheduled Meds:  . antiseptic oral rinse  7 mL Mouth Rinse BID  . antiseptic oral rinse  7 mL Mouth Rinse q12n4p  . dipyridamole-aspirin  1 capsule Per Tube Daily  . famotidine  20 mg Per Tube Daily  . free water  400 mL Per Tube Q4H  . heparin  5,000 Units Subcutaneous Q8H  . imipenem-cilastatin  500 mg Intravenous Q8H  . insulin aspart  0-15 Units Subcutaneous Q4H  . [START ON 02/11/2016] insulin glargine  26 Units Subcutaneous Daily  . ipratropium-albuterol  3 mL Nebulization Q6H  . lactobacillus  1 g Per Tube TID WC  . nitroGLYCERIN  0.2 mg Transdermal Daily  . potassium  chloride  40 mEq Oral Daily  . Rivastigmine  13.3 mg Transdermal Daily  . sodium chloride  3 mL Nebulization Q8H    Continuous Infusions: . sodium chloride 75 mL/hr at 02/10/16 1057  . feeding supplement (VITAL AF 1.2 CAL) 1,000 mL (02/09/16 1329)    PRN Meds: acetaminophen (TYLENOL) oral liquid 160 mg/5 mL, acetaminophen, ipratropium-albuterol, RESOURCE THICKENUP CLEAR  Physical Exam   Well developed chronically ill appearing man.  Lying in bed unable to turn himself. CV rrr Resp nad, N/G in place with tube feedings going Abdomen:  Soft Extremities:  Right sided weakness Neuro:  Patient attempts to answer but his speech is difficult to understand and he is confused.         Vital Signs: BP 111/54 mmHg  Pulse 75  Temp(Src) 97.6 F (36.4 C) (Axillary)  Resp 25  Ht 5' 10"  (1.778 m)  Wt 100.699 kg (222 lb)  BMI 31.85 kg/m2  SpO2 95% SpO2: SpO2: 95 % O2 Device: O2 Device: Nasal Cannula O2 Flow Rate: O2 Flow Rate (L/min): 5 L/min  Intake/output summary:  Intake/Output Summary (Last 24 hours) at 02/10/16 1701 Last data filed at 02/10/16 1504  Gross per 24 hour  Intake 2401.17 ml  Output   1725 ml  Net 676.17 ml   LBM: Last BM Date: 02/09/16 Baseline Weight: Weight: 101.2 kg (223 lb 1.7 oz) Most recent weight: Weight: 100.699 kg (222 lb)       Palliative Assessment/Data:    Flowsheet Rows        Most Recent Value   Intake Tab    Referral Department  Hospitalist   Unit at Time of Referral  Intermediate Care Unit   Palliative Care Primary Diagnosis  Sepsis/Infectious Disease   Date Notified  02/05/16   Palliative Care Type  New Palliative care   Reason for referral  Clarify Goals of Care   Date of Admission  01/24/16   Date first seen by Palliative Care  02/05/16   # of days Palliative referral response time  0 Day(s)   # of days IP prior to Palliative referral  12   Clinical Assessment    Psychosocial & Spiritual Assessment    Palliative Care Outcomes        Patient Active Problem List   Diagnosis Date Noted  . Acute respiratory failure with hypoxemia ([place])   . DNR (do not resuscitate)   . Palliative care encounter   . Dysphagia   . Uncontrolled diabetes mellitus with hyperglycemia, with long-term current use of insulin ([place]) 01/28/2016  . Hypernatremia 01/28/2016  . Acute kidney injury ([place]) 01/28/2016  . Acute respiratory failure with hypoxia ([place]) 01/28/2016  . HCAP (healthcare-associated pneumonia) 01/28/2016  . 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 Acute respiratory failure ([place]) 01/24/2016  . Diabetes mellitus 03/25/2011  . Hearing loss 03/25/2011  . GERD (gastroesophageal reflux disease) 03/25/2011  . High blood pressure 03/25/2011  . Anxiety 03/25/2011    Palliative Care Assessment & Plan    Assessment: Patient is hospice house eligible.  Family has the option of choosing full comfort and hospice house or placing a PEG and continuing at SNF.  Patient is still on a high volume of oxygen and highly likely to decompensate.  Recommendations/Plan:  PMT recommendation given Mr. Harrell's hemiplegia, bedbound status, non-healing scrotal wound, severe dysphagia with high risk for aspiration and confusion is for full comfort.  PMT will follow up with the family again tomorrow and attempt to elicit their decision.  Goals of Care and Additional Recommendations:  Code Status: DNR  Prognosis:   < 2 weeks without artificial feeding.  With a PEG weeks to months.  Discharge Planning:  To Be Determined  Care plan was discussed with patient's family at bedside.  Thank you for allowing the Palliative Medicine Team to assist in the care of this patient.   Time In: 4:00 Time Out: 4:50 Total Time 50 Prolonged Time Billed no      Greater than 50%  of this time was spent counseling and coordinating care related to the above assessment and plan.  Imogene Burn, PA-C Palliative Medicine Pager: [phone]   Please contact Palliative  Medicine Team phone at [phone] for questions and concerns.

## 2016-02-10 NOTE — Progress Notes (Signed)
Occupational Therapy Treatment Patient Details Name: Jared SalvageRonald [name] MRN: [id number] DOB: 1941/07/07 Today's Date: 02/10/2016    History of present illness 74yo male with hx dementia, DM, previous CVA with residual L sided weakness [facility] after a fall at home with L femoral neck fx which was surgically repaired. Post op patient was unable to wean from vent. Pt had progressive renal failure, volume overload and was tx to [facility] 6/10 for ongoing care and renal eval. Respiratory status prohibited po intake; NG feeding tube; Palliative consult with pt DNF however continue all other interventions   OT comments  Pt moved to EOB with max A, which is improved.  He tolerated EOB sitting x 6 mins with min A before fatiguing.  He follows one step commands ~75% of time with a delay.  VSS on 6L supplemental 02  Follow Up Recommendations  SNF;Supervision/Assistance - 24 hour    Equipment Recommendations  None recommended by OT    Recommendations for Other Services      Precautions / Restrictions Precautions Precautions: Fall;Anterior Hip Restrictions Weight Bearing Restrictions: Yes LLE Weight Bearing: Weight bearing as tolerated       Mobility Bed Mobility Overal bed mobility: Needs Assistance;+2 for physical assistance;+ 2 for safety/equipment Bed Mobility: Supine to Sit;Sit to Supine     Supine to sit: Max assist Sit to supine: Max assist   General bed mobility comments: Pt was able to assist with raising trunk and with lifting Rt LE back onto bed   Transfers                 General transfer comment: unable to safely attempt     Balance Overall balance assessment: Needs assistance Sitting-balance support: Feet supported;Single extremity supported Sitting balance-Leahy Scale: Poor Sitting balance - Comments: Pt sat EOB x ~6 mins with min A.  Lt lateral lean.  He will attempt to correct posture with min cues  Postural control: Left lateral  lean                         ADL Overall ADL's : Needs assistance/impaired                                       General ADL Comments: Does not attempt to initiate simple self care. Pt requires total A       Vision                     Perception     Praxis      Cognition   Behavior During Therapy: Flat affect Overall Cognitive Status: No family/caregiver present to determine baseline cognitive functioning                  General Comments: Pt with h/o dementia.  [facility]  He follows simple commands ~70% of time with increased time     Extremity/Trunk Assessment               Exercises     Shoulder Instructions       General Comments      Pertinent Vitals/ Pain       Pain Assessment: Faces Faces Pain Scale: Hurts little more Pain Location: Lt hip with bed mobility  Pain Descriptors / Indicators: Grimacing Pain Intervention(s): Monitored during session;Limited  activity within patient's tolerance  Home Living                                          Prior Functioning/Environment              Frequency Min 2X/week     Progress Toward Goals  OT Goals(current goals can now be found in the care plan section)  Progress towards OT goals:  (very slow progression )  ADL Goals Pt Will Perform Grooming: with min assist;sitting Pt Will Transfer to Toilet: with mod assist;bedside commode;stand pivot transfer Additional ADL Goal #1: Pt will engage in bed mobility with min assist as a precursor for ADLs and to decrease burden of care Additional ADL Goal #2: Pt will perform sit to/from stands with mod assist as a precursor for ADLs and to decrease burden of care   Plan Discharge plan remains appropriate    Co-evaluation                 End of Session Equipment Utilized During Treatment: Oxygen   Activity Tolerance Patient limited by fatigue   Patient  Left in bed;with call bell/phone within reach   Nurse Communication Mobility status        Time: [id number] OT Time Calculation (min): 13 min  Charges: OT General Charges $OT Visit: 1 Procedure OT Treatments $Therapeutic Activity: 8-22 mins  Luisdaniel Kenton M 02/10/2016, 11:41 AM

## 2016-02-10 NOTE — Progress Notes (Signed)
Speech Language Pathology Treatment: Dysphagia  Patient Details Name: Jared [name] MRN: [id number] DOB: Dec 28, 1940 Today's Date: 02/10/2016 Time: [id number] SLP Time Calculation (min) (ACUTE ONLY): 14 min  Assessment / Plan / Recommendation Clinical Impression  Pt with notable improvements today with regard to LOA, attention, clarity of speech.  He actively participated in trial PO consumption with improved respiratory/swallowing coordination, persisting s/s of dysphagia but with readiness today to participate in MBS.  Min verbal cues provided for bolus attention, posture; signs of potential for aspiration of liquids but respiratory function overall is better.  Recommend proceeding with MBS - scheduled for 1:30 today. D/W RN.    HPI HPI: 75 yo male with hx dementia, DM, previous CVA with residual L sided weakness [facility] after a fall at home with L femoral neck fx which was surgically repaired. Post op patient was unable to wean from vent. Pt had progressive renal failure, volume overload and was tx to [facility] 6/10 for ongoing care and renal eval. Intubated 6/10-6/12.  Swallow evaluation 6/13 with recs for dysphagia 1, thin liquids and anticipation of D/C on same diet. On 6/17 patient became febrile with possible aspiration pneumonia and on 6/18 became septic and went into acute hypoxic respiratory failure requiting NRB and transfer to stepdown unit. New orders for swallow evaluation 6/20 given concerns for aspiration.       SLP Plan  MBS     Recommendations  Diet recommendations: NPO             Oral Care Recommendations: Oral care QID Follow up Recommendations: Skilled Nursing facility Plan: MBS     GO               Jared [name] [place], [place] CCC/SLP Pager [phone]  Blenda MountsCouture, Aislynn Cifelli Laurice 02/10/2016, 12:00 PM

## 2016-02-10 NOTE — Progress Notes (Signed)
Speech Pathology:  MBSS complete. Full report located under chart review in imaging section. Poor performance - Continue NPO. High aspiration risk regardless of consistencies. Pt/family to meet with Palliative Medicine this afternoon.  Will follow for GOC.  Jared [name] L. Samson Fred[place]ure, [place] CCC/SLP Pager [phone]

## 2016-02-10 NOTE — Progress Notes (Signed)
[facility] Progress Note    Assessment/ Plan:   1 AKI- continues to improve, prob hemodynamically mediated with hx of a fall and enalapril on board, exacerbated by hypovolemia. 2 Hypernatremia - good trend and stable with a SNa of 151. -  D5W discontinued. - Free water increased to 300ml q4hr VIA PEG --> increase to 400ml q4hr on 6/28 if Na not decreasing. - Will also check free water clearance in the urine to determine if pt is receiving enough free water. Free water deficit is ~4L and pt receiving 1800ml via peg. Will increase to 400ml q4hr and add 1/2NS @75ml  hr x1L - Requested Urine Na + K for tomorrow to calculate free water deficit 3 Prob asp PNA 4 Dementia?  Subjective:   Responsive but not appropriate. Patient doesn't follow commands.   Objective:   BP 119/66 mmHg  Pulse 72  Temp(Src) 98 F (36.7 C) (Oral)  Resp 30  Ht 5\' 10"  (1.778 m)  Wt 100.699 kg (222 lb)  BMI 31.85 kg/m2  SpO2 96%  Intake/Output Summary (Last 24 hours) at 02/10/16 0943 Last data filed at 02/10/16 0700  Gross per 24 hour  Intake 3011.17 ml  Output   1650 ml  Net 1361.17 ml   Weight change:   Physical Exam: General appearance: Lethargic Resp: Minimal rales left base, right clear Cardio: regular rate and rhythm, S1, S2 normal, no murmur, click, rub or gallop Extremities: extremities normal, atraumatic, no cyanosis or edema, 2+ RA pulse on the right Abd: hyperactive BS, soft  Imaging: No results found.  Labs: BMET  Recent Labs Lab 02/05/16 0244 02/06/16 0345 02/06/16 1905 02/07/16 0423 02/07/16 0427 02/08/16 0212 02/09/16 0344 02/10/16 0303  NA 154* 159* 161*  --  158* 156* 151* 151*  K 3.6 3.3* 3.7  --  3.7 3.6 4.0 4.6  CL 122* 130* >130*  --  >130* 130* 124* 122*  CO2 22 24 23   --  23 23 23 25   GLUCOSE 188* 142* 161*  --  225* 208* 238* 195*  BUN 47* 41* 42*  --  41* 38* 34* 30*  CREATININE 3.00* 2.62* 2.35*  --  2.20* 1.90* 1.59* 1.45*  CALCIUM 7.7*  7.9* 7.8*  --  7.7* 7.5* 7.3* 7.7*  PHOS 3.7 3.3  --  3.1  --  3.6 3.3 3.6   CBC  Recent Labs Lab 02/05/16 0244 02/06/16 0340 02/07/16 0427 02/09/16 0344 02/10/16 0303  WBC 12.3* 13.4* 11.7* 9.3 8.9  NEUTROABS 10.1*  --  9.6*  --   --   HGB 9.5* 9.6* 9.6* 8.6* 9.1*  HCT 31.1* 32.4* 31.5* 29.6* 31.2*  MCV 94.8 97.0 100.0 98.0 98.4  PLT 187 225 236 243 252    Medications:    . antiseptic oral rinse  7 mL Mouth Rinse BID  . antiseptic oral rinse  7 mL Mouth Rinse q12n4p  . dipyridamole-aspirin  1 capsule Per Tube Daily  . famotidine  20 mg Per Tube Daily  . free water  300 mL Per Tube Q4H  . heparin  5,000 Units Subcutaneous Q8H  . imipenem-cilastatin  500 mg Intravenous Q8H  . insulin aspart  0-15 Units Subcutaneous Q4H  . insulin glargine  20 Units Subcutaneous Daily  . ipratropium-albuterol  3 mL Nebulization Q6H  . lactobacillus  1 g Per Tube TID WC  . nitroGLYCERIN  0.2 mg Transdermal Daily  . potassium chloride  40 mEq Oral Daily  . Rivastigmine  13.3 mg Transdermal  Daily  . sodium chloride  3 mL Nebulization Q8H      Paulene FloorJames Lin, MD 02/10/2016, 9:43 AM

## 2016-02-11 DIAGNOSIS — R0600 Dyspnea, unspecified: Secondary | ICD-10-CM | POA: Insufficient documentation

## 2016-02-11 DIAGNOSIS — Z515 Encounter for palliative care: Secondary | ICD-10-CM | POA: Insufficient documentation

## 2016-02-11 LAB — RENAL FUNCTION PANEL
Albumin: 1.6 g/dL — ABNORMAL LOW (ref 3.5–5.0)
Anion gap: 4 — ABNORMAL LOW (ref 5–15)
BUN: 27 mg/dL — AB (ref 6–20)
CHLORIDE: 122 mmol/L — AB (ref 101–111)
CO2: 23 mmol/L (ref 22–32)
CREATININE: 1.27 mg/dL — AB (ref 0.61–1.24)
Calcium: 7.8 mg/dL — ABNORMAL LOW (ref 8.9–10.3)
GFR calc non Af Amer: 55 mL/min — ABNORMAL LOW (ref >60)
Glucose, Bld: 145 mg/dL — ABNORMAL HIGH (ref 65–99)
POTASSIUM: 4.7 mmol/L (ref 3.5–5.1)
Phosphorus: 4.1 mg/dL (ref 2.5–4.6)
Sodium: 149 mmol/L — ABNORMAL HIGH (ref 135–145)

## 2016-02-11 LAB — GLUCOSE, CAPILLARY
GLUCOSE-CAPILLARY: 150 mg/dL — AB (ref 65–99)
Glucose-Capillary: 137 mg/dL — ABNORMAL HIGH (ref 65–99)
Glucose-Capillary: 169 mg/dL — ABNORMAL HIGH (ref 65–99)
Glucose-Capillary: 176 mg/dL — ABNORMAL HIGH (ref 65–99)

## 2016-02-11 LAB — MAGNESIUM
MAGNESIUM: 2.7 mg/dL — AB (ref 1.7–2.4)
Magnesium: 2.6 mg/dL — ABNORMAL HIGH (ref 1.7–2.4)

## 2016-02-11 LAB — POTASSIUM: POTASSIUM: 5 mmol/L (ref 3.5–5.1)

## 2016-02-11 MED ORDER — POLYVINYL ALCOHOL 1.4 % OP SOLN
1.0000 [drp] | Freq: Four times a day (QID) | OPHTHALMIC | Status: DC | PRN
  Filled 2016-02-11: qty 15

## 2016-02-11 MED ORDER — GLYCOPYRROLATE 0.2 MG/ML IJ SOLN
0.2000 mg | INTRAMUSCULAR | Status: DC | PRN
  Filled 2016-02-11: qty 1

## 2016-02-11 MED ORDER — MORPHINE SULFATE (PF) 2 MG/ML IV SOLN: 1.0000 mg | INTRAVENOUS | Status: AC | PRN

## 2016-02-11 MED ORDER — ACETAMINOPHEN 325 MG PO TABS: 650.0000 mg | ORAL_TABLET | Freq: Four times a day (QID) | ORAL | Status: DC | PRN

## 2016-02-11 MED ORDER — IPRATROPIUM-ALBUTEROL 0.5-2.5 (3) MG/3ML IN SOLN: 3.0000 mL | RESPIRATORY_TRACT | Status: AC | PRN

## 2016-02-11 MED ORDER — ONDANSETRON HCL 4 MG/2ML IJ SOLN: 4.0000 mg | Freq: Four times a day (QID) | INTRAMUSCULAR | Status: DC | PRN

## 2016-02-11 MED ORDER — GLYCOPYRROLATE 1 MG PO TABS
1.0000 mg | ORAL_TABLET | ORAL | Status: DC | PRN
  Filled 2016-02-11: qty 1

## 2016-02-11 MED ORDER — HALOPERIDOL LACTATE 5 MG/ML IJ SOLN: 0.5000 mg | INTRAMUSCULAR | Status: DC | PRN

## 2016-02-11 MED ORDER — BIOTENE DRY MOUTH MT LIQD: 15.0000 mL | OROMUCOSAL | Status: DC | PRN

## 2016-02-11 MED ORDER — ONDANSETRON 4 MG PO TBDP
4.0000 mg | ORAL_TABLET | Freq: Four times a day (QID) | ORAL | Status: DC | PRN
  Filled 2016-02-11: qty 1

## 2016-02-11 MED ORDER — MORPHINE SULFATE (PF) 2 MG/ML IV SOLN: 1.0000 mg | INTRAVENOUS | Status: DC | PRN

## 2016-02-11 MED ORDER — HALOPERIDOL LACTATE 2 MG/ML PO CONC
0.5000 mg | ORAL | Status: DC | PRN
  Filled 2016-02-11: qty 0.3

## 2016-02-11 MED ORDER — HALOPERIDOL 0.5 MG PO TABS
0.5000 mg | ORAL_TABLET | ORAL | Status: DC | PRN
  Filled 2016-02-11: qty 1

## 2016-02-11 MED ORDER — MORPHINE SULFATE (PF) 2 MG/ML IV SOLN
1.0000 mg | INTRAVENOUS | Status: DC | PRN
  Administered 2016-02-11 (×2): 2 mg via INTRAVENOUS
  Filled 2016-02-11 (×2): qty 1

## 2016-02-11 MED ORDER — ACETAMINOPHEN 650 MG RE SUPP: 650.0000 mg | Freq: Four times a day (QID) | RECTAL | Status: DC | PRN

## 2016-02-11 NOTE — Progress Notes (Signed)
CSW received consult for hospice placement- referral [facility]- they plan to come to the hospital today to evaluate.  CSW will continue to follow  Merlyn LotJenna Holoman, [facility]WA Clinical Social Worker [phone]

## 2016-02-11 NOTE — Progress Notes (Signed)
Daily Progress Note   Patient Name: Jared [name]       Date: 02/11/2016 DOB: Jan 15, 1941  Age: 75 y.o. MRN#: [id number] Attending Physician: Rolly SalterPranav M Patel, MD Primary Care Physician: [name],GREG, MD Admit Date: 01/24/2016  Reason for Consultation/Follow-up: Establishing goals of care  Subjective: Jared [name] is a 75 yo male with a pmh of CVA with residual right sided weakness, DM, MI, and dementia was [facility] for renal consultation as he was found to have AKI after a fall and femoral neck fracture. [facility] and [facility].  He was able to wean from the vent to high flow nasal cannula and to 5L N/C. Back on High flow oxygen now. He is at high risk for aspiration and currently has and N/G in place receiving tube feeds. He did very poorly on his modified barium study on 6/27. His kidney function is improving. He has a significant scrotal ulcer that is unlikely to heal due to poor nutrition and he is not a candidate for surgical debridment.  Length of Stay: 18  Current Medications: Scheduled Meds:  . antiseptic oral rinse  7 mL Mouth Rinse BID  . antiseptic oral rinse  7 mL Mouth Rinse q12n4p  . famotidine  20 mg Per Tube Daily  . heparin  5,000 Units Subcutaneous Q8H  . insulin aspart  0-15 Units Subcutaneous Q4H  . ipratropium-albuterol  3 mL Nebulization Q6H  . nitroGLYCERIN  0.2 mg Transdermal Daily  . Rivastigmine  13.3 mg Transdermal Daily  . sodium chloride  3 mL Nebulization Q8H    Continuous Infusions: . sodium chloride 75 mL/hr at 02/11/16 0016    PRN Meds: acetaminophen (TYLENOL) oral liquid 160 mg/5 mL, acetaminophen **OR** acetaminophen, antiseptic oral rinse, glycopyrrolate  **OR** glycopyrrolate **OR** glycopyrrolate, haloperidol **OR** haloperidol **OR** haloperidol lactate, ipratropium-albuterol, morphine injection, ondansetron **OR** ondansetron (ZOFRAN) IV, polyvinyl alcohol, RESOURCE THICKENUP CLEAR  Physical Exam          Vital Signs: BP 120/65 mmHg  Pulse 71  Temp(Src) 97.7 F (36.5 C) (Axillary)  Resp 23  Ht 5\' 10"  (1.778 m)  Wt 101.152 kg (223 lb)  BMI 32.00 kg/m2  SpO2 94% SpO2: SpO2: 94 % O2 Device: O2 Device: High Flow Nasal Cannula  O2 Flow Rate: O2 Flow Rate (L/min): 8 L/min  Skin scabbed on nasal bridge Crackles, expiratory wheezes, weakness on the left side No edema on legs  Intake/output summary:   Intake/Output Summary (Last 24 hours) at 02/11/16 1059 Last data filed at 02/11/16 0600  Gross per 24 hour  Intake 1728.75 ml  Output   1100 ml  Net 628.75 ml   LBM: Last BM Date: 02/10/16 Baseline Weight: Weight: 101.2 kg (223 lb 1.7 oz) Most recent weight: Weight: 101.152 kg (223 lb)       Palliative Assessment/Data:    Flowsheet Rows        Most Recent Value   Intake Tab    Referral Department  Hospitalist   Unit at Time of Referral  Intermediate Care Unit   Palliative Care Primary Diagnosis  Sepsis/Infectious Disease   Date Notified  02/05/16   Palliative Care Type  New Palliative care   Reason for referral  Clarify Goals of Care   Date of Admission  01/24/16   Date first seen by Palliative Care  02/05/16   # of days Palliative referral response time  0 Day(s)   # of days IP prior to Palliative referral  12   Clinical Assessment    Palliative Performance Scale Score  20%   Psychosocial & Spiritual Assessment    Palliative Care Outcomes    Patient/Family meeting held?  Yes   Who was at the meeting?  spoke on the phone with son, Wandra FeinsteinHCPOA, Tracy   Palliative Care Outcomes  Changed to focus on comfort      Patient Active Problem List   Diagnosis Date Noted  . Dyspnea   . Hospice care   . Goals of care,  counseling/discussion   . Encounter for hospice care discussion   . Acute respiratory failure with hypoxemia (HCC)   . DNR (do not resuscitate)   . Palliative care encounter   . Dysphagia   . Uncontrolled diabetes mellitus with hyperglycemia, with long-term current use of insulin (HCC) 01/28/2016  . Hypernatremia 01/28/2016  . Acute kidney injury (HCC) 01/28/2016  . Acute respiratory failure with hypoxia (HCC) 01/28/2016  . HCAP (healthcare-associated pneumonia) 01/28/2016  . 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 Acute respiratory failure (HCC) 01/24/2016  . Diabetes mellitus 03/25/2011  . Hearing loss 03/25/2011  . GERD (gastroesophageal reflux disease) 03/25/2011  . High blood pressure 03/25/2011  . Anxiety 03/25/2011    Palliative Care Assessment & Plan    Assessment: Back on high flow oxygen overnight. Spoke with French Anaracy and his wife, they are wanting comfort care [place]. No PEG tube placement per family.  PPS of 20%  Recommendations/Plan:  Changed medication from oral to IV or transdermal, focused on comfort  Removed N/G tube and hand mittens today.  PRN Comfort feeds with aspiration precautions.  Adjusted meds away from oral to IV and transdermal only.  Will give morphine with the goal to remove high flow oxygen and maintain on nasal cannula  Expect 2-3 weeks of life due to worsening respiratory function and aspiration.   Transfer to residential Hospice  Goals of Care and Additional Recommendations:  Limitations on Scope of Treatment: Full Comfort Care  Code Status: DNR  Prognosis:   < 2 weeks Given poor respiratory status, bed bound, minimal PO intake, recurrent aspiration.  Discharge Planning:  Hospice facility  Care plan was discussed with French Anaracy and Wife over the phone, and [facility] attending.  Thank you for allowing the Palliative  Medicine Team to assist in the care of this patient.   Time In: 0930 Time Out: 0950 Total Time 20 mins Prolonged  Time Billed  no       Greater than 50%  of this time was spent counseling and coordinating care related to the above assessment and plan.  Debbra Riding, PA-S [name], [place] Palliative Medicine Pager: [phone]  Please contact Palliative Medicine Team phone at [phone] for questions and concerns.

## 2016-02-11 NOTE — Care Management Important Message (Signed)
Important Message  Patient Details  Name: Marrian SalvageRonald Beale MRN: [id number] Date of Birth: 09/30/1940   Medicare Important Message Given:  Yes    Bernadette HoitShoffner, Glendoris Nodarse Coleman 02/11/2016, 9:52 AM

## 2016-02-11 NOTE — Progress Notes (Signed)
   02/11/16 1400  Clinical Encounter Type  Visited With Patient and family together  Visit Type Follow-up  Referral From Care management;Palliative care team  Spiritual Encounters  Spiritual Needs Emotional  Stress Factors  Patient Stress Factors Loss of control  [facility] and spoke with patient and family. Both patient and family indicated they were not in need of support at this time. CHP let them know if a need arose to contact the nurse and we would be available. Rodney BoozeGail L Moe Brier 02/11/2016

## 2016-02-11 NOTE — Progress Notes (Signed)
Patient continues to have low SPO2 in the high 80's .  Place patient back on the high flow [place] 6lpm.

## 2016-02-11 NOTE — Progress Notes (Signed)
Found pt on 6L [place]

## 2016-02-11 NOTE — Discharge Planning (Signed)
[facility] Discharge Summary   Patient: Jared [name] ZOX:[id number]   PCP: Feliciana Rossetti, MD DOB: 10/24/1940   Date of admission: 01/24/2016   Date of discharge:  02/11/2016    Discharge Diagnoses:  Principal Problem:   Acute respiratory failure with hypoxia (HCC) Active Problems:   Uncontrolled diabetes mellitus with hyperglycemia, with long-term current use of insulin (HCC)   Hypernatremia   Acute kidney injury (HCC)   HCAP (healthcare-associated pneumonia)   Vascular dementia   DNR (do not resuscitate)   Palliative care encounter   Dysphagia   Acute respiratory failure with hypoxemia (HCC)   Goals of care, counseling/discussion   Encounter for hospice care discussion   Dyspnea   Hospice care   Terminal care  Admitted From: home>>01/21/2012 [facility]>>01/24/2016 transfer to [facility] Disposition:  In patient hospice  Recommendations for Outpatient Follow-up:  1. Please adjust medications as needed for comfort.   Diet recommendation: may have comfort feed  Activity: The patient is advised to rest.  Discharge Condition: poor  Code Status: DNR DNI/ comfort care, transition to inpatient hospice  History of present illness: As per the H and P dictated on admission, "75yo male with hx dementia, DM, previous CVA with residual L sided weakness initially admitted to [facility] 6/7 after a fall at home with L femoral neck fx which was surgically repaired. Post op patient was unable to wean from vent. Pt had progressive renal failure, volume overload and was tx [facility] for ongoing care and renal eval. "  Hospital Course:  Pt. with PMH of CVA with residual left-sided weakness, dementia, diabetes mellitus; admitted on 01/24/2016, with complaint of ventilator dependent respiratory failure, patient was initially admitted in [facility] on 01/21/2016 after a fall at home with the left humeral neck fracture. Pt underwent surgical repair of the hip at  [facility]. Postoperatively the patient was unable to be extubated and had progressive renal failure with volume overload and therefore was transferred to [facility] on 01/24/2016. Patient received IV antibiotics and with improvement in oxygenation and he was extubated on 01/26/2016. Patient was transferred to hospitalist service and completed antibiotic course on 01/30/2016. 01/31/2016 the patient become febrile with worsening oxygenation with acute hypoxic respiratory failure and was transferred to step down unit on 02/01/2016 with BiPAP support. Pt has been requiring high flow nasal cannula despite aggressive treatment with chest PT vest, antibiotics. Pt has been failing speech swallow evaluation as well without any improvement in mentation. After a family meeting it was decided that the pt's care will be transitioned to comfort care with transfer to in patient hospice facility to continue symptoms management.  Summary of his active problems in the hospital is as following. 1. Acute respiratory failure with hypoxia (HCC) Sepsis with healthcare associated pneumonia. Patient completed 7 days of IV antibiotics, initially was able to be extubated. With worsening respiratory status transferred back to step down unit. Treated with BIPAP and chest PT vest. Supported with high flow nasal canula, after able to remain on nasal cannula for 1 day pt went back to high flow nasal cannula on 02/11/2016. Second time pt was on aztreonam and later on switched to Primaxin. Last day of antibiotic 02/10/2016. MRSA negative, blood cultures negative, urine culture negative.  2. Acute kidney injury. Hypernatremia. Appreciate nephrology consultation. Probably hemodynamically mediated with hypotension as well as enalapril. Worsen with postoperative hypovolemia. Renal function improving, sodium level gradually getting better.   3. Diabetes mellitus with hyperglycemia. Patient currently  nothing by  mouth with concern of aspiration.  4. Dysphagia. Protein calorie malnutrition in the context of critical illness. Per request of family tube feeding was initiated with nasogastric tube. Tube feeding change from Glucerna to vital AF Failed MBS and at that point after a family discussion with palliative care it was decide by family to transition to complete comfort with in patient hospice.  5. Left femoral neck fracture. Surgically repaired on 01/21/2016.  6. History of CVA with right-sided weakness. On aggrenox.   7. Vascular dementia. Continue patch. Also holding Seroquel for depression.  8. Scrotal ulcer. From severe edema. Also worsening from diarrhea. Use air mattress.  Pt was seen by palliative care, who recommended in patient hospice,which was arranged by Child psychotherapist and case Production designer, theatre/television/film.  Procedures and Results:  Extubation  Echocardiogram  Study Conclusions  - Left ventricle: Overall EF preserved bout apical  aneurysm/ballooning No obvious mural apical thrombus. The cavity  size was normal. There was mild focal basal hypertrophy of the  septum. The estimated ejection fraction was 55%. Wall motion was  normal; there were no regional wall motion abnormalities. - Left atrium: The atrium was mildly dilated. - Atrial septum: No defect or patent foramen ovale was identified  Consultations:  PCCM primary admission  Nephrology  Palliative care.  DISCHARGE MEDICATION: Current Discharge Medication List    START taking these medications   Details  ipratropium-albuterol (DUONEB) 0.5-2.5 (3) MG/3ML SOLN Take 3 mLs by nebulization every 4 (four) hours as needed. Qty: 360 mL    morphine 2 MG/ML injection Inject 0.5-1 mLs (1-2 mg total) into the vein every 15 (fifteen) minutes as needed (Dyspnea). Qty: 1 mL, Refills: 0      CONTINUE these medications which have NOT CHANGED   Details  pantoprazole (PROTONIX) 40 MG tablet Take 40 mg by mouth daily.    Polyvinyl  Alcohol-Povidone (REFRESH OP) Place 1 drop into both eyes 3 (three) times daily.    QUEtiapine (SEROQUEL) 25 MG tablet Take 25 mg by mouth daily at 12 noon.    Rivastigmine 13.3 MG/24HR PT24 Take 13.3 mg by mouth daily. Exelon      STOP taking these medications     citalopram (CELEXA) 40 MG tablet      dipyridamole-aspirin (AGGRENOX) 200-25 MG 12hr capsule      enalapril (VASOTEC) 20 MG tablet      furosemide (LASIX) 20 MG tablet      glimepiride (AMARYL) 2 MG tablet      insulin glargine (LANTUS) 100 UNIT/ML injection      isosorbide mononitrate (IMDUR) 30 MG 24 hr tablet      memantine (NAMENDA XR) 28 MG CP24 24 hr capsule      metFORMIN (GLUCOPHAGE) 500 MG tablet      metoprolol tartrate (LOPRESSOR) 25 MG tablet      niacin (SLO-NIACIN) 500 MG tablet      pravastatin (PRAVACHOL) 40 MG tablet      tamsulosin (FLOMAX) 0.4 MG CAPS capsule        Allergies  Allergen Reactions  . Penicillins Hives    Early childhood reaction - no other information available   Discharge Instructions    May have comfort feeds from floor stock    Complete by:  As directed           Discharge Exam: Filed Weights   02/09/16 0800 02/10/16 0459 02/11/16 0500  Weight: 99.791 kg (220 lb) 100.699 kg (222 lb) 101.152 kg (223 lb)  Filed Vitals:   02/11/16 0748 02/11/16 1139  BP: 120/65 127/67  Pulse: 71 73  Temp: 97.7 F (36.5 C) 97.9 F (36.6 C)  Resp: 23 21   General: Appear in moderate distress, no Rash, scrotal ulcer; Oral Mucosa dry. Cardiovascular: S1 and S2 Present, aortic systolic Murmur, difficult to assess JVD Respiratory: Bilateral Air entry present and bilateral Crackles, Occasional wheezes Abdomen: Bowel Sound present, Soft and no tenderness Extremities: no Pedal edema, no calf tenderness Neurology: confused and lethargic  The results of significant diagnostics from this hospitalization (including imaging, microbiology, ancillary and laboratory) are listed below  for reference.    Significant Diagnostic Studies: US Renal  02/03/2016  CLINICAL DATA:  Acute renal insufficiency; history of diabetes and hypertension. EXAM: RENAL / URINARY TRACT ULTRASOUND COMPLETE COMPARISON:  None in PACs FINDINGS: Right Kidney: Length: 13.1 cm. The renal cortical echotexture is increased as compared to the liver. There is no focal mass. There is no hydronephrosis. Left Kidney: Length: 12.9 cm. The renal cortical echotexture is mildly increased similar to that on the right. There is no hydronephrosis. Bladder: The partially distended urinary bladder is normal. IMPRESSION: Increased renal cortical echotexture consistent with medical renal disease. There is no hydronephrosis. Electronically Signed   By: David  [place] M.D.   On: 02/03/2016 12:01   Dg Chest Port 1 View  02/08/2016  CLINICAL DATA:  Follow-up pneumonia, stroke EXAM: PORTABLE CHEST 1 VIEW COMPARISON:  02/05/2016 FINDINGS: Cardiomegaly again noted. There is NG feeding tube with tip in proximal duodenum. No pulmonary edema. Persistent bilateral basilar streaky atelectasis or infiltrate. IMPRESSION: Persistent bilateral basilar streaky atelectasis or infiltrate. NG feeding tube in place. Electronically Signed   By: Natasha Mead M.D.   On: 02/08/2016 09:11   Dg Chest Port 1 View  02/05/2016  CLINICAL DATA:  Acute respiratory failure EXAM: PORTABLE CHEST 1 VIEW COMPARISON:  02/04/2016 FINDINGS: Improved lung volumes. Persistent bibasilar airspace disease similar to yesterday. No pleural effusion. Pulmonary vascularity appears normal. IMPRESSION: Improved lung volume with persistent bibasilar atelectasis/pneumonia. Electronically Signed   By: Marlan Palau M.D.   On: 02/05/2016 07:04   Dg Chest Port 1 View  02/04/2016  CLINICAL DATA:  Congestive heart failure. EXAM: PORTABLE CHEST 1 VIEW COMPARISON:  Radiograph of February 02, 2016. FINDINGS: Stable cardiomegaly. No pneumothorax is noted. No significant pleural effusion is noted.  Stable bibasilar opacities are noted concerning for subsegmental atelectasis or possibly infiltrates. Bony thorax is unremarkable. IMPRESSION: Stable bibasilar opacities are noted concerning for subsegmental atelectasis or possibly infiltrates. Electronically Signed   By: Lupita Raider, M.D.   On: 02/04/2016 10:19   Dg Chest Port 1 View  02/02/2016  CLINICAL DATA:  75 year old male with respiratory distress and chest congestion. Initial encounter. EXAM: PORTABLE CHEST 1 VIEW COMPARISON:  02/01/2016 and earlier. FINDINGS: Portable AP semi upright view at 1318 hours. The patient is mildly rotated to the right. Mediastinal contours are stable and within normal limits. No pneumothorax. No pulmonary edema or definite effusion. Dense retrocardiac opacity persists with suggestion of air bronchograms, and is mostly obscuring the left hemidiaphragm. This appears stable since 01/31/2016, and improved since 01/26/2016. IMPRESSION: Continued left lower lobe consolidation compatible with pneumonia, stable since 01/31/2016. No definite pleural effusion. Electronically Signed   By: Odessa Fleming M.D.   On: 02/02/2016 12:50   Dg Chest Port 1 View  02/01/2016  CLINICAL DATA:  Patient with history of pneumonia. Respiratory distress. EXAM: PORTABLE CHEST 1 VIEW COMPARISON:  Chest radiograph 01/31/2016.  FINDINGS: Monitoring leads overlie the patient. Stable cardiomegaly. Unchanged mid lower lung perihilar interstitial pulmonary opacities. More focal consolidation left lower lung. Small left pleural effusion. No definite pneumothorax. IMPRESSION: Mid and lower lung perihilar interstitial opacities may represent pulmonary edema in the appropriate clinical setting. More focal consolidation left lower lung may represent atelectasis or infection. Small left pleural effusion. Electronically Signed   By: Annia Beltrew  Davis M.D.   On: 02/01/2016 14:57   Dg Chest Port 1 View  01/31/2016  CLINICAL DATA:  Fever.  Shortness of breath. EXAM: PORTABLE  CHEST 1 VIEW COMPARISON:  01/26/2016 chest radiograph. FINDINGS: Stable cardiomediastinal silhouette with mild cardiomegaly. No pneumothorax. Stable small left pleural effusion. Stable patchy opacities at the left greater than right lung bases. Stable diffuse linear parahilar interstitial opacities suggesting mild pulmonary edema. IMPRESSION: 1. Stable mild congestive heart failure and small left pleural effusion . 2. Stable patchy bibasilar lung opacities, left greater than right, favor atelectasis, cannot exclude a component of pneumonia or aspiration. Electronically Signed   By: Delbert PhenixJason A Poff M.D.   On: 01/31/2016 11:55   Dg Chest Port 1 View  01/26/2016  CLINICAL DATA:  Respiratory failure. EXAM: PORTABLE CHEST 1 VIEW COMPARISON:  01/25/2016. FINDINGS: Endotracheal and NG tube in stable position. Cardiomegaly with pulmonary vascular congestion and interstitial prominence along the left side pleural effusion no suggesting congestive heart failure. Persistent infiltrate next right mid lung again noted. Continued follow-up exams again suggested to demonstrate clearing. No pneumothorax. IMPRESSION: 1. Lines and tubes in stable position. 2. Persistent infiltrate right mid lung again noted. Continued follow-up chest x-rays to demonstrate clearing suggested. 3. Cardiomegaly with new onset pulmonary venous congestion and bilateral interstitial prominence along with left-sided pleural effusion. Findings consistent with congestive heart failure. Electronically Signed   By: Maisie Fushomas  Register   On: 01/26/2016 06:52   Dg Chest Port 1 View  01/25/2016  CLINICAL DATA:  Fever. EXAM: PORTABLE CHEST 1 VIEW COMPARISON:  January 24, 2016 FINDINGS: Stable support apparatus. No pneumothorax. The focal opacity in the medial right lung base has resolved. Minimal opacity in the bases may simply represent atelectasis. No other changes. IMPRESSION: Resolution of the focal opacity in the medial right lung base. Minimal residual opacity in  the bases, likely atelectasis. Electronically Signed   By: Gerome Samavid  Williams III M.D   On: 01/25/2016 08:31   Dg Chest Port 1 View  01/24/2016  CLINICAL DATA:  Respiratory failure. EXAM: PORTABLE CHEST 1 VIEW COMPARISON:  None. FINDINGS: Endotracheal tube in satisfactory position. Nasogastric tube extending into the stomach. Mildly enlarged cardiac silhouette. Patchy opacity in the right mid to lower lung zone, medially. There is also airspace opacity in the left lower lobe. Possible small left pleural effusion. Diffuse osteopenia. IMPRESSION: 1. Probable pneumonia in the right mid to lower lung zone, medially. A mass cannot be excluded and this will require follow-up. 2. Left lower lobe atelectasis or pneumonia. 3. Possible small left pleural effusion. 4. Mild cardiomegaly. Electronically Signed   By: Beckie SaltsSteven  Reid M.D.   On: 01/24/2016 17:08   Dg Abd Portable 1v  02/06/2016  CLINICAL DATA:  Feeding tube placement EXAM: PORTABLE ABDOMEN - 1 VIEW COMPARISON:  Portable exam 1605 hours compared to 1339 hours FINDINGS: Tip of feeding tube projects over approximately the junction of the second and third portions of the duodenum. Bowel gas pattern normal. Bones demineralized. Surgical clips RIGHT upper quadrant question cholecystectomy. IMPRESSION: Tip of feeding tube projects over approximately the junction of the second and  third portions of the duodenum. Electronically Signed   By: Ulyses Southward M.D.   On: 02/06/2016 16:12   Dg Abd Portable 1v  02/06/2016  CLINICAL DATA:  Nasogastric tube placement. EXAM: PORTABLE ABDOMEN - 1 VIEW COMPARISON:  Chest radiograph of 1 day prior FINDINGS: Feeding tube terminates at the gastric antrum. Cholecystectomy. Bibasilar airspace disease with right hemidiaphragm elevation. IMPRESSION: Feeding tube terminating at the gastric antrum. Electronically Signed   By: Jeronimo Greaves M.D.   On: 02/06/2016 13:48   Dg Swallowing Func-speech Pathology  02/10/2016  Objective Swallowing  Evaluation: Type of Study: MBS-Modified Barium Swallow Study Patient Details Name: Quintavis Brands MRN: [id number] Date of Birth: [birth year] Today's Date: 02/10/2016 Time: SLP Start Time (ACUTE ONLY): 1350-SLP Stop Time (ACUTE ONLY): 1420 SLP Time Calculation (min) (ACUTE ONLY): 30 min Past Medical History: Past Medical History Diagnosis Date . High blood pressure  . GERD (gastroesophageal reflux disease)  . Bruises easily  . Anxiety  . Hearing loss  . Diabetes mellitus  . Stroke (HCC)  . Heart attack [facility])  Past Surgical History: Past Surgical History Procedure Laterality Date . Gallbladder surgery   HPI: 75 yo male with hx dementia, DM, previous CVA with residual L sided weakness initially admitted to [facility] 6/7 after a fall at home with L femoral neck fx which was surgically repaired. Post op patient was unable to wean from vent. Pt had progressive renal failure, volume overload and was tx to [facility] 6/10 for ongoing care and renal eval. Intubated 6/10-6/12.  Swallow evaluation 6/13 with recs for dysphagia 1, thin liquids and anticipation of D/C on same diet. On 6/17 patient became febrile with possible aspiration pneumonia and on 6/18 became septic and went into acute hypoxic respiratory failure requiting NRB and transfer to stepdown unit. New orders for swallow evaluation 6/20 given concerns for aspiration.  Subjective: confused - at baseline per family Assessment / Plan / Recommendation CHL IP CLINICAL IMPRESSIONS 02/10/2016 Therapy Diagnosis Moderate pharyngeal phase dysphagia;Moderate oral phase dysphagia Clinical Impression Pt presents with a mod-severe generalized dysphagia with poor oral control and propulsion; delayed swallow with sluggish mobility of hyolaryngeal musculature; significant pharyngeal retention post-swallow; aspiration of nectar-thick liquids upon the swallow and frank penetration with trace aspiration of thicker residuals after the swallow.  Aspiration did not consistently elicit a cough  response. Pt had difficulty following commands for head postures/strategies to improve airway protection.  He was less alert this afternoon than during earlier session, but study reflects performance during episodes of fatigue.  Recommend NPO with trial POs with SLP pending GOC meeting. Impact on safety and function Severe aspiration risk   CHL IP TREATMENT RECOMMENDATION 02/03/2016 Treatment Recommendations Therapy as outlined in treatment plan below   Prognosis 02/03/2016 Prognosis for Safe Diet Advancement Fair Barriers to Reach Goals Cognitive deficits Barriers/Prognosis Comment -- CHL IP DIET RECOMMENDATION 02/10/2016 SLP Diet Recommendations NPO Liquid Administration via -- Medication Administration -- Compensations -- Postural Changes --   CHL IP OTHER RECOMMENDATIONS 02/10/2016 Recommended Consults -- Oral Care Recommendations Oral care QID Other Recommendations --   CHL IP FOLLOW UP RECOMMENDATIONS 02/10/2016 Follow up Recommendations Skilled Nursing facility   [facility] IP FREQUENCY AND DURATION 02/03/2016 Speech Therapy Frequency (ACUTE ONLY) min 2x/week Treatment Duration 1 week      CHL IP ORAL PHASE 02/10/2016 Oral Phase Impaired Oral - honey Teaspoon -- Oral - Pudding Cup -- Oral - honeyTeaspoon Incomplete tongue to palate contact;Reduced posterior propulsion;Delayed oral transit;Weak lingual manipulation Oral - Honey Cup --  Oral - Nectar Teaspoon Incomplete tongue to palate contact;Reduced posterior propulsion;Delayed oral transit;Weak lingual manipulation Oral - Nectar Cup -- Oral - Nectar Straw -- Oral - Thin Teaspoon -- Oral - Thin Cup -- Oral - Thin Straw -- Oral - Puree Incomplete tongue to palate contact;Reduced posterior propulsion;Delayed oral transit;Weak lingual manipulation Oral - Mech Soft -- Oral - Regular -- Oral - Multi-Consistency -- Oral - Pill -- Oral Phase - Comment --  CHL IP PHARYNGEAL PHASE 02/10/2016 Pharyngeal Phase Impaired Pharyngeal- Pudding Teaspoon -- Pharyngeal -- Pharyngeal- Pudding  Cup -- Pharyngeal -- Pharyngeal- Honey Teaspoon -- Pharyngeal -- Pharyngeal- Honey Cup -- Pharyngeal -- Pharyngeal- Nectar Teaspoon -- Pharyngeal -- Pharyngeal- Nectar Cup -- Pharyngeal -- Pharyngeal- Nectar Straw -- Pharyngeal -- Pharyngeal- Thin Teaspoon -- Pharyngeal -- Pharyngeal- Thin Cup -- Pharyngeal -- Pharyngeal- Thin Straw -- Pharyngeal -- Pharyngeal- Puree -- Pharyngeal -- Pharyngeal- Mechanical Soft -- Pharyngeal -- Pharyngeal- Regular -- Pharyngeal -- Pharyngeal- Multi-consistency -- Pharyngeal -- Pharyngeal- Pill -- Pharyngeal -- Pharyngeal Comment --  CHL IP CERVICAL ESOPHAGEAL PHASE 02/10/2016 Cervical Esophageal Phase (No Data) Pudding Teaspoon -- Pudding Cup -- Honey Teaspoon -- Honey Cup -- Nectar Teaspoon -- Nectar Cup -- Nectar Straw -- Thin Teaspoon -- Thin Cup -- Thin Straw -- Puree -- Mechanical Soft -- Regular -- Multi-consistency -- Pill -- Cervical Esophageal Comment -- No flowsheet data found. Blenda MountsCouture, Amanda Laurice 02/10/2016, 2:53 PM              Microbiology: No results found for this or any previous visit (from the past 240 hour(s)).   Labs: CBC:  Recent Labs Lab 02/05/16 0244 02/06/16 0340 02/07/16 0427 02/09/16 0344 02/10/16 0303  WBC 12.3* 13.4* 11.7* 9.3 8.9  NEUTROABS 10.1*  --  9.6*  --   --   HGB 9.5* 9.6* 9.6* 8.6* 9.1*  HCT 31.1* 32.4* 31.5* 29.6* 31.2*  MCV 94.8 97.0 100.0 98.0 98.4  PLT 187 225 236 243 252   Basic Metabolic Panel:  Recent Labs Lab 02/05/16 0244  02/07/16 0423 02/07/16 0427 02/08/16 0212 02/09/16 0344 02/10/16 0303 02/10/16 2315 02/11/16 0500  NA 154*  < >  --  158* 156* 151* 151*  --  149*  K 3.6  < >  --  3.7 3.6 4.0 4.6 5.0 4.7  CL 122*  < >  --  >130* 130* 124* 122*  --  122*  CO2 22  < >  --  23 23 23 25   --  23  GLUCOSE 188*  < >  --  225* 208* 238* 195*  --  145*  BUN 47*  < >  --  41* 38* 34* 30*  --  27*  CREATININE 3.00*  < >  --  2.20* 1.90* 1.59* 1.45*  --  1.27*  CALCIUM 7.7*  < >  --  7.7* 7.5* 7.3* 7.7*   --  7.8*  MG 2.4  --   --  2.6* 2.7*  --   --  2.7* 2.6*  PHOS 3.7  < > 3.1  --  3.6 3.3 3.6  --  4.1  < > = values in this interval not displayed. Liver Function Tests:  Recent Labs Lab 02/06/16 0345 02/08/16 0212 02/09/16 0344 02/10/16 0303 02/11/16 0500  ALBUMIN 1.6* 1.5* 1.5* 1.5* 1.6*   No results for input(s): LIPASE, AMYLASE in the last 168 hours. No results for input(s): AMMONIA in the last 168 hours. Cardiac Enzymes: No results for input(s): CKTOTAL, CKMB, CKMBINDEX, TROPONINI  in the last 168 hours. BNP (last 3 results)  Recent Labs  01/24/16 1633  BNP 243.6*   CBG:  Recent Labs Lab 02/10/16 1559 02/10/16 1912 02/10/16 2259 02/11/16 0312 02/11/16 0751  GLUCAP 206* 115* 101* 150* 137*   Time spent: 30 minutes  Signed:  Inge Waldroup  [facility]  02/11/2016  , 11:45 AM

## 2016-02-11 NOTE — Progress Notes (Signed)
Spoke with son Jared [name] today.  Patient's wife/son/daughter have discussed his situation and opted against a feeding tube.  They feel he would be very unhappy back at SNF.  They would like for him to be discharged to [facility].    Patient requiring [place] again last night.  Will add morphine PRN for dyspnea/comfort/air hunger and try to move him back to regular nasal cannula.  Social work order placed for residential hospice.  Algis DownsMarianne York, [place] JerseyPA-C Palliative Medicine Pager: [phone]

## 2016-02-11 NOTE — Progress Notes (Signed)
[facility] Progress Note    Assessment/ Plan:   1 AKI- continues to improve, prob hemodynamically mediated with hx of a fall and enalapril on board, exacerbated by hypovolemia. 2 Hypernatremia - good trend and stable with a SNa of 151 --> 149 - D5W discontinued (also hyperglcycemic). - Free water increased to 300ml q4hr VIA PEG --> but pt no longer has an NGT present for free water administration. - Continue 1/2NS for now. -  Will again request Urine Na + K calculate free water excretion as the 1/2NS may be inadequate. 3 Prob asp PNA 4 Dementia?  Subjective:   Responsive but not appropriate. Patient confused but pleasant   Objective:   BP 120/65 mmHg  Pulse 71  Temp(Src) 97.7 F (36.5 C) (Axillary)  Resp 23  Ht 5\' 10"  (1.778 m)  Wt 101.152 kg (223 lb)  BMI 32.00 kg/m2  SpO2 94%  Intake/Output Summary (Last 24 hours) at 02/11/16 1050 Last data filed at 02/11/16 0600  Gross per 24 hour  Intake 1728.75 ml  Output   1100 ml  Net 628.75 ml   Weight change: 1.361 kg (3 lb)  Physical Exam: General appearance: Lethargic but cooperative Resp: Minimal rales left base, right clear Cardio: regular rate and rhythm, S1, S2 normal, no murmur, click, rub or gallop Extremities: extremities normal, atraumatic, no cyanosis or edema, 2+ RA pulse on the right Abd: hyperactive BS, soft  Imaging: Dg Swallowing Func-speech Pathology  02/10/2016  Objective Swallowing Evaluation: Type of Study: MBS-Modified Barium Swallow Study Patient Details Name: Jared [name] MRN: [id number] Date of Birth: 07/25/1941 Today's Date: 02/10/2016 Time: SLP Start Time (ACUTE ONLY): 1350-SLP Stop Time (ACUTE ONLY): 1420 SLP Time Calculation (min) (ACUTE ONLY): 30 min Past Medical History: Past Medical History Diagnosis Date . High blood pressure  . GERD (gastroesophageal reflux disease)  . Bruises easily  . Anxiety  . Hearing loss  . Diabetes mellitus  . Stroke (HCC)  . Heart attack [facility](HCC)  Past  Surgical History: Past Surgical History Procedure Laterality Date . Gallbladder surgery   HPI: 75 yo male with hx dementia, DM, previous CVA with residual L sided weakness [facility] after a fall at home with L femoral neck fx which was surgically repaired. Post op patient was unable to wean from vent. Pt had progressive renal failure, volume overload and was tx to [facility] 6/10 for ongoing care and renal eval. Intubated 6/10-6/12.  Swallow evaluation 6/13 with recs for dysphagia 1, thin liquids and anticipation of D/C on same diet. On 6/17 patient became febrile with possible aspiration pneumonia and on 6/18 became septic and went into acute hypoxic respiratory failure requiting NRB and transfer to stepdown unit. New orders for swallow evaluation 6/20 given concerns for aspiration.  Subjective: confused - at baseline per family Assessment / Plan / Recommendation [facility]L IP CLINICAL IMPRESSIONS 02/10/2016 Therapy Diagnosis Moderate pharyngeal phase dysphagia;Moderate oral phase dysphagia Clinical Impression Pt presents with a mod-severe generalized dysphagia with poor oral control and propulsion; delayed swallow with sluggish mobility of hyolaryngeal musculature; significant pharyngeal retention post-swallow; aspiration of nectar-thick liquids upon the swallow and frank penetration with trace aspiration of thicker residuals after the swallow.  Aspiration did not consistently elicit a cough response. Pt had difficulty following commands for head postures/strategies to improve airway protection.  He was less alert this afternoon than during earlier session, but study reflects performance during episodes of fatigue.  Recommend NPO with trial POs with SLP pending GOC meeting.  Impact on safety and function Severe aspiration risk   [facility] IP TREATMENT RECOMMENDATION 02/03/2016 Treatment Recommendations Therapy as outlined in treatment plan below   Prognosis 02/03/2016 Prognosis for Safe Diet  Advancement Fair Barriers to Reach Goals Cognitive deficits Barriers/Prognosis Comment -- [facility] IP DIET RECOMMENDATION 02/10/2016 SLP Diet Recommendations NPO Liquid Administration via -- Medication Administration -- Compensations -- Postural Changes --   [facility] IP OTHER RECOMMENDATIONS 02/10/2016 Recommended Consults -- Oral Care Recommendations Oral care QID Other Recommendations --   [facility] IP FOLLOW UP RECOMMENDATIONS 02/10/2016 Follow up Recommendations Skilled Nursing facility   [facility] IP FREQUENCY AND DURATION 02/03/2016 Speech Therapy Frequency (ACUTE ONLY) min 2x/week Treatment Duration 1 week      CHL IP ORAL PHASE 02/10/2016 Oral Phase Impaired Oral - honey Teaspoon -- Oral - Pudding Cup -- Oral - honeyTeaspoon Incomplete tongue to palate contact;Reduced posterior propulsion;Delayed oral transit;Weak lingual manipulation Oral - Honey Cup -- Oral - Nectar Teaspoon Incomplete tongue to palate contact;Reduced posterior propulsion;Delayed oral transit;Weak lingual manipulation Oral - Nectar Cup -- Oral - Nectar Straw -- Oral - Thin Teaspoon -- Oral - Thin Cup -- Oral - Thin Straw -- Oral - Puree Incomplete tongue to palate contact;Reduced posterior propulsion;Delayed oral transit;Weak lingual manipulation Oral - Mech Soft -- Oral - Regular -- Oral - Multi-Consistency -- Oral - Pill -- Oral Phase - Comment --  CHL IP PHARYNGEAL PHASE 02/10/2016 Pharyngeal Phase Impaired Pharyngeal- Pudding Teaspoon -- Pharyngeal -- Pharyngeal- Pudding Cup -- Pharyngeal -- Pharyngeal- Honey Teaspoon -- Pharyngeal -- Pharyngeal- Honey Cup -- Pharyngeal -- Pharyngeal- Nectar Teaspoon -- Pharyngeal -- Pharyngeal- Nectar Cup -- Pharyngeal -- Pharyngeal- Nectar Straw -- Pharyngeal -- Pharyngeal- Thin Teaspoon -- Pharyngeal -- Pharyngeal- Thin Cup -- Pharyngeal -- Pharyngeal- Thin Straw -- Pharyngeal -- Pharyngeal- Puree -- Pharyngeal -- Pharyngeal- Mechanical Soft -- Pharyngeal -- Pharyngeal- Regular -- Pharyngeal -- Pharyngeal- Multi-consistency --  Pharyngeal -- Pharyngeal- Pill -- Pharyngeal -- Pharyngeal Comment --  CHL IP CERVICAL ESOPHAGEAL PHASE 02/10/2016 Cervical Esophageal Phase (No Data) Pudding Teaspoon -- Pudding Cup -- Honey Teaspoon -- Honey Cup -- Nectar Teaspoon -- Nectar Cup -- Nectar Straw -- Thin Teaspoon -- Thin Cup -- Thin Straw -- Puree -- Mechanical Soft -- Regular -- Multi-consistency -- Pill -- Cervical Esophageal Comment -- No flowsheet data found. Blenda Mounts Laurice 02/10/2016, 2:53 PM               Labs: BMET  Recent Labs Lab 02/05/16 0244 02/06/16 0345 02/06/16 1905 02/07/16 0423 02/07/16 0427 02/08/16 0212 02/09/16 0344 02/10/16 0303 02/10/16 2315 02/11/16 0500  NA 154* 159* 161*  --  158* 156* 151* 151*  --  149*  K 3.6 3.3* 3.7  --  3.7 3.6 4.0 4.6 5.0 4.7  CL 122* 130* >130*  --  >130* 130* 124* 122*  --  122*  CO2 --  --  23  GLUCOSE 188* 142* 161*  --  225* 208* 238* 195*  --  145*  BUN 47* 41* 42*  --  41* 38* 34* 30*  --  27*  CREATININE 3.00* 2.62* 2.35*  --  2.20* 1.90* 1.59* 1.45*  --  1.27*  CALCIUM 7.7* 7.9* 7.8*  --  7.7* 7.5* 7.3* 7.7*  --  7.8*  PHOS 3.7 3.3  --  3.1  --  3.6 3.3 3.6  --  4.1   CBC  Recent Labs Lab 02/05/16 0244 02/06/16 0340 02/07/16 0427 02/09/16 0344  02/10/16 0303  WBC 12.3* 13.4* 11.7* 9.3 8.9  NEUTROABS 10.1*  --  9.6*  --   --   HGB 9.5* 9.6* 9.6* 8.6* 9.1*  HCT 31.1* 32.4* 31.5* 29.6* 31.2*  MCV 94.8 97.0 100.0 98.0 98.4  PLT 187 225 236 243 252    Medications:    . antiseptic oral rinse  7 mL Mouth Rinse BID  . antiseptic oral rinse  7 mL Mouth Rinse q12n4p  . famotidine  20 mg Per Tube Daily  . heparin  5,000 Units Subcutaneous Q8H  . insulin aspart  0-15 Units Subcutaneous Q4H  . ipratropium-albuterol  3 mL Nebulization Q6H  . nitroGLYCERIN  0.2 mg Transdermal Daily  . Rivastigmine  13.3 mg Transdermal Daily  . sodium chloride  3 mL Nebulization Q8H      Paulene FloorJames Aloysius Heinle, MD 02/11/2016, 10:50 AM

## 2016-02-18 NOTE — Discharge Summary (Signed)
[facility] Discharge Summary   Patient: Jared [name] Treat ZOX:[id number]   PCP: Feliciana Rossetti, MD DOB: 10/24/1940   Date of admission: 01/24/2016   Date of discharge:  02/11/2016    Discharge Diagnoses:  Principal Problem:   Acute respiratory failure with hypoxia (HCC) Active Problems:   Uncontrolled diabetes mellitus with hyperglycemia, with long-term current use of insulin (HCC)   Hypernatremia   Acute kidney injury (HCC)   HCAP (healthcare-associated pneumonia)   Vascular dementia   DNR (do not resuscitate)   Palliative care encounter   Dysphagia   Acute respiratory failure with hypoxemia (HCC)   Goals of care, counseling/discussion   Encounter for hospice care discussion   Dyspnea   Hospice care   Terminal care  Admitted From: home>>01/21/2012 [place] [facility]>>01/24/2016 transfer to [facility] Disposition:  In patient hospice  Recommendations for Outpatient Follow-up:  1. Please adjust medications as needed for comfort.   Diet recommendation: may have comfort feed  Activity: The patient is advised to rest.  Discharge Condition: poor  Code Status: DNR DNI/ comfort care, transition to inpatient hospice  History of present illness: As per the H and P dictated on admission, "75yo male with hx dementia, DM, previous CVA with residual L sided weakness initially admitted to [facility] 6/7 after a fall at home with L femoral neck fx which was surgically repaired. Post op patient was unable to wean from vent. Pt had progressive renal failure, volume overload and was tx [facility] for ongoing care and renal eval. "  Hospital Course:  Pt. with PMH of CVA with residual left-sided weakness, dementia, diabetes mellitus; admitted on 01/24/2016, with complaint of ventilator dependent respiratory failure, patient was initially admitted in [facility] on 01/21/2016 after a fall at home with the left humeral neck fracture. Pt underwent surgical repair of the hip at  [place] [facility]. Postoperatively the patient was unable to be extubated and had progressive renal failure with volume overload and therefore was transferred to [facility] on 01/24/2016. Patient received IV antibiotics and with improvement in oxygenation and he was extubated on 01/26/2016. Patient was transferred to hospitalist service and completed antibiotic course on 01/30/2016. 01/31/2016 the patient become febrile with worsening oxygenation with acute hypoxic respiratory failure and was transferred to step down unit on 02/01/2016 with BiPAP support. Pt has been requiring high flow nasal cannula despite aggressive treatment with chest PT vest, antibiotics. Pt has been failing speech swallow evaluation as well without any improvement in mentation. After a family meeting it was decided that the pt's care will be transitioned to comfort care with transfer to in patient hospice facility to continue symptoms management.  Summary of his active problems in the hospital is as following. 1. Acute respiratory failure with hypoxia (HCC) Sepsis with healthcare associated pneumonia. Patient completed 7 days of IV antibiotics, initially was able to be extubated. With worsening respiratory status transferred back to step down unit. Treated with BIPAP and chest PT vest. Supported with high flow nasal canula, after able to remain on nasal cannula for 1 day pt went back to high flow nasal cannula on 02/11/2016. Second time pt was on aztreonam and later on switched to Primaxin. Last day of antibiotic 02/10/2016. MRSA negative, blood cultures negative, urine culture negative.  2. Acute kidney injury. Hypernatremia. Appreciate nephrology consultation. Probably hemodynamically mediated with hypotension as well as enalapril. Worsen with postoperative hypovolemia. Renal function improving, sodium level gradually getting better.   3. Diabetes mellitus with hyperglycemia. Patient currently  nothing by  mouth with concern of aspiration.  4. Dysphagia. Protein calorie malnutrition in the context of critical illness. Per request of family tube feeding was initiated with nasogastric tube. Tube feeding change from Glucerna to vital AF Failed MBS and at that point after a family discussion with palliative care it was decide by family to transition to complete comfort with in patient hospice.  5. Left femoral neck fracture. Surgically repaired on 01/21/2016.  6. History of CVA with right-sided weakness. On aggrenox.   7. Vascular dementia. Continue patch. Also holding Seroquel for depression.  8. Scrotal ulcer. From severe edema. Also worsening from diarrhea. Use air mattress.  Pt was seen by palliative care, who recommended in patient hospice,which was arranged by Child psychotherapist and case Production designer, theatre/television/film.  Procedures and Results:  Extubation  Echocardiogram  Study Conclusions  - Left ventricle: Overall EF preserved bout apical  aneurysm/ballooning No obvious mural apical thrombus. The cavity  size was normal. There was mild focal basal hypertrophy of the  septum. The estimated ejection fraction was 55%. Wall motion was  normal; there were no regional wall motion abnormalities. - Left atrium: The atrium was mildly dilated. - Atrial septum: No defect or patent foramen ovale was identified  Consultations:  PCCM primary admission  Nephrology  Palliative care.  DISCHARGE MEDICATION: Current Discharge Medication List    START taking these medications   Details  ipratropium-albuterol (DUONEB) 0.5-2.5 (3) MG/3ML SOLN Take 3 mLs by nebulization every 4 (four) hours as needed. Qty: 360 mL    morphine 2 MG/ML injection Inject 0.5-1 mLs (1-2 mg total) into the vein every 15 (fifteen) minutes as needed (Dyspnea). Qty: 1 mL, Refills: 0      CONTINUE these medications which have NOT CHANGED   Details  pantoprazole (PROTONIX) 40 MG tablet Take 40 mg by mouth daily.    Polyvinyl  Alcohol-Povidone (REFRESH OP) Place 1 drop into both eyes 3 (three) times daily.    QUEtiapine (SEROQUEL) 25 MG tablet Take 25 mg by mouth daily at 12 noon.    Rivastigmine 13.3 MG/24HR PT24 Take 13.3 mg by mouth daily. Exelon      STOP taking these medications     citalopram (CELEXA) 40 MG tablet      dipyridamole-aspirin (AGGRENOX) 200-25 MG 12hr capsule      enalapril (VASOTEC) 20 MG tablet      furosemide (LASIX) 20 MG tablet      glimepiride (AMARYL) 2 MG tablet      insulin glargine (LANTUS) 100 UNIT/ML injection      isosorbide mononitrate (IMDUR) 30 MG 24 hr tablet      memantine (NAMENDA XR) 28 MG CP24 24 hr capsule      metFORMIN (GLUCOPHAGE) 500 MG tablet      metoprolol tartrate (LOPRESSOR) 25 MG tablet      niacin (SLO-NIACIN) 500 MG tablet      pravastatin (PRAVACHOL) 40 MG tablet      tamsulosin (FLOMAX) 0.4 MG CAPS capsule        Allergies  Allergen Reactions  . Penicillins Hives    Early childhood reaction - no other information available   Discharge Instructions    May have comfort feeds from floor stock    Complete by:  As directed           Discharge Exam: Filed Weights   02/09/16 0800 02/10/16 0459 02/11/16 0500  Weight: 99.791 kg (220 lb) 100.699 kg (222 lb) 101.152 kg (223 lb)  Filed Vitals:   02/11/16 0748 02/11/16 1139  BP: 120/65 127/67  Pulse: 71 73  Temp: 97.7 F (36.5 C) 97.9 F (36.6 C)  Resp: 23 21   General: Appear in moderate distress, no Rash, scrotal ulcer; Oral Mucosa dry. Cardiovascular: S1 and S2 Present, aortic systolic Murmur, difficult to assess JVD Respiratory: Bilateral Air entry present and bilateral Crackles, Occasional wheezes Abdomen: Bowel Sound present, Soft and no tenderness Extremities: no Pedal edema, no calf tenderness Neurology: confused and lethargic  The results of significant diagnostics from this hospitalization (including imaging, microbiology, ancillary and laboratory) are listed below  for reference.    Significant Diagnostic Studies: US Renal  02/03/2016  CLINICAL DATA:  Acute renal insufficiency; history of diabetes and hypertension. EXAM: RENAL / URINARY TRACT ULTRASOUND COMPLETE COMPARISON:  None in PACs FINDINGS: Right Kidney: Length: 13.1 cm. The renal cortical echotexture is increased as compared to the liver. There is no focal mass. There is no hydronephrosis. Left Kidney: Length: 12.9 cm. The renal cortical echotexture is mildly increased similar to that on the right. There is no hydronephrosis. Bladder: The partially distended urinary bladder is normal. IMPRESSION: Increased renal cortical echotexture consistent with medical renal disease. There is no hydronephrosis. Electronically Signed   By: David  [place]and M.D.   On: 02/03/2016 12:01   Dg Chest Port 1 View  02/08/2016  CLINICAL DATA:  Follow-up pneumonia, stroke EXAM: PORTABLE CHEST 1 VIEW COMPARISON:  02/05/2016 FINDINGS: Cardiomegaly again noted. There is NG feeding tube with tip in proximal duodenum. No pulmonary edema. Persistent bilateral basilar streaky atelectasis or infiltrate. IMPRESSION: Persistent bilateral basilar streaky atelectasis or infiltrate. NG feeding tube in place. Electronically Signed   By: Natasha Mead M.D.   On: 02/08/2016 09:11   Dg Chest Port 1 View  02/05/2016  CLINICAL DATA:  Acute respiratory failure EXAM: PORTABLE CHEST 1 VIEW COMPARISON:  02/04/2016 FINDINGS: Improved lung volumes. Persistent bibasilar airspace disease similar to yesterday. No pleural effusion. Pulmonary vascularity appears normal. IMPRESSION: Improved lung volume with persistent bibasilar atelectasis/pneumonia. Electronically Signed   By: Marlan Palau M.D.   On: 02/05/2016 07:04   Dg Chest Port 1 View  02/04/2016  CLINICAL DATA:  Congestive heart failure. EXAM: PORTABLE CHEST 1 VIEW COMPARISON:  Radiograph of February 02, 2016. FINDINGS: Stable cardiomegaly. No pneumothorax is noted. No significant pleural effusion is noted.  Stable bibasilar opacities are noted concerning for subsegmental atelectasis or possibly infiltrates. Bony thorax is unremarkable. IMPRESSION: Stable bibasilar opacities are noted concerning for subsegmental atelectasis or possibly infiltrates. Electronically Signed   By: Lupita Raider, M.D.   On: 02/04/2016 10:19   Dg Chest Port 1 View  02/02/2016  CLINICAL DATA:  75 year old male with respiratory distress and chest congestion. Initial encounter. EXAM: PORTABLE CHEST 1 VIEW COMPARISON:  02/01/2016 and earlier. FINDINGS: Portable AP semi upright view at 1318 hours. The patient is mildly rotated to the right. Mediastinal contours are stable and within normal limits. No pneumothorax. No pulmonary edema or definite effusion. Dense retrocardiac opacity persists with suggestion of air bronchograms, and is mostly obscuring the left hemidiaphragm. This appears stable since 01/31/2016, and improved since 01/26/2016. IMPRESSION: Continued left lower lobe consolidation compatible with pneumonia, stable since 01/31/2016. No definite pleural effusion. Electronically Signed   By: Odessa Fleming M.D.   On: 02/02/2016 12:50   Dg Chest Port 1 View  02/01/2016  CLINICAL DATA:  Patient with history of pneumonia. Respiratory distress. EXAM: PORTABLE CHEST 1 VIEW COMPARISON:  Chest radiograph 01/31/2016.  FINDINGS: Monitoring leads overlie the patient. Stable cardiomegaly. Unchanged mid lower lung perihilar interstitial pulmonary opacities. More focal consolidation left lower lung. Small left pleural effusion. No definite pneumothorax. IMPRESSION: Mid and lower lung perihilar interstitial opacities may represent pulmonary edema in the appropriate clinical setting. More focal consolidation left lower lung may represent atelectasis or infection. Small left pleural effusion. Electronically Signed   By: Annia Beltrew  Davis M.D.   On: 02/01/2016 14:57   Dg Chest Port 1 View  01/31/2016  CLINICAL DATA:  Fever.  Shortness of breath. EXAM: PORTABLE  CHEST 1 VIEW COMPARISON:  01/26/2016 chest radiograph. FINDINGS: Stable cardiomediastinal silhouette with mild cardiomegaly. No pneumothorax. Stable small left pleural effusion. Stable patchy opacities at the left greater than right lung bases. Stable diffuse linear parahilar interstitial opacities suggesting mild pulmonary edema. IMPRESSION: 1. Stable mild congestive heart failure and small left pleural effusion . 2. Stable patchy bibasilar lung opacities, left greater than right, favor atelectasis, cannot exclude a component of pneumonia or aspiration. Electronically Signed   By: Delbert PhenixJason A Poff M.D.   On: 01/31/2016 11:55   Dg Chest Port 1 View  01/26/2016  CLINICAL DATA:  Respiratory failure. EXAM: PORTABLE CHEST 1 VIEW COMPARISON:  01/25/2016. FINDINGS: Endotracheal and NG tube in stable position. Cardiomegaly with pulmonary vascular congestion and interstitial prominence along the left side pleural effusion no suggesting congestive heart failure. Persistent infiltrate next right mid lung again noted. Continued follow-up exams again suggested to demonstrate clearing. No pneumothorax. IMPRESSION: 1. Lines and tubes in stable position. 2. Persistent infiltrate right mid lung again noted. Continued follow-up chest x-rays to demonstrate clearing suggested. 3. Cardiomegaly with new onset pulmonary venous congestion and bilateral interstitial prominence along with left-sided pleural effusion. Findings consistent with congestive heart failure. Electronically Signed   By: Maisie Fushomas  Register   On: 01/26/2016 06:52   Dg Chest Port 1 View  01/25/2016  CLINICAL DATA:  Fever. EXAM: PORTABLE CHEST 1 VIEW COMPARISON:  January 24, 2016 FINDINGS: Stable support apparatus. No pneumothorax. The focal opacity in the medial right lung base has resolved. Minimal opacity in the bases may simply represent atelectasis. No other changes. IMPRESSION: Resolution of the focal opacity in the medial right lung base. Minimal residual opacity in  the bases, likely atelectasis. Electronically Signed   By: Gerome Samavid  Williams III M.D   On: 01/25/2016 08:31   Dg Chest Port 1 View  01/24/2016  CLINICAL DATA:  Respiratory failure. EXAM: PORTABLE CHEST 1 VIEW COMPARISON:  None. FINDINGS: Endotracheal tube in satisfactory position. Nasogastric tube extending into the stomach. Mildly enlarged cardiac silhouette. Patchy opacity in the right mid to lower lung zone, medially. There is also airspace opacity in the left lower lobe. Possible small left pleural effusion. Diffuse osteopenia. IMPRESSION: 1. Probable pneumonia in the right mid to lower lung zone, medially. A mass cannot be excluded and this will require follow-up. 2. Left lower lobe atelectasis or pneumonia. 3. Possible small left pleural effusion. 4. Mild cardiomegaly. Electronically Signed   By: Beckie SaltsSteven  Reid M.D.   On: 01/24/2016 17:08   Dg Abd Portable 1v  02/06/2016  CLINICAL DATA:  Feeding tube placement EXAM: PORTABLE ABDOMEN - 1 VIEW COMPARISON:  Portable exam 1605 hours compared to 1339 hours FINDINGS: Tip of feeding tube projects over approximately the junction of the second and third portions of the duodenum. Bowel gas pattern normal. Bones demineralized. Surgical clips RIGHT upper quadrant question cholecystectomy. IMPRESSION: Tip of feeding tube projects over approximately the junction of the second and  third portions of the duodenum. Electronically Signed   By: Ulyses Southward M.D.   On: 02/06/2016 16:12   Dg Abd Portable 1v  02/06/2016  CLINICAL DATA:  Nasogastric tube placement. EXAM: PORTABLE ABDOMEN - 1 VIEW COMPARISON:  Chest radiograph of 1 day prior FINDINGS: Feeding tube terminates at the gastric antrum. Cholecystectomy. Bibasilar airspace disease with right hemidiaphragm elevation. IMPRESSION: Feeding tube terminating at the gastric antrum. Electronically Signed   By: Jeronimo Greaves M.D.   On: 02/06/2016 13:48   Dg Swallowing Func-speech Pathology  02/10/2016  Objective Swallowing  Evaluation: Type of Study: MBS-Modified Barium Swallow Study Patient Details Name: Quintavis [employer]ands MRN: [id number] Date of Birth: [birth year] Today's Date: 02/10/2016 Time: SLP Start Time (ACUTE ONLY): 1350-SLP Stop Time (ACUTE ONLY): 1420 SLP Time Calculation (min) (ACUTE ONLY): 30 min Past Medical History: Past Medical History Diagnosis Date . High blood pressure  . GERD (gastroesophageal reflux disease)  . Bruises easily  . Anxiety  . Hearing loss  . Diabetes mellitus  . Stroke (HCC)  . Heart attack [facility])  Past Surgical History: Past Surgical History Procedure Laterality Date . Gallbladder surgery   HPI: 75 yo male with hx dementia, DM, previous CVA with residual L sided weakness initially admitted to [facility] 6/7 after a fall at home with L femoral neck fx which was surgically repaired. Post op patient was unable to wean from vent. Pt had progressive renal failure, volume overload and was tx to [facility] 6/10 for ongoing care and renal eval. Intubated 6/10-6/12.  Swallow evaluation 6/13 with recs for dysphagia 1, thin liquids and anticipation of D/C on same diet. On 6/17 patient became febrile with possible aspiration pneumonia and on 6/18 became septic and went into acute hypoxic respiratory failure requiting NRB and transfer to stepdown unit. New orders for swallow evaluation 6/20 given concerns for aspiration.  Subjective: confused - at baseline per family Assessment / Plan / Recommendation CHL IP CLINICAL IMPRESSIONS 02/10/2016 Therapy Diagnosis Moderate pharyngeal phase dysphagia;Moderate oral phase dysphagia Clinical Impression Pt presents with a mod-severe generalized dysphagia with poor oral control and propulsion; delayed swallow with sluggish mobility of hyolaryngeal musculature; significant pharyngeal retention post-swallow; aspiration of nectar-thick liquids upon the swallow and frank penetration with trace aspiration of thicker residuals after the swallow.  Aspiration did not consistently elicit a cough  response. Pt had difficulty following commands for head postures/strategies to improve airway protection.  He was less alert this afternoon than during earlier session, but study reflects performance during episodes of fatigue.  Recommend NPO with trial POs with SLP pending GOC meeting. Impact on safety and function Severe aspiration risk   CHL IP TREATMENT RECOMMENDATION 02/03/2016 Treatment Recommendations Therapy as outlined in treatment plan below   Prognosis 02/03/2016 Prognosis for Safe Diet Advancement Fair Barriers to Reach Goals Cognitive deficits Barriers/Prognosis Comment -- CHL IP DIET RECOMMENDATION 02/10/2016 SLP Diet Recommendations NPO Liquid Administration via -- Medication Administration -- Compensations -- Postural Changes --   CHL IP OTHER RECOMMENDATIONS 02/10/2016 Recommended Consults -- Oral Care Recommendations Oral care QID Other Recommendations --   CHL IP FOLLOW UP RECOMMENDATIONS 02/10/2016 Follow up Recommendations Skilled Nursing facility   [facility] IP FREQUENCY AND DURATION 02/03/2016 Speech Therapy Frequency (ACUTE ONLY) min 2x/week Treatment Duration 1 week      CHL IP ORAL PHASE 02/10/2016 Oral Phase Impaired Oral - honey Teaspoon -- Oral - Pudding Cup -- Oral - honeyTeaspoon Incomplete tongue to palate contact;Reduced posterior propulsion;Delayed oral transit;Weak lingual manipulation Oral - Honey Cup --  Oral - Nectar Teaspoon Incomplete tongue to palate contact;Reduced posterior propulsion;Delayed oral transit;Weak lingual manipulation Oral - Nectar Cup -- Oral - Nectar Straw -- Oral - Thin Teaspoon -- Oral - Thin Cup -- Oral - Thin Straw -- Oral - Puree Incomplete tongue to palate contact;Reduced posterior propulsion;Delayed oral transit;Weak lingual manipulation Oral - Mech Soft -- Oral - Regular -- Oral - Multi-Consistency -- Oral - Pill -- Oral Phase - Comment --  CHL IP PHARYNGEAL PHASE 02/10/2016 Pharyngeal Phase Impaired Pharyngeal- Pudding Teaspoon -- Pharyngeal -- Pharyngeal- Pudding  Cup -- Pharyngeal -- Pharyngeal- Honey Teaspoon -- Pharyngeal -- Pharyngeal- Honey Cup -- Pharyngeal -- Pharyngeal- Nectar Teaspoon -- Pharyngeal -- Pharyngeal- Nectar Cup -- Pharyngeal -- Pharyngeal- Nectar Straw -- Pharyngeal -- Pharyngeal- Thin Teaspoon -- Pharyngeal -- Pharyngeal- Thin Cup -- Pharyngeal -- Pharyngeal- Thin Straw -- Pharyngeal -- Pharyngeal- Puree -- Pharyngeal -- Pharyngeal- Mechanical Soft -- Pharyngeal -- Pharyngeal- Regular -- Pharyngeal -- Pharyngeal- Multi-consistency -- Pharyngeal -- Pharyngeal- Pill -- Pharyngeal -- Pharyngeal Comment --  CHL IP CERVICAL ESOPHAGEAL PHASE 02/10/2016 Cervical Esophageal Phase (No Data) Pudding Teaspoon -- Pudding Cup -- Honey Teaspoon -- Honey Cup -- Nectar Teaspoon -- Nectar Cup -- Nectar Straw -- Thin Teaspoon -- Thin Cup -- Thin Straw -- Puree -- Mechanical Soft -- Regular -- Multi-consistency -- Pill -- Cervical Esophageal Comment -- No flowsheet data found. Blenda MountsCouture, Amanda Laurice 02/10/2016, 2:53 PM              Microbiology: No results found for this or any previous visit (from the past 240 hour(s)).   Labs: CBC:  Recent Labs Lab 02/05/16 0244 02/06/16 0340 02/07/16 0427 02/09/16 0344 02/10/16 0303  WBC 12.3* 13.4* 11.7* 9.3 8.9  NEUTROABS 10.1*  --  9.6*  --   --   HGB 9.5* 9.6* 9.6* 8.6* 9.1*  HCT 31.1* 32.4* 31.5* 29.6* 31.2*  MCV 94.8 97.0 100.0 98.0 98.4  PLT 187 225 236 243 252   Basic Metabolic Panel:  Recent Labs Lab 02/05/16 0244  02/07/16 0423 02/07/16 0427 02/08/16 0212 02/09/16 0344 02/10/16 0303 02/10/16 2315 02/11/16 0500  NA 154*  < >  --  158* 156* 151* 151*  --  149*  K 3.6  < >  --  3.7 3.6 4.0 4.6 5.0 4.7  CL 122*  < >  --  >130* 130* 124* 122*  --  122*  CO2 22  < >  --  23 23 23 25   --  23  GLUCOSE 188*  < >  --  225* 208* 238* 195*  --  145*  BUN 47*  < >  --  41* 38* 34* 30*  --  27*  CREATININE 3.00*  < >  --  2.20* 1.90* 1.59* 1.45*  --  1.27*  CALCIUM 7.7*  < >  --  7.7* 7.5* 7.3* 7.7*   --  7.8*  MG 2.4  --   --  2.6* 2.7*  --   --  2.7* 2.6*  PHOS 3.7  < > 3.1  --  3.6 3.3 3.6  --  4.1  < > = values in this interval not displayed. Liver Function Tests:  Recent Labs Lab 02/06/16 0345 02/08/16 0212 02/09/16 0344 02/10/16 0303 02/11/16 0500  ALBUMIN 1.6* 1.5* 1.5* 1.5* 1.6*   No results for input(s): LIPASE, AMYLASE in the last 168 hours. No results for input(s): AMMONIA in the last 168 hours. Cardiac Enzymes: No results for input(s): CKTOTAL, CKMB, CKMBINDEX, TROPONINI  in the last 168 hours. BNP (last 3 results)  Recent Labs  01/24/16 1633  BNP 243.6*   CBG:  Recent Labs Lab 02/10/16 1559 02/10/16 1912 02/10/16 2259 02/11/16 0312 02/11/16 0751  GLUCAP 206* 115* 101* 150* 137*   Time spent: 30 minutes  Signed:  Chavy Avera  [facility]  02/11/2016  , 11:45 AM

## 2016-03-01 ENCOUNTER — Ambulatory Visit: Payer: Medicare Other | Admitting: Podiatry

## 2016-03-16 DEATH — deceased

## 2017-12-12 IMAGING — DX DG CHEST 1V PORT
1 series · 1 of 1 positions shown · non-contrast
Comparison: None.

CLINICAL DATA: Respiratory failure.

EXAM:
PORTABLE CHEST 1 VIEW

[chest ap]
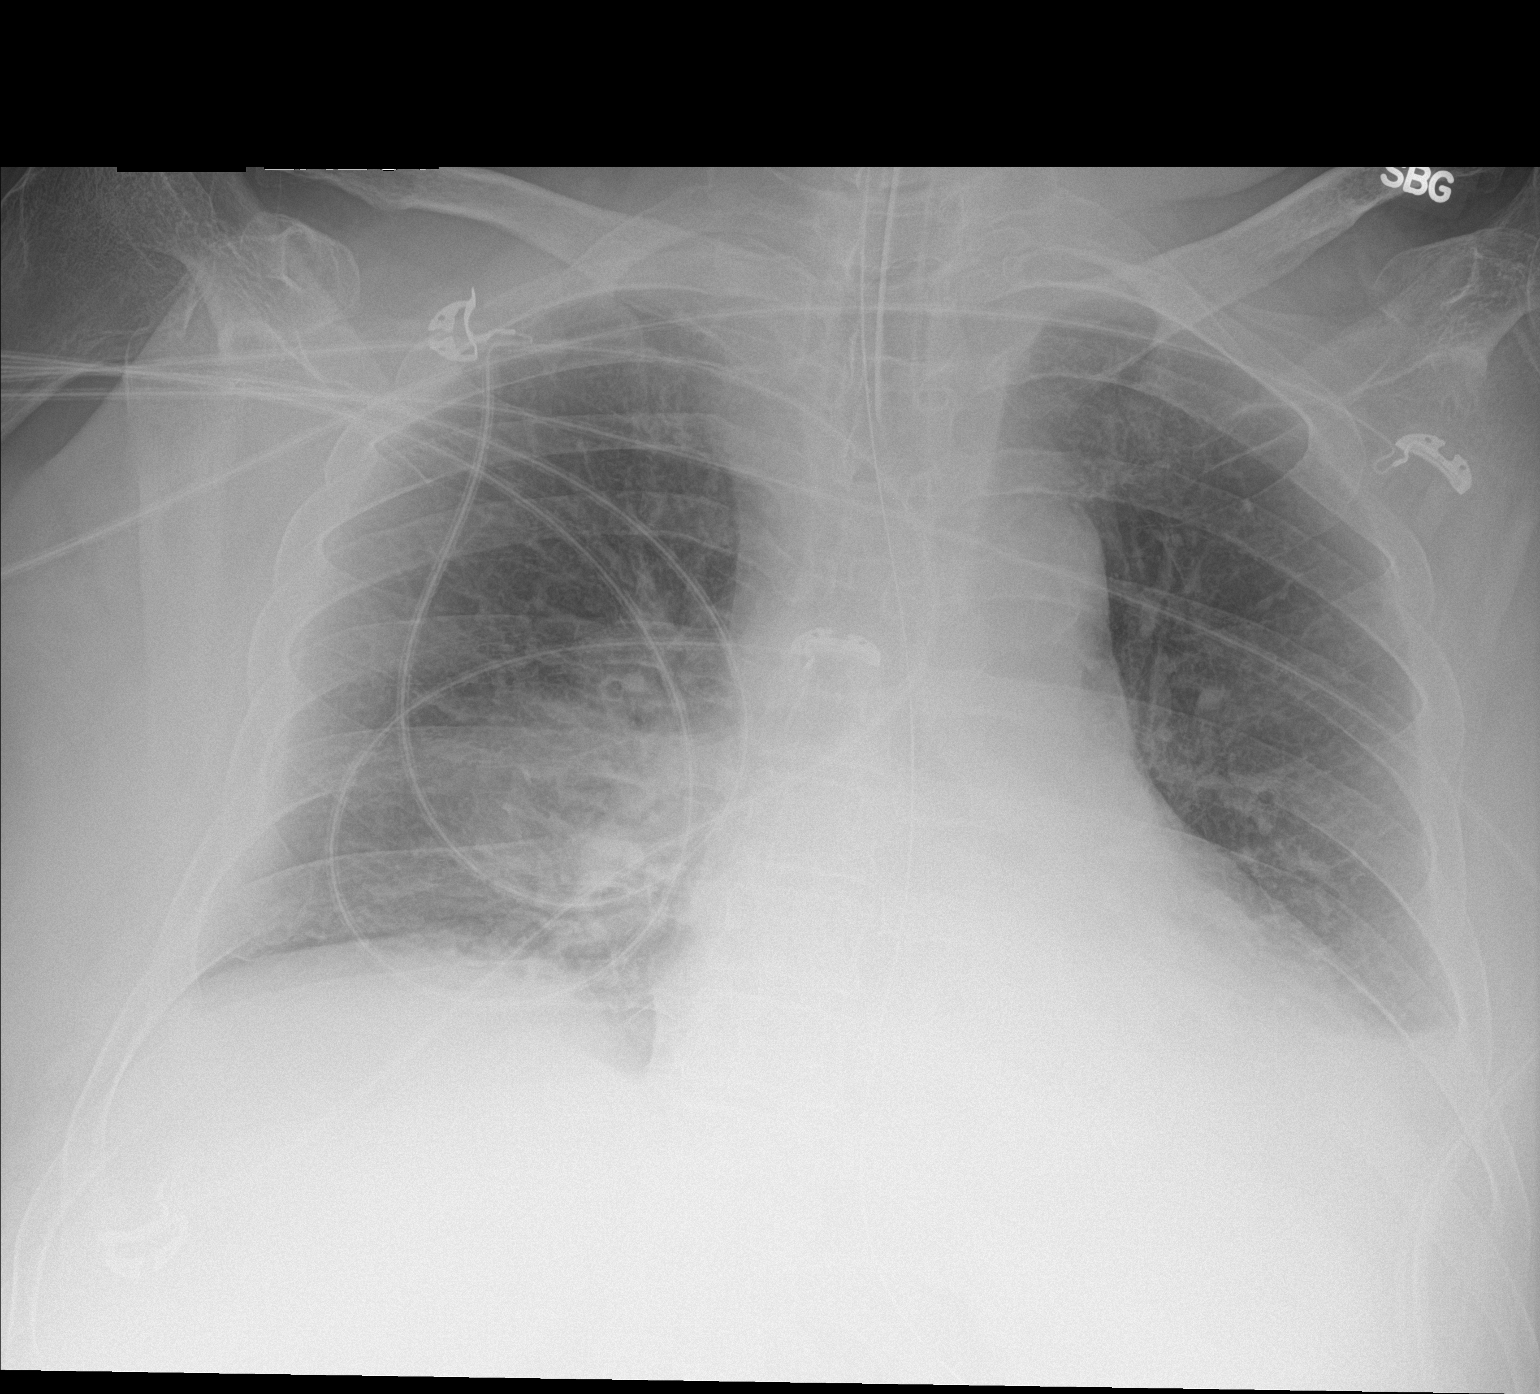

[1 of 1 positions shown; findings below may reference images not displayed]

FINDINGS: Endotracheal tube in satisfactory position. Nasogastric tube
extending into the stomach.

Mildly enlarged cardiac silhouette. Patchy opacity in the right mid
to lower lung zone, medially. There is also airspace opacity in the
left lower lobe. Possible small left pleural effusion. Diffuse
osteopenia.
IMPRESSION: 1. Probable pneumonia in the right mid to lower lung zone, medially.
A mass cannot be excluded and this will require follow-up.
2. Left lower lobe atelectasis or pneumonia.
3. Possible small left pleural effusion.
4. Mild cardiomegaly.

## 2017-12-13 IMAGING — CR DG CHEST 1V PORT
1 series · 1 of 1 positions shown · non-contrast
Comparison: January 24, 2016

CLINICAL DATA: Fever.

EXAM:
PORTABLE CHEST 1 VIEW

[ap portable]
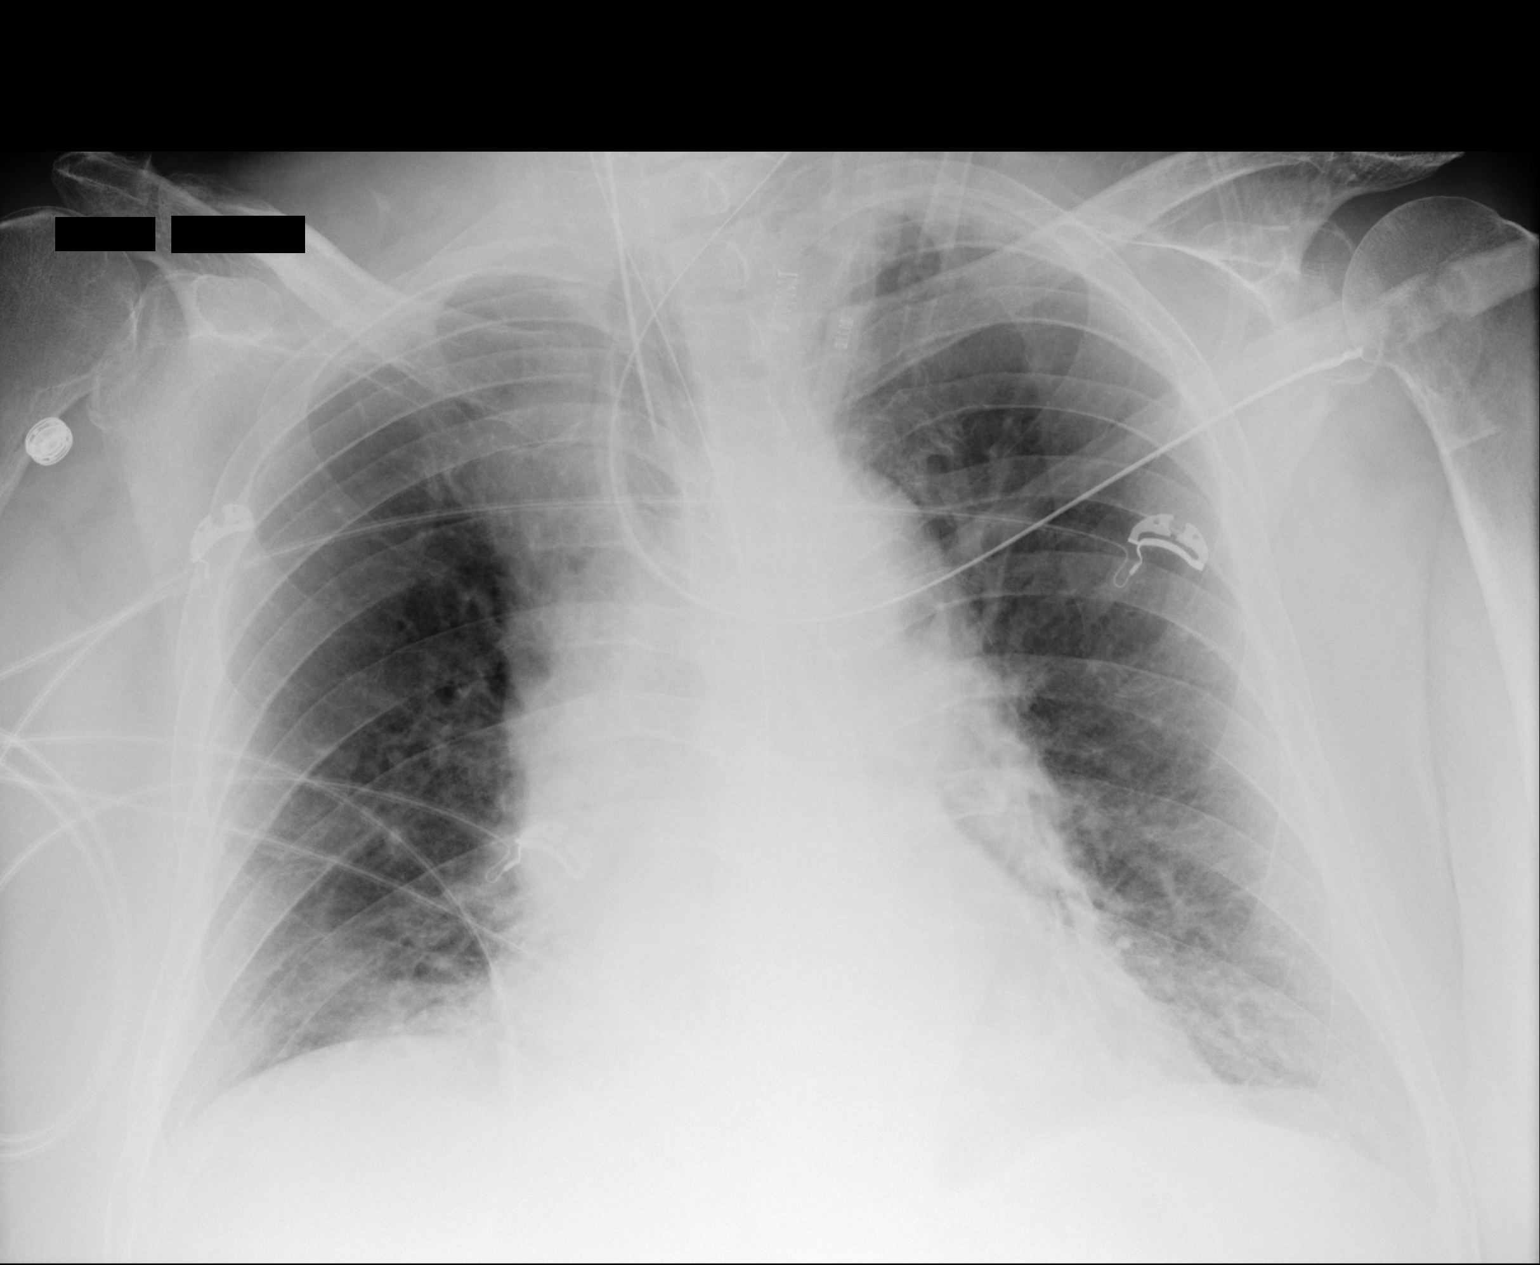

[1 of 1 positions shown; findings below may reference images not displayed]

FINDINGS: Stable support apparatus. No pneumothorax. The focal opacity in the
medial right lung base has resolved. Minimal opacity in the bases
may simply represent atelectasis. No other changes.
IMPRESSION: Resolution of the focal opacity in the medial right lung base.
Minimal residual opacity in the bases, likely atelectasis.

## 2017-12-14 IMAGING — CR DG CHEST 1V PORT
1 series · 1 of 1 positions shown · non-contrast
Comparison: 01/25/2016.

CLINICAL DATA: Respiratory failure.

EXAM:
PORTABLE CHEST 1 VIEW

[AP]
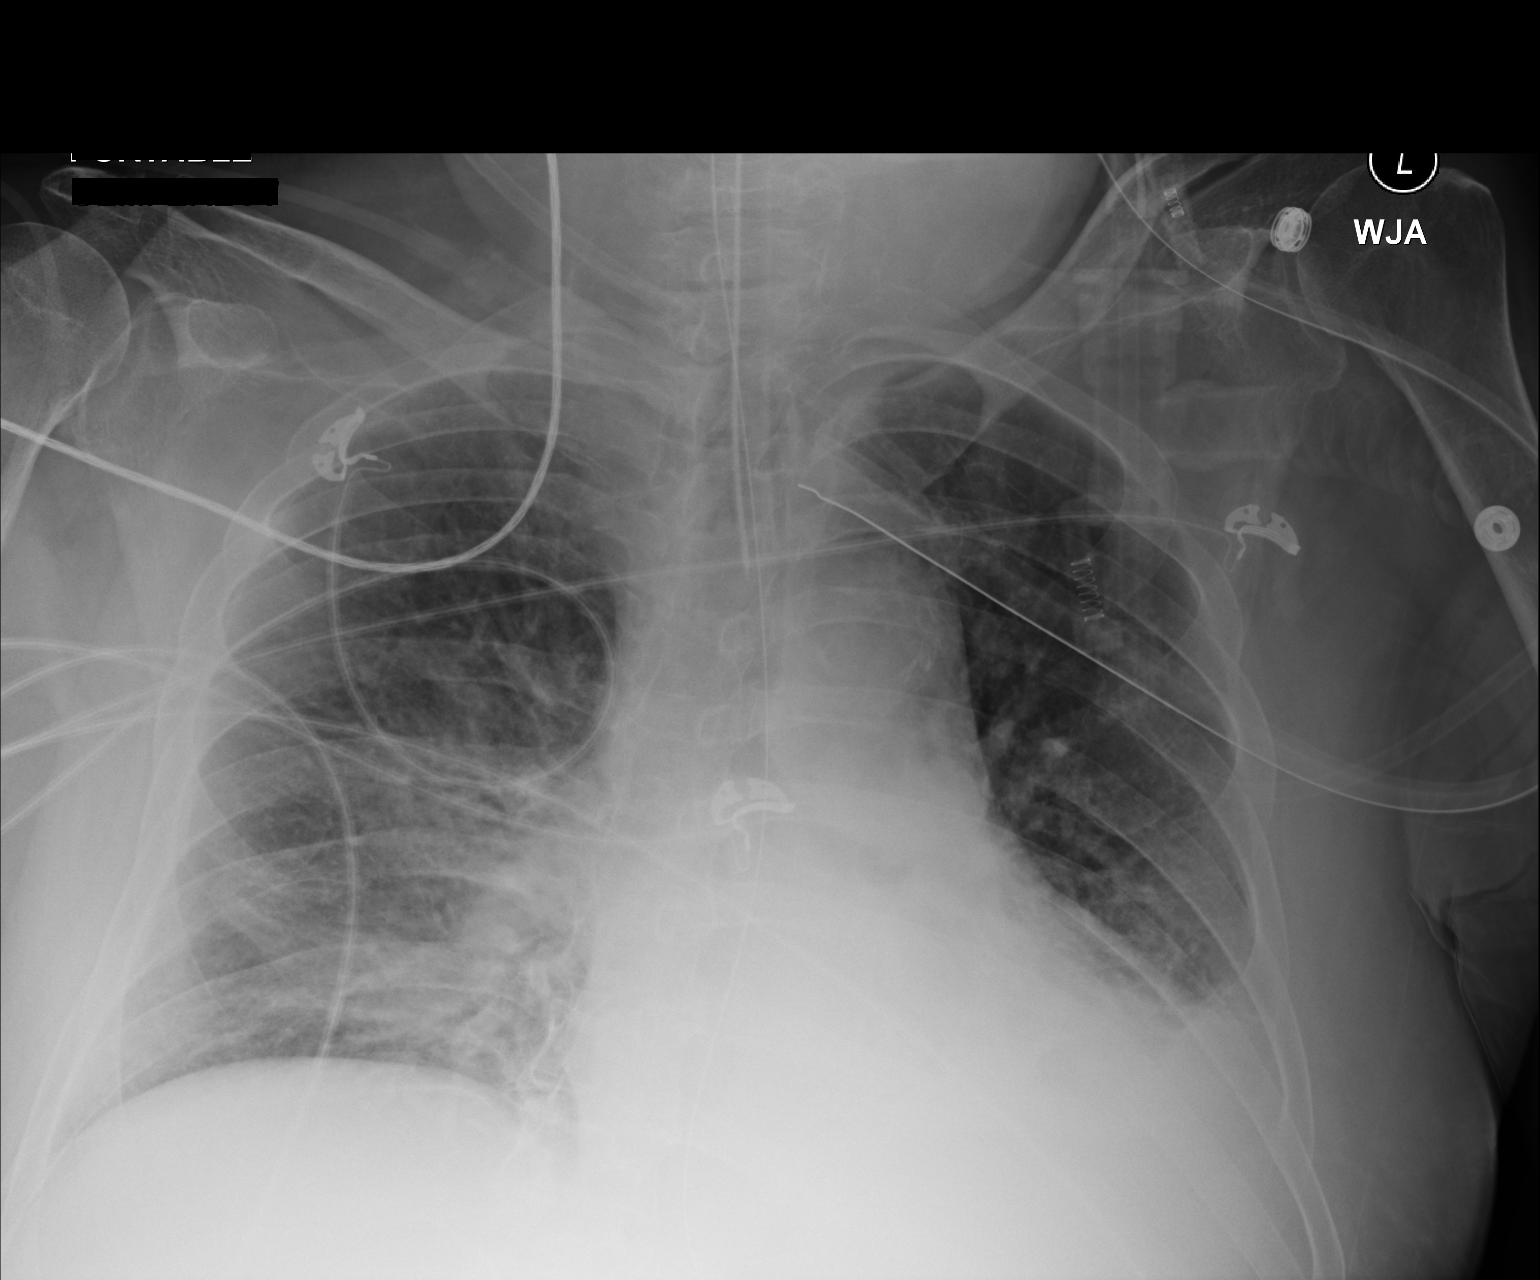

[1 of 1 positions shown; findings below may reference images not displayed]

FINDINGS: Endotracheal and NG tube in stable position. Cardiomegaly with
pulmonary vascular congestion and interstitial prominence along the
left side pleural effusion no suggesting congestive heart failure.
Persistent infiltrate next right mid lung again noted. Continued
follow-up exams again suggested to demonstrate clearing. No
pneumothorax.
IMPRESSION: 1. Lines and tubes in stable position.
2. Persistent infiltrate right mid lung again noted. Continued
follow-up chest x-rays to demonstrate clearing suggested.
3. Cardiomegaly with new onset pulmonary venous congestion and
bilateral interstitial prominence along with left-sided pleural
effusion. Findings consistent with congestive heart failure.
